# Patient Record
Sex: Female | Born: 1980 | Race: Black or African American | Hispanic: No | Marital: Single | Smoking: Never smoker
Health system: Southern US, Community
[De-identification: ages and names within clinical notes are randomized; demographics above are authoritative.]

## PROBLEM LIST (undated history)

## (undated) ENCOUNTER — Inpatient Hospital Stay (HOSPITAL_COMMUNITY): Payer: No Typology Code available for payment source

## (undated) ENCOUNTER — Inpatient Hospital Stay (HOSPITAL_COMMUNITY): Payer: Self-pay

## (undated) DIAGNOSIS — F329 Major depressive disorder, single episode, unspecified: Secondary | ICD-10-CM

## (undated) DIAGNOSIS — R87629 Unspecified abnormal cytological findings in specimens from vagina: Secondary | ICD-10-CM

## (undated) DIAGNOSIS — A609 Anogenital herpesviral infection, unspecified: Secondary | ICD-10-CM

## (undated) DIAGNOSIS — O24419 Gestational diabetes mellitus in pregnancy, unspecified control: Secondary | ICD-10-CM

## (undated) DIAGNOSIS — J45909 Unspecified asthma, uncomplicated: Secondary | ICD-10-CM

## (undated) DIAGNOSIS — R87619 Unspecified abnormal cytological findings in specimens from cervix uteri: Secondary | ICD-10-CM

## (undated) DIAGNOSIS — D649 Anemia, unspecified: Secondary | ICD-10-CM

## (undated) DIAGNOSIS — I10 Essential (primary) hypertension: Secondary | ICD-10-CM

## (undated) DIAGNOSIS — D219 Benign neoplasm of connective and other soft tissue, unspecified: Secondary | ICD-10-CM

## (undated) DIAGNOSIS — F32A Depression, unspecified: Secondary | ICD-10-CM

## (undated) DIAGNOSIS — IMO0002 Reserved for concepts with insufficient information to code with codable children: Secondary | ICD-10-CM

## (undated) DIAGNOSIS — Z8619 Personal history of other infectious and parasitic diseases: Secondary | ICD-10-CM

## (undated) DIAGNOSIS — R1110 Vomiting, unspecified: Secondary | ICD-10-CM

## (undated) DIAGNOSIS — A599 Trichomoniasis, unspecified: Secondary | ICD-10-CM

## (undated) DIAGNOSIS — J329 Chronic sinusitis, unspecified: Secondary | ICD-10-CM

## (undated) DIAGNOSIS — N390 Urinary tract infection, site not specified: Secondary | ICD-10-CM

## (undated) DIAGNOSIS — O24119 Pre-existing diabetes mellitus, type 2, in pregnancy, unspecified trimester: Secondary | ICD-10-CM

## (undated) HISTORY — DX: Gestational diabetes mellitus in pregnancy, unspecified control: O24.419

## (undated) HISTORY — DX: Vomiting, unspecified: R11.10

## (undated) HISTORY — PX: NO PAST SURGERIES: SHX2092

## (undated) HISTORY — DX: Anogenital herpesviral infection, unspecified: A60.9

## (undated) HISTORY — DX: Personal history of other infectious and parasitic diseases: Z86.19

## (undated) HISTORY — DX: Pre-existing type 2 diabetes mellitus, in pregnancy, unspecified trimester: O24.119

## (undated) HISTORY — DX: Benign neoplasm of connective and other soft tissue, unspecified: D21.9

## (undated) HISTORY — DX: Essential (primary) hypertension: I10

## (undated) HISTORY — DX: Anemia, unspecified: D64.9

## (undated) HISTORY — PX: BREAST SURGERY: SHX581

---

## 2010-09-02 NOTE — L&D Delivery Note (Signed)
Delivery Note At 2:47 AM a viable and healthy female delivered precipitously just after initial epidural dosing via Vaginal, Spontaneous Delivery (Presentation: ;  ), attended by nursing staff.   APGAR: 9, 9; weight 7 lb 2.1 oz (3235 g). Patient had unresponsive episode just after delivery, although vital signs remained stable, she had to be roused with ammonia capsule and sternal rub O2 by face mask.  I entered room just after beginning of this episode.  I requested Dr. Jean Rosenthal from anesthesia step in to assist in patient assessment--he viewed vital signs and felt patient was stable.  Episode seemed more psychogenic than physiologic.  Had increased bleeding just before placenta delivered--800 mcg cytotech placed per rectum just after delivery of placenta.  Uterus initially slightly boggy, then resolved to firm with massage, pitocin, and cytotech.    Placenta status: Intact, Spontaneous.  Cord: 3 vessels with the following complications: None.  Cord pH: NA  Vital signs remained stable before, during, and after the episode.  To recovery phase in good condition.  Nursing staff to notify Dr. Stefano Gaul of patient's delivery and status.  Anesthesia: Epidural  Episiotomy: None Lacerations: None Suture Repair: NA Est. Blood Loss (mL):   Mom to postpartum.  Baby to nursery-stable.  Roza Creamer 08/02/2011, 3:11 AM

## 2010-12-17 ENCOUNTER — Encounter (HOSPITAL_COMMUNITY): Payer: Self-pay | Admitting: Radiology

## 2010-12-17 ENCOUNTER — Inpatient Hospital Stay (HOSPITAL_COMMUNITY): Payer: Medicaid Other

## 2010-12-17 ENCOUNTER — Inpatient Hospital Stay (HOSPITAL_COMMUNITY)
Admission: AD | Admit: 2010-12-17 | Discharge: 2010-12-17 | Disposition: A | Discharge status: Home / Self Care | Payer: Medicaid Other | Source: Ambulatory Visit | Attending: Obstetrics and Gynecology | Admitting: Obstetrics and Gynecology

## 2010-12-17 DIAGNOSIS — O210 Mild hyperemesis gravidarum: Secondary | ICD-10-CM | POA: Insufficient documentation

## 2010-12-17 DIAGNOSIS — O9989 Other specified diseases and conditions complicating pregnancy, childbirth and the puerperium: Secondary | ICD-10-CM | POA: Insufficient documentation

## 2010-12-17 DIAGNOSIS — K117 Disturbances of salivary secretion: Secondary | ICD-10-CM | POA: Insufficient documentation

## 2010-12-17 DIAGNOSIS — Z3687 Encounter for antenatal screening for uncertain dates: Secondary | ICD-10-CM

## 2010-12-17 DIAGNOSIS — R112 Nausea with vomiting, unspecified: Secondary | ICD-10-CM

## 2010-12-17 LAB — URINALYSIS, ROUTINE W REFLEX MICROSCOPIC
Bilirubin Urine: NEGATIVE
Glucose, UA: NEGATIVE mg/dL
Hgb urine dipstick: NEGATIVE
Specific Gravity, Urine: 1.02 (ref 1.005–1.030)
Urobilinogen, UA: 0.2 mg/dL (ref 0.0–1.0)

## 2010-12-17 LAB — URINE MICROSCOPIC-ADD ON

## 2010-12-17 LAB — CBC
Hemoglobin: 12 g/dL (ref 12.0–15.0)
MCH: 29.4 pg (ref 26.0–34.0)
Platelets: 278 10*3/uL (ref 150–400)
RBC: 4.08 MIL/uL (ref 3.87–5.11)
WBC: 7.9 10*3/uL (ref 4.0–10.5)

## 2010-12-17 LAB — HCG, QUANTITATIVE, PREGNANCY: hCG, Beta Chain, Quant, S: 40627 m[IU]/mL — ABNORMAL HIGH (ref <5)

## 2011-01-14 LAB — GC/CHLAMYDIA PROBE AMP, GENITAL: Chlamydia: NEGATIVE

## 2011-01-14 LAB — RUBELLA ANTIBODY, IGM: Rubella: IMMUNE

## 2011-01-14 LAB — ABO/RH: RH Type: POSITIVE

## 2011-01-14 LAB — HEPATITIS B SURFACE ANTIGEN: Hepatitis B Surface Ag: NEGATIVE

## 2011-01-22 ENCOUNTER — Inpatient Hospital Stay (HOSPITAL_COMMUNITY)
Admission: AD | Admit: 2011-01-22 | Discharge: 2011-01-22 | Disposition: A | Discharge status: Home / Self Care | Payer: Medicaid Other | Source: Ambulatory Visit | Attending: Obstetrics and Gynecology | Admitting: Obstetrics and Gynecology

## 2011-01-22 DIAGNOSIS — O9989 Other specified diseases and conditions complicating pregnancy, childbirth and the puerperium: Secondary | ICD-10-CM | POA: Insufficient documentation

## 2011-01-22 DIAGNOSIS — K117 Disturbances of salivary secretion: Secondary | ICD-10-CM | POA: Insufficient documentation

## 2011-01-22 DIAGNOSIS — O210 Mild hyperemesis gravidarum: Secondary | ICD-10-CM | POA: Insufficient documentation

## 2011-01-22 LAB — URINALYSIS, ROUTINE W REFLEX MICROSCOPIC
Bilirubin Urine: NEGATIVE
Nitrite: NEGATIVE
Specific Gravity, Urine: 1.025 (ref 1.005–1.030)
Urobilinogen, UA: 0.2 mg/dL (ref 0.0–1.0)
pH: 6 (ref 5.0–8.0)

## 2011-01-22 LAB — COMPREHENSIVE METABOLIC PANEL
AST: 14 U/L (ref 0–37)
BUN: 7 mg/dL (ref 6–23)
CO2: 24 mEq/L (ref 19–32)
Calcium: 10.1 mg/dL (ref 8.4–10.5)
Chloride: 98 mEq/L (ref 96–112)
Creatinine, Ser: 0.5 mg/dL (ref 0.4–1.2)
GFR calc non Af Amer: >60 mL/min (ref >60)
Glucose, Bld: 85 mg/dL (ref 70–99)
Total Bilirubin: 0.3 mg/dL (ref 0.3–1.2)

## 2011-01-22 LAB — URINE MICROSCOPIC-ADD ON

## 2011-01-24 LAB — URINE CULTURE
Colony Count: 40000
Culture  Setup Time: 201205230622

## 2011-02-01 DIAGNOSIS — A599 Trichomoniasis, unspecified: Secondary | ICD-10-CM

## 2011-02-01 HISTORY — DX: Trichomoniasis, unspecified: A59.9

## 2011-02-02 ENCOUNTER — Inpatient Hospital Stay (HOSPITAL_COMMUNITY)
Admission: AD | Admit: 2011-02-02 | Discharge: 2011-02-02 | Disposition: A | Discharge status: Home / Self Care | Payer: Medicaid Other | Source: Ambulatory Visit | Attending: Obstetrics and Gynecology | Admitting: Obstetrics and Gynecology

## 2011-02-02 DIAGNOSIS — N949 Unspecified condition associated with female genital organs and menstrual cycle: Secondary | ICD-10-CM | POA: Insufficient documentation

## 2011-02-02 DIAGNOSIS — N390 Urinary tract infection, site not specified: Secondary | ICD-10-CM | POA: Insufficient documentation

## 2011-02-02 DIAGNOSIS — O2390 Unspecified genitourinary tract infection in pregnancy, unspecified trimester: Secondary | ICD-10-CM | POA: Insufficient documentation

## 2011-02-02 LAB — CBC
HCT: 34.8 % — ABNORMAL LOW (ref 36.0–46.0)
MCH: 29.5 pg (ref 26.0–34.0)
MCV: 86.1 fL (ref 78.0–100.0)
Platelets: 234 10*3/uL (ref 150–400)
RDW: 11.7 % (ref 11.5–15.5)
WBC: 6.3 10*3/uL (ref 4.0–10.5)

## 2011-02-02 LAB — URINALYSIS, ROUTINE W REFLEX MICROSCOPIC
Bilirubin Urine: NEGATIVE
Hgb urine dipstick: NEGATIVE
Ketones, ur: NEGATIVE mg/dL
Specific Gravity, Urine: >1.03 — ABNORMAL HIGH (ref 1.005–1.030)
pH: 6 (ref 5.0–8.0)

## 2011-02-04 LAB — URINE CULTURE: Colony Count: 40000

## 2011-02-05 ENCOUNTER — Inpatient Hospital Stay (HOSPITAL_COMMUNITY)
Admission: AD | Admit: 2011-02-05 | Payer: Medicaid Other | Source: Ambulatory Visit | Admitting: Obstetrics and Gynecology

## 2011-02-05 ENCOUNTER — Inpatient Hospital Stay (HOSPITAL_COMMUNITY)
Admission: AD | Admit: 2011-02-05 | Discharge: 2011-02-10 | DRG: 781 | Disposition: A | Discharge status: Home / Self Care | Payer: Medicaid Other | Source: Ambulatory Visit | Attending: Obstetrics and Gynecology | Admitting: Obstetrics and Gynecology

## 2011-02-05 DIAGNOSIS — O9989 Other specified diseases and conditions complicating pregnancy, childbirth and the puerperium: Secondary | ICD-10-CM | POA: Diagnosis present

## 2011-02-05 DIAGNOSIS — K117 Disturbances of salivary secretion: Secondary | ICD-10-CM | POA: Diagnosis present

## 2011-02-05 DIAGNOSIS — O210 Mild hyperemesis gravidarum: Principal | ICD-10-CM | POA: Diagnosis present

## 2011-02-05 LAB — COMPREHENSIVE METABOLIC PANEL
AST: 15 U/L (ref 0–37)
Albumin: 4 g/dL (ref 3.5–5.2)
BUN: 7 mg/dL (ref 6–23)
Calcium: 9.6 mg/dL (ref 8.4–10.5)
Creatinine, Ser: 0.59 mg/dL (ref 0.4–1.2)
GFR calc Af Amer: >60 mL/min (ref >60)
Total Protein: 7.9 g/dL (ref 6.0–8.3)

## 2011-02-05 LAB — URINE MICROSCOPIC-ADD ON

## 2011-02-05 LAB — URINALYSIS, ROUTINE W REFLEX MICROSCOPIC
Glucose, UA: >1000 mg/dL — AB
Hgb urine dipstick: NEGATIVE
Specific Gravity, Urine: 1.015 (ref 1.005–1.030)
Urobilinogen, UA: 0.2 mg/dL (ref 0.0–1.0)
pH: 8.5 — ABNORMAL HIGH (ref 5.0–8.0)

## 2011-02-05 LAB — PREALBUMIN: Prealbumin: 28 mg/dL (ref 17.0–34.0)

## 2011-02-08 LAB — GLUCOSE, CAPILLARY: Glucose-Capillary: 104 mg/dL — ABNORMAL HIGH (ref 70–99)

## 2011-02-09 LAB — BASIC METABOLIC PANEL
CO2: 24 mEq/L (ref 19–32)
Chloride: 102 mEq/L (ref 96–112)
Creatinine, Ser: 0.5 mg/dL (ref 0.4–1.2)
Glucose, Bld: 118 mg/dL — ABNORMAL HIGH (ref 70–99)

## 2011-02-10 LAB — GLUCOSE, CAPILLARY: Glucose-Capillary: 111 mg/dL — ABNORMAL HIGH (ref 70–99)

## 2011-03-07 ENCOUNTER — Inpatient Hospital Stay (HOSPITAL_COMMUNITY)
Admission: AD | Admit: 2011-03-07 | Discharge: 2011-03-08 | Disposition: A | Discharge status: Home / Self Care | Payer: Medicaid Other | Source: Ambulatory Visit | Attending: Obstetrics and Gynecology | Admitting: Obstetrics and Gynecology

## 2011-03-07 DIAGNOSIS — O210 Mild hyperemesis gravidarum: Secondary | ICD-10-CM | POA: Insufficient documentation

## 2011-03-07 LAB — URINALYSIS, ROUTINE W REFLEX MICROSCOPIC
Glucose, UA: NEGATIVE mg/dL
Hgb urine dipstick: NEGATIVE
Specific Gravity, Urine: 1.015 (ref 1.005–1.030)
Urobilinogen, UA: 0.2 mg/dL (ref 0.0–1.0)

## 2011-03-07 LAB — COMPREHENSIVE METABOLIC PANEL
ALT: 27 U/L (ref 0–35)
Alkaline Phosphatase: 44 U/L (ref 39–117)
CO2: 25 mEq/L (ref 19–32)
Chloride: 101 mEq/L (ref 96–112)
GFR calc Af Amer: >60 mL/min (ref >60)
GFR calc non Af Amer: >60 mL/min (ref >60)
Glucose, Bld: 92 mg/dL (ref 70–99)
Potassium: 3.7 mEq/L (ref 3.5–5.1)
Sodium: 135 mEq/L (ref 135–145)
Total Bilirubin: 0.1 mg/dL — ABNORMAL LOW (ref 0.3–1.2)

## 2011-03-07 LAB — CBC
Hemoglobin: 11.4 g/dL — ABNORMAL LOW (ref 12.0–15.0)
MCH: 29.9 pg (ref 26.0–34.0)
RBC: 3.81 MIL/uL — ABNORMAL LOW (ref 3.87–5.11)
WBC: 7 10*3/uL (ref 4.0–10.5)

## 2011-03-07 LAB — DIFFERENTIAL
Basophils Relative: 0 % (ref 0–1)
Monocytes Relative: 7 % (ref 3–12)
Neutro Abs: 4.3 10*3/uL (ref 1.7–7.7)
Neutrophils Relative %: 62 % (ref 43–77)

## 2011-03-13 ENCOUNTER — Encounter: Payer: Medicaid Other | Attending: Obstetrics and Gynecology

## 2011-03-13 DIAGNOSIS — O99810 Abnormal glucose complicating pregnancy: Secondary | ICD-10-CM | POA: Insufficient documentation

## 2011-03-13 DIAGNOSIS — Z713 Dietary counseling and surveillance: Secondary | ICD-10-CM | POA: Insufficient documentation

## 2011-03-14 NOTE — Progress Notes (Signed)
  Patient was seen on 03/13/2011 for Gestational Diabetes self-management class at the [facility]. The following learning objectives were met by the patient during this course:   States the definition of Gestational Diabetes  States why dietary management is important in controlling blood glucose  Describes the effects each nutrient has on blood glucose levels  Demonstrates ability to create a balanced meal plan  Demonstrates carbohydrate counting   States when to check blood glucose levels  Demonstrates proper blood glucose monitoring techniques  States the effect of stress and exercise on blood glucose levels  States the importance of limiting caffeine and abstaining from alcohol and smoking  Blood glucose monitor given: Accu-Chek-Nano Lot # [id number] Exp: 07/02/2012 Blood glucose reading: 1o8  Patient instructed to monitor glucose levels: FBS: 60 - <90 2 hour: <120  Patient will be seen for follow-up as needed.

## 2011-05-02 ENCOUNTER — Inpatient Hospital Stay (HOSPITAL_COMMUNITY)
Admission: AD | Admit: 2011-05-02 | Discharge: 2011-05-02 | Disposition: A | Discharge status: Home / Self Care | Payer: Medicaid Other | Source: Ambulatory Visit | Attending: Obstetrics and Gynecology | Admitting: Obstetrics and Gynecology

## 2011-05-02 ENCOUNTER — Encounter (HOSPITAL_COMMUNITY): Payer: Self-pay | Admitting: *Deleted

## 2011-05-02 ENCOUNTER — Inpatient Hospital Stay (HOSPITAL_COMMUNITY): Payer: Medicaid Other

## 2011-05-02 DIAGNOSIS — Y9229 Other specified public building as the place of occurrence of the external cause: Secondary | ICD-10-CM | POA: Insufficient documentation

## 2011-05-02 DIAGNOSIS — IMO0002 Reserved for concepts with insufficient information to code with codable children: Secondary | ICD-10-CM | POA: Insufficient documentation

## 2011-05-02 DIAGNOSIS — S3981XA Other specified injuries of abdomen, initial encounter: Secondary | ICD-10-CM | POA: Insufficient documentation

## 2011-05-02 DIAGNOSIS — O99810 Abnormal glucose complicating pregnancy: Secondary | ICD-10-CM | POA: Insufficient documentation

## 2011-05-02 DIAGNOSIS — Y998 Other external cause status: Secondary | ICD-10-CM | POA: Insufficient documentation

## 2011-05-02 HISTORY — DX: Gestational diabetes mellitus in pregnancy, unspecified control: O24.419

## 2011-05-02 HISTORY — DX: Trichomoniasis, unspecified: A59.9

## 2011-05-02 HISTORY — DX: Urinary tract infection, site not specified: N39.0

## 2011-05-02 LAB — URINE MICROSCOPIC-ADD ON

## 2011-05-02 LAB — URINALYSIS, ROUTINE W REFLEX MICROSCOPIC
Bilirubin Urine: NEGATIVE
Glucose, UA: NEGATIVE mg/dL
Hgb urine dipstick: NEGATIVE
Protein, ur: NEGATIVE mg/dL
Urobilinogen, UA: 0.2 mg/dL (ref 0.0–1.0)

## 2011-05-02 LAB — GLUCOSE, CAPILLARY: Glucose-Capillary: 132 mg/dL — ABNORMAL HIGH (ref 70–99)

## 2011-05-02 MED ORDER — IBUPROFEN 800 MG PO TABS
800.0000 mg | ORAL_TABLET | Freq: Once | ORAL | Status: AC
  Administered 2011-05-02: 800 mg via ORAL
  Filled 2011-05-02: qty 1

## 2011-05-02 NOTE — ED Notes (Signed)
Diet ordered 
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
## 2011-06-06 NOTE — Discharge Summary (Signed)
[name], [name] NO.:  [id number]  MEDICAL RECORD NO.:  [id number]  LOCATION:  9320                          FACILITY:  [facility]  PHYSICIAN:  [name], M.D. DATE OF BIRTH:  Apr 25, 1981  DATE OF ADMISSION:  02/05/2011 DATE OF DISCHARGE:  02/10/2011                              DISCHARGE SUMMARY   ADMITTING DIAGNOSES: 1. The patient is a 30 year old gravida 2, para 1-0-0-1 at 13 weeks 2. Exacerbation of hyperemesis. 3. Pytalism. 4. Preterm contractions  DISCHARGE DIAGNOSES: 1. Exacerbation of hyperemesis. 2. Pytalism.  HOSPITAL COURSE:  The patient was admitted to the women's floor.  She received IV fluids and antiemetics.  She also received a steroid taper.  Diet was originally n.p.o. and the patient advanced to clears and eventually to a regular diet that she is tolerating.  Antiemetics and IV fluids were helping.  LABORATORY DATA:  On admission, sodium was 128, potassium 3.3, all other levels of CMP were normal.  On February 09, 2011, sodium was 135 and potassium was 3.5.  All other levels were essentially normal.  She also had a TSH level drawn which was 1.055.  Prealbumin level that was 28, it was done on February 05, 2011 and also on February 05, 2011 she had a urinalysis that was essentially normal.  The patient had improvement every day and began to tolerate liquids and regular diet and had less nausea, vomiting, and pytalim as well as labs improving.  Vital signs remained stable.  There were no complications.  DISCHARGE DIAGNOSES: Exacerbation of hyperemesis improved and pytalism improved and the patient was discharged on February 10, 2011 in stable condition.  DISCHARGE INSTRUCTIONS:  Continue Protonix 40 mg once daily, Phenergan rectal suppository 25 mg every 6 hours as needed, Fioricet every 12 hours as needed for headaches, glycopyrrolate 1 mg daily, Zofran 8 mg every 6 hours as needed, Macrobid 1 tablet daily for 14 days.  The patient is to continue  up to 14 days.  She was on day 9 of her course.  New medications are Reglan 10 mg by mouth every 8 hours and steroid taper to be completed as written.  She is also to receive Colace b.i.d.  She is to follow up in 2 weeks as scheduled by [facility].    ______________________________ Sanda Klein, CNM   ______________________________ [name], M.D.    SL/MEDQ  D:  06/06/2011  T:  06/06/2011  Job:  [id number]

## 2011-06-10 ENCOUNTER — Other Ambulatory Visit: Payer: Self-pay | Admitting: Obstetrics and Gynecology

## 2011-06-10 ENCOUNTER — Inpatient Hospital Stay (HOSPITAL_COMMUNITY): Payer: Medicaid Other

## 2011-06-10 ENCOUNTER — Encounter (HOSPITAL_COMMUNITY): Payer: Self-pay | Admitting: *Deleted

## 2011-06-10 ENCOUNTER — Inpatient Hospital Stay (HOSPITAL_COMMUNITY)
Admission: AD | Admit: 2011-06-10 | Discharge: 2011-06-11 | DRG: 781 | Disposition: A | Discharge status: Home / Self Care | Payer: Medicaid Other | Source: Ambulatory Visit | Attending: Obstetrics and Gynecology | Admitting: Obstetrics and Gynecology

## 2011-06-10 DIAGNOSIS — O24919 Unspecified diabetes mellitus in pregnancy, unspecified trimester: Secondary | ICD-10-CM | POA: Diagnosis present

## 2011-06-10 DIAGNOSIS — O368390 Maternal care for abnormalities of the fetal heart rate or rhythm, unspecified trimester, not applicable or unspecified: Secondary | ICD-10-CM | POA: Diagnosis present

## 2011-06-10 DIAGNOSIS — E119 Type 2 diabetes mellitus without complications: Secondary | ICD-10-CM | POA: Diagnosis present

## 2011-06-10 DIAGNOSIS — O212 Late vomiting of pregnancy: Secondary | ICD-10-CM | POA: Diagnosis present

## 2011-06-10 DIAGNOSIS — O99891 Other specified diseases and conditions complicating pregnancy: Principal | ICD-10-CM | POA: Diagnosis present

## 2011-06-10 DIAGNOSIS — B9789 Other viral agents as the cause of diseases classified elsewhere: Secondary | ICD-10-CM | POA: Diagnosis present

## 2011-06-10 DIAGNOSIS — B349 Viral infection, unspecified: Secondary | ICD-10-CM

## 2011-06-10 HISTORY — DX: Chronic sinusitis, unspecified: J32.9

## 2011-06-10 LAB — CBC
HCT: 34.9 % — ABNORMAL LOW (ref 36.0–46.0)
Hemoglobin: 11.8 g/dL — ABNORMAL LOW (ref 12.0–15.0)
MCH: 29.3 pg (ref 26.0–34.0)
MCHC: 33.8 g/dL (ref 30.0–36.0)
MCV: 86.6 fL (ref 78.0–100.0)
Platelets: 235 10*3/uL (ref 150–400)
RBC: 4.03 MIL/uL (ref 3.87–5.11)
RDW: 11.8 % (ref 11.5–15.5)
WBC: 4.6 10*3/uL (ref 4.0–10.5)

## 2011-06-10 LAB — COMPREHENSIVE METABOLIC PANEL
ALT: 14 U/L (ref 0–35)
AST: 19 U/L (ref 0–37)
Albumin: 3.3 g/dL — ABNORMAL LOW (ref 3.5–5.2)
Calcium: 9.8 mg/dL (ref 8.4–10.5)
Chloride: 102 mEq/L (ref 96–112)
Creatinine, Ser: <0.47 mg/dL — ABNORMAL LOW (ref 0.50–1.10)
Sodium: 134 mEq/L — ABNORMAL LOW (ref 135–145)
Total Bilirubin: 0.2 mg/dL — ABNORMAL LOW (ref 0.3–1.2)

## 2011-06-10 LAB — URINE MICROSCOPIC-ADD ON

## 2011-06-10 LAB — URINALYSIS, ROUTINE W REFLEX MICROSCOPIC
Glucose, UA: NEGATIVE mg/dL
Hgb urine dipstick: NEGATIVE
Specific Gravity, Urine: >1.03 — ABNORMAL HIGH (ref 1.005–1.030)

## 2011-06-10 LAB — GLUCOSE, CAPILLARY: Glucose-Capillary: 86 mg/dL (ref 70–99)

## 2011-06-10 MED ORDER — LACTATED RINGERS IV SOLN
Freq: Once | INTRAVENOUS | Status: AC
  Administered 2011-06-10: 10:00:00 via INTRAVENOUS

## 2011-06-10 MED ORDER — GLYCOPYRROLATE 0.2 MG/ML IJ SOLN
0.1000 mg | Freq: Once | INTRAMUSCULAR | Status: AC
  Administered 2011-06-10: 0.1 mg via INTRAVENOUS
  Filled 2011-06-10: qty 0.5

## 2011-06-10 MED ORDER — ONDANSETRON HCL 4 MG/2ML IJ SOLN
4.0000 mg | Freq: Three times a day (TID) | INTRAMUSCULAR | Status: DC
  Administered 2011-06-10 – 2011-06-11 (×4): 4 mg via INTRAVENOUS
  Filled 2011-06-10 (×4): qty 2

## 2011-06-10 MED ORDER — PANTOPRAZOLE SODIUM 40 MG IV SOLR
40.0000 mg | INTRAVENOUS | Status: DC
  Administered 2011-06-10 – 2011-06-11 (×2): 40 mg via INTRAVENOUS
  Filled 2011-06-10 (×4): qty 40

## 2011-06-10 MED ORDER — ACETAMINOPHEN 325 MG PO TABS: 650.0000 mg | ORAL_TABLET | ORAL | Status: DC | PRN

## 2011-06-10 MED ORDER — GI COCKTAIL ~~LOC~~
30.0000 mL | Freq: Three times a day (TID) | ORAL | Status: DC | PRN
  Administered 2011-06-10 – 2011-06-11 (×2): 30 mL via ORAL
  Filled 2011-06-10 (×3): qty 30

## 2011-06-10 MED ORDER — LACTATED RINGERS IV SOLN
INTRAVENOUS | Status: DC
  Administered 2011-06-10 – 2011-06-11 (×4): via INTRAVENOUS

## 2011-06-10 MED ORDER — PRENATAL PLUS 27-1 MG PO TABS
1.0000 | ORAL_TABLET | Freq: Every day | ORAL | Status: DC
  Filled 2011-06-10 (×3): qty 1

## 2011-06-10 MED ORDER — DOCUSATE SODIUM 100 MG PO CAPS
100.0000 mg | ORAL_CAPSULE | Freq: Every day | ORAL | Status: DC
Start: 2011-06-10 — End: 2011-06-11
  Administered 2011-06-10: 100 mg via ORAL
  Filled 2011-06-10 (×4): qty 1

## 2011-06-10 MED ORDER — PROMETHAZINE HCL 25 MG/ML IJ SOLN
25.0000 mg | Freq: Four times a day (QID) | INTRAMUSCULAR | Status: DC | PRN
  Administered 2011-06-10 (×2): 25 mg via INTRAVENOUS
  Filled 2011-06-10 (×2): qty 1

## 2011-06-10 MED ORDER — ZOLPIDEM TARTRATE 10 MG PO TABS: 10.0000 mg | ORAL_TABLET | Freq: Every evening | ORAL | Status: DC | PRN

## 2011-06-10 MED ORDER — CALCIUM CARBONATE ANTACID 500 MG PO CHEW
2.0000 | CHEWABLE_TABLET | ORAL | Status: DC | PRN
  Administered 2011-06-11: 400 mg via ORAL
  Filled 2011-06-10 (×2): qty 2

## 2011-06-10 NOTE — Progress Notes (Signed)
Pt states has had n/v since 0500 this am, yellow emesis noted, had hyperemesis earlier in pregnancy, +FM, denies diarrhea, had baby shower Saturday, around a lot of people, denies vag d/c changes or bleeding, +FM.

## 2011-06-10 NOTE — Plan of Care (Signed)
Problem: Consults Goal: Birthing Suites Patient Information Press F2 to bring up selections list  Outcome: Not Applicable Date Met:  06/10/11  Pt < [redacted] weeks EGA

## 2011-06-10 NOTE — ED Provider Notes (Signed)
History    30 yo G2p1001 at 58 1/7 weeks presented with onset of nausea and vomiting at 5 am today.  Has struggled with N/V throughout the pregnancy, although last few weeks have been better.  She has Zofran and Phenergan for home use, but threw up the Zofran, and had no help from the Phenergan.  Denies fever, dysuria, bleeding, leaking, contractions, or known viral exposure, although she did have a baby shower this weekend. CBG this am 101.  Pregnancy remarkable for: Probable Type 2 Diabetes, with elevated HgA1C this pregnancy--currently monitoring CBGs at home, but on no medication at present Questionable LMP Obese  Trich on Pap Early pregnancy hyperemesis  Ptyalism  OB History    Grav Para Term Preterm Abortions TAB SAB Ect Mult Living   2 1 1  0 0 0 0 0 0 1      Past Medical History  Diagnosis Date  . Gestational diabetes   . Urinary tract infection   . Trichomonas 02/2011    treated    No past surgical history on file.  No family history on file.  History  Substance Use Topics  . Smoking status: Never Smoker   . Smokeless tobacco: Never Used  . Alcohol Use: No     Allergies  Allergen Reactions  . Flagyl (Metronidazole Hcl) Swelling  . Latex Hives    Prescriptions prior to admission  Medication Sig Dispense Refill  . glycopyrrolate (ROBINUL) 1 MG tablet Take 1 mg by mouth 3 (three) times daily.        . Multiple Vitamin (MULTIVITAMIN) tablet Take 1 tablet by mouth daily.        . ondansetron (ZOFRAN) 8 MG tablet Take 8 mg by mouth every 8 (eight) hours as needed.        . pantoprazole (PROTONIX) 40 MG tablet Take 40 mg by mouth daily.        . promethazine (PHENERGAN) 25 MG tablet Take 25 mg by mouth every 6 (six) hours as needed.          ROS:  Nausea, vomiting, reflux, HA.  Denies abdominal pain, d/c, bleeding.  Reports +FM. Physical Exam   Blood pressure 128/84, pulse 98, temperature 98 F (36.7 C), temperature source Oral, resp. rate 18, height 5\' 3"   (1.6 m), weight 99.961 kg (220 lb 6 oz).  Appears fatigued and ill Chest clear Heart RRR without murmur Abd Gravid, NT Negative CVAT FHR overall non-reactive at present, no decel Very occasional contraction. Ext WNL  ED Course  IUP at 31 1/7 weeks Nausea/vomiting--? Recurrence of hyperemesis vs. Viral syndrome Type 2 diabetes--no current meds  Plan: Check UA, CBC, CMP IV hydration with LR Zofran Robinul Protonix Monitor Consult with Dr. Normand Sloop after labs returned.  Nigel Bridgeman, CNM 06/10/11 1020

## 2011-06-10 NOTE — Progress Notes (Signed)
Now in 160.  Resting comfortably. FHR still non-reactive, baseline 150-160. BPP 6/8 (- 2 for no sustained FBM).  Filed Vitals:   06/10/11 1551  BP: 112/65  Pulse: 98  Temp: 97.7 F (36.5 C)  Resp: 18   Will continue IV hydration and continuous monitoring. Offer clear liquids as initial diet, if no further vomiting occurs.  Nigel Bridgeman, CNM 06/10/11  1635

## 2011-06-10 NOTE — H&P (Signed)
30 yo G2p1001 at 28 1/7 weeks presented with onset of nausea and vomiting at 5 am today. Has struggled with N/V throughout the pregnancy, although last few weeks have been better. She has Zofran and Phenergan for home use, but threw up the Zofran, and had no help from the Phenergan. Denies fever, dysuria, bleeding, leaking, contractions, or known viral exposure, although she did have a baby shower this weekend. CBG this am 101.   Pregnancy remarkable for:  Probable Type 2 Diabetes, with elevated HgA1C this pregnancy--currently monitoring CBGs at home, but on no medication at present  Questionable LMP  Obese  Trich on Pap  Early pregnancy hyperemesis  Ptyalism   OB History    Grav  Para  Term  Preterm  Abortions  TAB  SAB  Ect  Mult  Living    2  1  1   0  0  0  0  0  0  1      Past Medical History   Diagnosis  Date   .  Gestational diabetes    .  Urinary tract infection    .  Trichomonas  02/2011     treated    No past surgical history on file.  No family history on file.  History   Substance Use Topics   .  Smoking status:  Never Smoker   .  Smokeless tobacco:  Never Used   .  Alcohol Use:  No    Allergies   Allergen  Reactions   .  Flagyl (Metronidazole Hcl)  Swelling   .  Latex  Hives    Prescriptions prior to admission   Medication  Sig  Dispense  Refill   .  glycopyrrolate (ROBINUL) 1 MG tablet  Take 1 mg by mouth 3 (three) times daily.     .  Multiple Vitamin (MULTIVITAMIN) tablet  Take 1 tablet by mouth daily.     .  ondansetron (ZOFRAN) 8 MG tablet  Take 8 mg by mouth every 8 (eight) hours as needed.     .  pantoprazole (PROTONIX) 40 MG tablet  Take 40 mg by mouth daily.     .  promethazine (PHENERGAN) 25 MG tablet  Take 25 mg by mouth every 6 (six) hours as needed.      ROS: Nausea, vomiting, reflux, HA. Denies abdominal pain, d/c, bleeding. Reports +FM.  Physical Exam   Blood pressure 128/84, pulse 98, temperature 98 F (36.7 C), temperature source Oral, resp.  rate 18, height 5\' 3"  (1.6 m), weight 99.961 kg (220 lb 6 oz).  Appears fatigued and ill  Chest clear  Heart RRR without murmur  Abd Gravid, NT  Negative CVAT  FHR overall non-reactive at present, no decel  Very occasional contraction.  Ext WNL Cervix closed, long, vtx ballotable.  Results for orders placed during the hospital encounter of 06/10/11 (from the past 24 hour(s))  URINALYSIS, ROUTINE W REFLEX MICROSCOPIC     Status: Abnormal   Collection Time   06/10/11  9:38 AM      Component Value Range   Color, Urine YELLOW  YELLOW    Appearance HAZY (*) CLEAR    Specific Gravity, Urine >1.030 (*) 1.005 - 1.030    pH 6.5  5.0 - 8.0    Glucose, UA NEGATIVE  NEGATIVE (mg/dL)   Hgb urine dipstick NEGATIVE  NEGATIVE    Bilirubin Urine NEGATIVE  NEGATIVE    Ketones, ur NEGATIVE  NEGATIVE (mg/dL)   Protein,  ur NEGATIVE  NEGATIVE (mg/dL)   Urobilinogen, UA 1.0  0.0 - 1.0 (mg/dL)   Nitrite NEGATIVE  NEGATIVE    Leukocytes, UA TRACE (*) NEGATIVE   URINE MICROSCOPIC-ADD ON     Status: Abnormal   Collection Time   06/10/11  9:38 AM      Component Value Range   Squamous Epithelial / LPF FEW (*) RARE    WBC, UA 3-6  <3 (WBC/hpf)   Bacteria, UA FEW (*) RARE    Crystals CA OXALATE CRYSTALS (*) NEGATIVE   CBC     Status: Abnormal   Collection Time   06/10/11 10:14 AM      Component Value Range   WBC 4.6  4.0 - 10.5 (K/uL)   RBC 4.03  3.87 - 5.11 (MIL/uL)   Hemoglobin 11.8 (*) 12.0 - 15.0 (g/dL)   HCT 47.8 (*) 29.5 - 46.0 (%)   MCV 86.6  78.0 - 100.0 (fL)   MCH 29.3  26.0 - 34.0 (pg)   MCHC 33.8  30.0 - 36.0 (g/dL)   RDW 62.1  30.8 - 65.7 (%)   Platelets 235  150 - 400 (K/uL)  COMPREHENSIVE METABOLIC PANEL     Status: Abnormal   Collection Time   06/10/11 10:14 AM      Component Value Range   Sodium 134 (*) 135 - 145 (mEq/L)   Potassium 4.0  3.5 - 5.1 (mEq/L)   Chloride 102  96 - 112 (mEq/L)   CO2 21  19 - 32 (mEq/L)   Glucose, Bld 89  70 - 99 (mg/dL)   BUN 7  6 - 23 (mg/dL)    Creatinine, Ser <8.46 (*) 0.50 - 1.10 (mg/dL)   Calcium 9.8  8.4 - 96.2 (mg/dL)   Total Protein 6.7  6.0 - 8.3 (g/dL)   Albumin 3.3 (*) 3.5 - 5.2 (g/dL)   AST 19  0 - 37 (U/L)   ALT 14  0 - 35 (U/L)   Alkaline Phosphatase 67  39 - 117 (U/L)   Total Bilirubin 0.2 (*) 0.3 - 1.2 (mg/dL)   GFR calc non Af Amer NOT CALCULATED  >90 (mL/min)   GFR calc Af Amer NOT CALCULATED  >90 (mL/min)   Received Zofran, Protonix, Robinul, IV hydration. Still feels ill and nauseated. Single episode loose BM.  Impression/Plan: IUP at 31 1/7 weeks Probable viral syndrome Type 2 diabetes Non-reactive FHR  Will admit for observation per consult with Dr. Normand Sloop Continue IV hydration, anti-emetics, reflux meds Check BPP FBS and 2 hour PCs if eating, q 4 hours if not. May change to q shift monitoring if BPP reassuring. Daily weights Stool for c. Difficle, O&P if diarrhea occurs.  Nigel Bridgeman, CNM 06/10/11 1435

## 2011-06-10 NOTE — Progress Notes (Addendum)
  Subjective: Tolerated a clear liquid diet for dinner without active vomiting, still reports nausea.   Requested Phenergan before bed.  Objective: BP 109/62  Pulse 91  Temp(Src) 97.7 F (36.5 C) (Oral)  Resp 18  Ht 5\' 3"  (1.6 m)  Wt 99.961 kg (220 lb 6 oz)  BMI 39.04 kg/m2      FHT:  FHR: 150-160 bpm, variability: minimal ,  accelerations:  Abscent,  decelerations:  Absent UC:   None  CBG 86 at 2108  Assessment / Plan: Nausea and vomiting, ? viral syndrome Non-reactive NST, with BPP 6/8  Plan: Continue to monitor throughout night--if has segments of reactivity, will change to TID monitoring for sleep. Advance diet as tolerated Re-evaluate in am or prn. Per consult with Dr. Normand Sloop, plan repeat BPP in am.   Nigel Bridgeman 06/10/2011, 9:11 PM

## 2011-06-11 ENCOUNTER — Inpatient Hospital Stay (HOSPITAL_COMMUNITY): Payer: Medicaid Other

## 2011-06-11 MED ORDER — FAMOTIDINE 20 MG PO TABS
20.0000 mg | ORAL_TABLET | Freq: Two times a day (BID) | ORAL | Status: DC
  Administered 2011-06-11: 20 mg via ORAL
  Filled 2011-06-11: qty 1

## 2011-06-11 MED ORDER — RANITIDINE HCL 150 MG/10ML PO SYRP: 150.0000 mg | ORAL_SOLUTION | Freq: Two times a day (BID) | ORAL | Status: AC | PRN

## 2011-06-11 MED ORDER — CALCIUM CARBONATE ANTACID 500 MG PO CHEW: 2.0000 | CHEWABLE_TABLET | Freq: Three times a day (TID) | ORAL | Status: DC

## 2011-06-11 MED ORDER — RANITIDINE HCL 150 MG/10ML PO SYRP: 150.0000 mg | ORAL_SOLUTION | Freq: Two times a day (BID) | ORAL | Status: DC

## 2011-06-11 NOTE — Progress Notes (Signed)
Kalah Pflum is a 30 y.o. G2P1001 at [redacted]w[redacted]d admitted for n/v/d.  Subjective: Pt c/o GERD.  She tolerated po last night and is requesting bland diet today.  Reports pos FM, no VB, no abnormal d/c, no LOF and no contractions.   Objective: BP 108/52  Pulse 78  Temp(Src) 97.7 F (36.5 C) (Oral)  Resp 18  Ht 5\' 3"  (1.6 m)  Wt 99.961 kg (220 lb 6 oz)  BMI 39.04 kg/m2      FHT:  FHR: 140s bpm, variability: moderate,  accelerations:  Present (occas),  decelerations:  Absent UC:   none SVE:   Dilation: Closed Effacement (%): Thick Exam by:: Manfred Arch CNM  BPP 8/8  Labs: Lab Results  Component Value Date   WBC 4.6 06/10/2011   HGB 11.8* 06/10/2011   HCT 34.9* 06/10/2011   MCV 86.6 06/10/2011   PLT 235 06/10/2011    Assessment / Plan: 29yo G2P1 at 14 2/7wks admitted with N/V/D currently stable.  Pt is tolerating po but wants bland diet.  Pt wants to try other meds to see if they will work better for her GERD.  Will try zantac, gi cocktail and tums.  If provides relief, pt wants to go home.  Fetal status is reassuring.  Will f/u in office tomorrow.   Kaimana Lurz Y 06/11/2011, 2:07 PM

## 2011-06-11 NOTE — Discharge Summary (Signed)
Obstetric Discharge Summary Reason for Admission: n/v/d Prenatal Procedures: ultrasound, hydration and meds Intrapartum Procedures: n/a Postpartum Procedures: n/a Complications-Operative and Postpartum: n/a Hemoglobin  Date Value Range Status  06/10/2011 11.8* 12.0-15.0 (g/dL) Final     HCT  Date Value Range Status  06/10/2011 34.9* 36.0-46.0 (%) Final    Discharge Diagnoses: 31wks and improved (per patient) with no further diarrhea and tolerating po.  Discharge Information: Date: 06/11/2011 Activity: unrestricted Diet: Bland and advance as tolerated Medications: PNV and tums and zantac prn Condition: improved Instructions: discussed with patient, call with worsening symptoms, FKCs Discharge to: home   Newborn Data: This patient has no babies on file. Home with n/a.  Purcell Nails 06/11/2011, 2:13 PM

## 2011-06-11 NOTE — Progress Notes (Signed)
UR Chart review completed.  

## 2011-07-05 ENCOUNTER — Encounter (HOSPITAL_COMMUNITY): Payer: Self-pay | Admitting: *Deleted

## 2011-07-05 ENCOUNTER — Inpatient Hospital Stay (HOSPITAL_COMMUNITY)
Admission: AD | Admit: 2011-07-05 | Discharge: 2011-07-05 | Disposition: A | Discharge status: Home / Self Care | Payer: Medicaid Other | Source: Ambulatory Visit | Attending: Obstetrics and Gynecology | Admitting: Obstetrics and Gynecology

## 2011-07-05 DIAGNOSIS — O99810 Abnormal glucose complicating pregnancy: Secondary | ICD-10-CM | POA: Insufficient documentation

## 2011-07-05 LAB — COMPREHENSIVE METABOLIC PANEL
ALT: 11 U/L (ref 0–35)
AST: 15 U/L (ref 0–37)
Albumin: 3.2 g/dL — ABNORMAL LOW (ref 3.5–5.2)
Alkaline Phosphatase: 89 U/L (ref 39–117)
BUN: 9 mg/dL (ref 6–23)
Chloride: 102 mEq/L (ref 96–112)
Potassium: 3.6 mEq/L (ref 3.5–5.1)
Total Bilirubin: 0.2 mg/dL — ABNORMAL LOW (ref 0.3–1.2)

## 2011-07-05 LAB — URINALYSIS, ROUTINE W REFLEX MICROSCOPIC
Bilirubin Urine: NEGATIVE
Glucose, UA: NEGATIVE mg/dL
Hgb urine dipstick: NEGATIVE
Ketones, ur: 15 mg/dL — AB
Nitrite: NEGATIVE
Protein, ur: NEGATIVE mg/dL
Specific Gravity, Urine: 1.02 (ref 1.005–1.030)
Urobilinogen, UA: 1 mg/dL (ref 0.0–1.0)
pH: 7 (ref 5.0–8.0)

## 2011-07-05 LAB — URINE MICROSCOPIC-ADD ON

## 2011-07-05 LAB — CBC
HCT: 35.4 % — ABNORMAL LOW (ref 36.0–46.0)
RDW: 12 % (ref 11.5–15.5)
WBC: 5.8 10*3/uL (ref 4.0–10.5)

## 2011-07-05 MED ORDER — HYDROXYZINE HCL 50 MG PO TABS
50.0000 mg | ORAL_TABLET | Freq: Once | ORAL | Status: AC
  Administered 2011-07-05: 50 mg via ORAL
  Filled 2011-07-05: qty 1

## 2011-07-05 MED ORDER — HYDROXYZINE PAMOATE 50 MG PO CAPS: 50.0000 mg | ORAL_CAPSULE | Freq: Four times a day (QID) | ORAL | Status: AC | PRN

## 2011-07-05 MED ORDER — ACETAMINOPHEN 500 MG PO TABS
1000.0000 mg | ORAL_TABLET | Freq: Once | ORAL | Status: AC
  Administered 2011-07-05: 1000 mg via ORAL
  Filled 2011-07-05: qty 2

## 2011-07-05 MED ORDER — OXYCODONE HCL 5 MG PO TABS
10.0000 mg | ORAL_TABLET | Freq: Once | ORAL | Status: AC
Start: 2011-07-05 — End: 2011-07-05
  Administered 2011-07-05: 10 mg via ORAL
  Filled 2011-07-05: qty 2

## 2011-07-05 MED ORDER — ONDANSETRON 8 MG PO TBDP
8.0000 mg | ORAL_TABLET | Freq: Once | ORAL | Status: AC
  Administered 2011-07-05: 8 mg via ORAL
  Filled 2011-07-05: qty 1

## 2011-07-05 NOTE — Progress Notes (Signed)
Had flu shot yesterday 0800, oral pill changed for diabetis. Took first one this morning.  Diarrhea started yesterday early in day, continued all day.  Started getting HA last night. Woke up sugar was 88, ate typical breakfast.  HA continued. Took nap, rechecked sugar- it was 210, felt short of breath.  HA still present.

## 2011-07-05 NOTE — Progress Notes (Signed)
Feels hot and cold,

## 2011-07-05 NOTE — ED Provider Notes (Signed)
History     Chief Complaint  Patient presents with  . Headache   HPI Comments: Pt is a G2P1 at [redacted]w[redacted]d that arrived with c/o severe frontal HA, diarrhea yesterday, (none now), also took her blood sugar after eating at about 1100 and it was 210. Pt is crying and shaking, says she feels hot and cold, appears to be having panic attack. Denies anxiety. 2 adult visitors and 2 children at [facility], children are loud and crying. Pt denies ctx, VB or LOF, +FM  Pregnancy significant for: 1. GDM this preg - started on glyburide, hx GDM  2. Hx trich 3. Ptyalism 4. Hyperemesis Took glyburide after eating, has not eaten since about 9am  Headache  This is a new problem. The current episode started today. The problem occurs constantly. The problem has been gradually worsening. The pain is located in the frontal region. The pain does not radiate. The pain quality is not similar to prior headaches. The quality of the pain is described as stabbing and throbbing. The pain is at a severity of 10/10. The pain is severe. Associated symptoms include blurred vision, dizziness and photophobia. Pertinent negatives include no abdominal pain. The symptoms are aggravated by bright light. She has tried nothing for the symptoms.      Past Medical History  Diagnosis Date  . Gestational diabetes   . Urinary tract infection   . Trichomonas 02/2011    treated  . Sinusitis     Past Surgical History  Procedure Date  . No past surgeries     No family history on file.  History  Substance Use Topics  . Smoking status: Never Smoker   . Smokeless tobacco: Never Used  . Alcohol Use: No    Allergies:  Allergies  Allergen Reactions  . Flagyl (Metronidazole Hcl) Swelling  . Latex Hives    Prescriptions prior to admission  Medication Sig Dispense Refill  . glyBURIDE (DIABETA) 2.5 MG tablet Take 2.5 mg by mouth daily with breakfast.        . glycopyrrolate (ROBINUL) 1 MG tablet Take 1 mg by mouth 3 (three) times  daily.        . Multiple Vitamin (MULTIVITAMIN) tablet Take 1 tablet by mouth daily.        . ondansetron (ZOFRAN) 8 MG tablet Take 8 mg by mouth every 8 (eight) hours as needed. Nausea and vomiting      . promethazine (PHENERGAN) 25 MG tablet Take 25 mg by mouth every 6 (six) hours as needed. Nausea and vomiting      . ranitidine (ZANTAC) 150 MG/10ML syrup Take 10 mLs (150 mg total) by mouth 2 (two) times daily as needed for heartburn.  300 mL      Review of Systems  Eyes: Positive for blurred vision and photophobia.  Respiratory: Negative for shortness of breath.   Cardiovascular: Negative for chest pain and palpitations.  Gastrointestinal: Positive for diarrhea. Negative for abdominal pain.       Had last night, but none today  Neurological: Positive for dizziness and headaches.  Psychiatric/Behavioral: The patient is nervous/anxious.    Physical Exam   Blood pressure 117/79, pulse 102, temperature 97 F (36.1 C), temperature source Oral, resp. rate 20, height 5\' 3"  (1.6 m), weight 101.606 kg (224 lb), SpO2 97.00%.  Physical Exam  Constitutional: She is oriented to person, place, and time. She appears well-developed and well-nourished.  HENT:  Head: Normocephalic.  Neck: Normal range of motion.  Cardiovascular: Normal rate,  regular rhythm and normal heart sounds.   Respiratory: Effort normal and breath sounds normal.  GI: Soft.  Musculoskeletal: Normal range of motion.  Neurological: She is alert and oriented to person, place, and time.  Skin: Skin is warm and dry.  Psychiatric: Her mood appears anxious. Her speech is rapid and/or pressured.       Pt crying and rambling, difficult to assess, multiple c/o, apologizes and asks "what did I do wrong"     FHR 140 mod variability, accels, no decels CAT I toco rare ctx  MAU Course  Procedures    Assessment and Plan  IUP at 34wks GDM - not compliant, started glyburide today FHR  reassuring hypoglycemia  CBC CMET CBG   [name] M   07/05/2011, 3:40 PM   11/2 at 1652  CBG (last 3)   Basename 07/05/11 1552 07/05/11 1454  GLUCAP 88 68*    Results for orders placed during the hospital encounter of 07/05/11 (from the past 24 hour(s))  CBC     Status: Abnormal   Collection Time   07/05/11  2:38 PM      Component Value Range   WBC 5.8  4.0 - 10.5 (K/uL)   RBC 4.15  3.87 - 5.11 (MIL/uL)   Hemoglobin 11.9 (*) 12.0 - 15.0 (g/dL)   HCT 16.1 (*) 09.6 - 46.0 (%)   MCV 85.3  78.0 - 100.0 (fL)   MCH 28.7  26.0 - 34.0 (pg)   MCHC 33.6  30.0 - 36.0 (g/dL)   RDW 04.5  40.9 - 81.1 (%)   Platelets 218  150 - 400 (K/uL)  COMPREHENSIVE METABOLIC PANEL     Status: Abnormal   Collection Time   07/05/11  2:38 PM      Component Value Range   Sodium 133 (*) 135 - 145 (mEq/L)   Potassium 3.6  3.5 - 5.1 (mEq/L)   Chloride 102  96 - 112 (mEq/L)   CO2 22  19 - 32 (mEq/L)   Glucose, Bld 69 (*) 70 - 99 (mg/dL)   BUN 9  6 - 23 (mg/dL)   Creatinine, Ser 9.14  0.50 - 1.10 (mg/dL)   Calcium 9.4  8.4 - 78.2 (mg/dL)   Total Protein 6.4  6.0 - 8.3 (g/dL)   Albumin 3.2 (*) 3.5 - 5.2 (g/dL)   AST 15  0 - 37 (U/L)   ALT 11  0 - 35 (U/L)   Alkaline Phosphatase 89  39 - 117 (U/L)   Total Bilirubin 0.2 (*) 0.3 - 1.2 (mg/dL)   GFR calc non Af Amer >90  >90 (mL/min)   GFR calc Af Amer >90  >90 (mL/min)  GLUCOSE, CAPILLARY     Status: Abnormal   Collection Time   07/05/11  2:54 PM      Component Value Range   Glucose-Capillary 68 (*) 70 - 99 (mg/dL)  GLUCOSE, CAPILLARY     Status: Normal   Collection Time   07/05/11  3:52 PM      Component Value Range   Glucose-Capillary 88  70 - 99 (mg/dL)  URINALYSIS, ROUTINE W REFLEX MICROSCOPIC     Status: Abnormal   Collection Time   07/05/11  4:14 PM      Component Value Range   Color, Urine YELLOW  YELLOW    Appearance HAZY (*) CLEAR    Specific Gravity, Urine 1.020  1.005 - 1.030    pH 7.0  5.0 - 8.0    Glucose, UA NEGATIVE  NEGATIVE (mg/dL)   Hgb urine dipstick NEGATIVE  NEGATIVE    Bilirubin Urine NEGATIVE  NEGATIVE    Ketones, ur 15 (*) NEGATIVE (mg/dL)   Protein, ur NEGATIVE  NEGATIVE (mg/dL)   Urobilinogen, UA 1.0  0.0 - 1.0 (mg/dL)   Nitrite NEGATIVE  NEGATIVE    Leukocytes, UA SMALL (*) NEGATIVE   URINE MICROSCOPIC-ADD ON     Status: Abnormal   Collection Time   07/05/11  4:14 PM      Component Value Range   Squamous Epithelial / LPF MANY (*) RARE    WBC, UA 3-6  <3 (WBC/hpf)   RBC / HPF 0-2  <3 (RBC/hpf)   Bacteria, UA FEW (*) RARE    Crystals CA OXALATE CRYSTALS (*) NEGATIVE     Pt still very anxious and worried about taking glyburide, pt states she ate celery sticks and "crackers" and doesn't understand why her blood sugar was low on arrival. Headache did not improve after tylenol  Oxycodone given  Will observe for HA improvement   11/2 at 1950  Pt HA much improved, pt ate dinner and states she "feels more like myself" Still anxious however, vistaril offered and pt accepted.  Discussed at length, managing blood sugar and diet with frequent protein snacks and what to do if sx's of hypoglycemia.   Rx for vistaril given Pt to f/u as scheduled in office in 1wk  Dr Stefano Gaul informed of pt status D/C home stable condition

## 2011-07-18 ENCOUNTER — Encounter (HOSPITAL_COMMUNITY): Payer: Self-pay | Admitting: *Deleted

## 2011-07-18 ENCOUNTER — Inpatient Hospital Stay (HOSPITAL_COMMUNITY)
Admission: AD | Admit: 2011-07-18 | Discharge: 2011-07-18 | Disposition: A | Discharge status: Home / Self Care | Payer: Medicaid Other | Source: Ambulatory Visit | Attending: Obstetrics and Gynecology | Admitting: Obstetrics and Gynecology

## 2011-07-18 DIAGNOSIS — O24420 Gestational diabetes mellitus in childbirth, diet controlled: Secondary | ICD-10-CM

## 2011-07-18 DIAGNOSIS — O99891 Other specified diseases and conditions complicating pregnancy: Secondary | ICD-10-CM | POA: Insufficient documentation

## 2011-07-18 DIAGNOSIS — O479 False labor, unspecified: Secondary | ICD-10-CM

## 2011-07-18 DIAGNOSIS — O4700 False labor before 37 completed weeks of gestation, unspecified trimester: Secondary | ICD-10-CM | POA: Insufficient documentation

## 2011-07-18 NOTE — Progress Notes (Signed)
Nigel Bridgeman CNM at bedside talking with and reassuring pt. Pt removed from monitor as per V.Charlesetta Garibaldi, and is second reviewer

## 2011-07-18 NOTE — Progress Notes (Signed)
Pt G2 P1 at 36.4wks passing mucous and having lower abd pain.

## 2011-07-18 NOTE — ED Provider Notes (Signed)
History   30 yo G2P1001 at 34 4/7 weeks presented c/o passing thick mucus tonight and pelvic pain.  Denies leaking or bleeding, reports +FM.  Pregnancy remarkable for: Gestational diabetes, on Glyburide, but taking intermittently Positive GBS Ptyalism Hx gestational diabetes previous pregnancy   Chief Complaint  Patient presents with  . Vaginal Discharge     OB History    Grav Para Term Preterm Abortions TAB SAB Ect Mult Living   2 1 1  0 0 0 0 0 0 1      Past Medical History  Diagnosis Date  . Urinary tract infection   . Trichomonas 02/2011    treated  . Sinusitis   . Gestational diabetes     Past Surgical History  Procedure Date  . No past surgeries     Family History  Problem Relation Age of Onset  . Diabetes Mother   . Hypertension Mother   . Diabetes Father   . Hypertension Father     History  Substance Use Topics  . Smoking status: Never Smoker   . Smokeless tobacco: Never Used  . Alcohol Use: No    Allergies:  Allergies  Allergen Reactions  . Flagyl (Metronidazole Hcl) Swelling  . Latex Hives    Prescriptions prior to admission  Medication Sig Dispense Refill  . glyBURIDE (DIABETA) 2.5 MG tablet Take 2.5 mg by mouth daily with breakfast.        . glycopyrrolate (ROBINUL) 1 MG tablet Take 1 mg by mouth 3 (three) times daily.        . Multiple Vitamin (MULTIVITAMIN) tablet Take 1 tablet by mouth daily.        . ondansetron (ZOFRAN) 8 MG tablet Take 8 mg by mouth every 8 (eight) hours as needed. Nausea and vomiting      . pantoprazole (PROTONIX) 40 MG tablet Take 40 mg by mouth daily.        . promethazine (PHENERGAN) 25 MG tablet Take 25 mg by mouth every 6 (six) hours as needed. Nausea and vomiting         Physical Exam   Blood pressure 120/83, pulse 101, temperature 97.7 F (36.5 C), temperature source Oral, resp. rate 20, height 5\' 3"  (1.6 m), weight 103.057 kg (227 lb 3.2 oz).  Chest clear  Heart RRR without murmur Abd gravid,  NT Pelvic cervix 2-3, 60%, vtx -1, membranes intact, + stringy mucus at introitus FHR reactive in initial segment, no decels, currently in sleep cycle Irritability noted Ext WNL    ED Course  IUP at 36 4/7 weeks Mucus d/c, false labor Reactive NST  Plan: D/C home with labor precautions Reiterated need for patient to take Glyburide. Keep appointment as scheduled tomorrow, or call prn.  Nigel Bridgeman, CNM 07/18/11 1055

## 2011-07-21 ENCOUNTER — Encounter (HOSPITAL_COMMUNITY): Payer: Self-pay | Admitting: *Deleted

## 2011-07-21 ENCOUNTER — Inpatient Hospital Stay (HOSPITAL_COMMUNITY)
Admission: AD | Admit: 2011-07-21 | Discharge: 2011-07-22 | DRG: 781 | Disposition: A | Discharge status: Home / Self Care | Payer: Medicaid Other | Source: Ambulatory Visit | Attending: Obstetrics and Gynecology | Admitting: Obstetrics and Gynecology

## 2011-07-21 DIAGNOSIS — O212 Late vomiting of pregnancy: Secondary | ICD-10-CM | POA: Diagnosis present

## 2011-07-21 DIAGNOSIS — O99891 Other specified diseases and conditions complicating pregnancy: Secondary | ICD-10-CM | POA: Diagnosis present

## 2011-07-21 DIAGNOSIS — O99810 Abnormal glucose complicating pregnancy: Principal | ICD-10-CM | POA: Diagnosis present

## 2011-07-21 DIAGNOSIS — Z22330 Carrier of Group B streptococcus: Secondary | ICD-10-CM

## 2011-07-21 DIAGNOSIS — R1110 Vomiting, unspecified: Secondary | ICD-10-CM

## 2011-07-21 DIAGNOSIS — O9989 Other specified diseases and conditions complicating pregnancy, childbirth and the puerperium: Secondary | ICD-10-CM | POA: Diagnosis present

## 2011-07-21 DIAGNOSIS — O24419 Gestational diabetes mellitus in pregnancy, unspecified control: Secondary | ICD-10-CM

## 2011-07-21 DIAGNOSIS — O479 False labor, unspecified: Secondary | ICD-10-CM | POA: Diagnosis present

## 2011-07-21 DIAGNOSIS — K117 Disturbances of salivary secretion: Secondary | ICD-10-CM | POA: Diagnosis present

## 2011-07-21 DIAGNOSIS — Z3490 Encounter for supervision of normal pregnancy, unspecified, unspecified trimester: Secondary | ICD-10-CM

## 2011-07-21 LAB — URINE MICROSCOPIC-ADD ON

## 2011-07-21 LAB — URINALYSIS, ROUTINE W REFLEX MICROSCOPIC
Bilirubin Urine: NEGATIVE
Glucose, UA: 100 mg/dL — AB
Ketones, ur: NEGATIVE mg/dL
Protein, ur: NEGATIVE mg/dL
pH: 6 (ref 5.0–8.0)

## 2011-07-21 LAB — CBC
MCH: 29.7 pg (ref 26.0–34.0)
MCV: 85.5 fL (ref 78.0–100.0)
Platelets: 224 10*3/uL (ref 150–400)
RBC: 3.94 MIL/uL (ref 3.87–5.11)
RDW: 12.2 % (ref 11.5–15.5)
WBC: 5.7 10*3/uL (ref 4.0–10.5)

## 2011-07-21 LAB — GLUCOSE, CAPILLARY: Glucose-Capillary: 174 mg/dL — ABNORMAL HIGH (ref 70–99)

## 2011-07-21 MED ORDER — PHENYLEPHRINE 40 MCG/ML (10ML) SYRINGE FOR IV PUSH (FOR BLOOD PRESSURE SUPPORT): 80.0000 ug | PREFILLED_SYRINGE | INTRAVENOUS | Status: DC | PRN

## 2011-07-21 MED ORDER — OXYTOCIN BOLUS FROM INFUSION
500.0000 mL | Freq: Once | INTRAVENOUS | Status: DC
  Filled 2011-07-21: qty 500

## 2011-07-21 MED ORDER — EPHEDRINE 5 MG/ML INJ: 10.0000 mg | INTRAVENOUS | Status: DC | PRN

## 2011-07-21 MED ORDER — DIPHENHYDRAMINE HCL 50 MG/ML IJ SOLN: 12.5000 mg | INTRAMUSCULAR | Status: DC | PRN

## 2011-07-21 MED ORDER — ONDANSETRON HCL 4 MG/2ML IJ SOLN
4.0000 mg | Freq: Four times a day (QID) | INTRAMUSCULAR | Status: DC | PRN
  Administered 2011-07-21: 4 mg via INTRAVENOUS
  Filled 2011-07-21: qty 2

## 2011-07-21 MED ORDER — CYCLOBENZAPRINE HCL 10 MG PO TABS
10.0000 mg | ORAL_TABLET | Freq: Three times a day (TID) | ORAL | Status: DC | PRN
  Administered 2011-07-21 – 2011-07-22 (×2): 10 mg via ORAL
  Filled 2011-07-21 (×2): qty 1

## 2011-07-21 MED ORDER — BUTORPHANOL TARTRATE 2 MG/ML IJ SOLN
2.0000 mg | INTRAMUSCULAR | Status: DC | PRN
  Administered 2011-07-21: 2 mg via INTRAVENOUS
  Filled 2011-07-21: qty 1

## 2011-07-21 MED ORDER — PENICILLIN G POTASSIUM 5000000 UNITS IJ SOLR
2.5000 10*6.[IU] | INTRAVENOUS | Status: DC
  Filled 2011-07-21 (×2): qty 2.5

## 2011-07-21 MED ORDER — PROMETHAZINE HCL 25 MG/ML IJ SOLN
25.0000 mg | INTRAMUSCULAR | Status: DC | PRN
  Administered 2011-07-21: 25 mg via INTRAVENOUS
  Filled 2011-07-21 (×2): qty 1

## 2011-07-21 MED ORDER — PENICILLIN G POTASSIUM 5000000 UNITS IJ SOLR
5.0000 10*6.[IU] | Freq: Once | INTRAVENOUS | Status: DC
  Administered 2011-07-21: 5 10*6.[IU] via INTRAVENOUS
  Filled 2011-07-21: qty 5

## 2011-07-21 MED ORDER — PANTOPRAZOLE SODIUM 40 MG PO TBEC
40.0000 mg | DELAYED_RELEASE_TABLET | Freq: Two times a day (BID) | ORAL | Status: DC
  Filled 2011-07-21: qty 1

## 2011-07-21 MED ORDER — OXYCODONE-ACETAMINOPHEN 5-325 MG PO TABS: 2.0000 | ORAL_TABLET | ORAL | Status: DC | PRN

## 2011-07-21 MED ORDER — OXYTOCIN 20 UNITS IN LACTATED RINGERS INFUSION - SIMPLE: 125.0000 mL/h | Freq: Once | INTRAVENOUS | Status: DC

## 2011-07-21 MED ORDER — LIDOCAINE HCL (PF) 1 % IJ SOLN: 30.0000 mL | INTRAMUSCULAR | Status: DC | PRN

## 2011-07-21 MED ORDER — LACTATED RINGERS IV SOLN: INTRAVENOUS | Status: DC

## 2011-07-21 MED ORDER — FENTANYL 2.5 MCG/ML BUPIVACAINE 1/10 % EPIDURAL INFUSION (WH - ANES): 14.0000 mL/h | INTRAMUSCULAR | Status: DC

## 2011-07-21 MED ORDER — IBUPROFEN 600 MG PO TABS: 600.0000 mg | ORAL_TABLET | Freq: Four times a day (QID) | ORAL | Status: DC | PRN

## 2011-07-21 MED ORDER — CITRIC ACID-SODIUM CITRATE 334-500 MG/5ML PO SOLN: 30.0000 mL | ORAL | Status: DC | PRN

## 2011-07-21 MED ORDER — LACTATED RINGERS IV SOLN
INTRAVENOUS | Status: DC
  Administered 2011-07-21: 1000 mL via INTRAVENOUS

## 2011-07-21 MED ORDER — PANTOPRAZOLE SODIUM 40 MG PO TBEC
40.0000 mg | DELAYED_RELEASE_TABLET | Freq: Two times a day (BID) | ORAL | Status: DC
  Administered 2011-07-21 – 2011-07-22 (×2): 40 mg via ORAL
  Filled 2011-07-21 (×2): qty 1

## 2011-07-21 MED ORDER — LACTATED RINGERS IV SOLN: 500.0000 mL | Freq: Once | INTRAVENOUS | Status: DC

## 2011-07-21 MED ORDER — ZOLPIDEM TARTRATE 5 MG PO TABS: 5.0000 mg | ORAL_TABLET | Freq: Every evening | ORAL | Status: DC | PRN

## 2011-07-21 MED ORDER — LACTATED RINGERS IV SOLN: 500.0000 mL | INTRAVENOUS | Status: DC | PRN

## 2011-07-21 MED ORDER — OXYTOCIN 10 UNIT/ML IJ SOLN: 10.0000 [IU] | Freq: Once | INTRAMUSCULAR | Status: DC

## 2011-07-21 MED ORDER — GLYBURIDE 2.5 MG PO TABS
2.5000 mg | ORAL_TABLET | Freq: Every day | ORAL | Status: DC
  Administered 2011-07-22: 2.5 mg via ORAL
  Filled 2011-07-21: qty 1

## 2011-07-21 MED ORDER — ACETAMINOPHEN 325 MG PO TABS: 650.0000 mg | ORAL_TABLET | ORAL | Status: DC | PRN

## 2011-07-21 NOTE — Progress Notes (Signed)
[name]   Subjective:pt  Complains of back pain.     Objective: BP 109/75  Pulse 92  Temp(Src) 98 F (36.7 C) (Oral)  Resp 20  Ht 5\' 3"  (1.6 m)  Wt 101.152 kg (223 lb)  BMI 39.50 kg/m2      FHT:  FHR: 140 bpm, variability: minimal ,  accelerations:  Present,  decelerations:  Absent UC:   irregular, every 30-40 minutes SVE:   Dilation: Fingertip Effacement (%): 50 Station: -3 Exam by:: dr Normand Sloop  Labs: Lab Results  Component Value Date   WBC 5.7 07/21/2011   HGB 11.7* 07/21/2011   HCT 33.7* 07/21/2011   MCV 85.5 07/21/2011   PLT 224 07/21/2011    Assessment / Plan: back pain  Labor: i doubt patient in labor.  will monitor closely.  do [place] in am, monitor bps she had one that was elevated and check a ua Fetal Wellbeing:  Category II Anticipated MOD:  na  [name] A 07/21/2011, 8:59 PM

## 2011-07-21 NOTE — Progress Notes (Signed)
Patient brought directly from lobby to room 7 in MAU offered wheelchair assistance x 3 and patient refused.

## 2011-07-21 NOTE — H&P (Signed)
[name] is a 31 y.o. female presenting for complaint of contractions x 1 hour, denies srom, vag bleeding,+fm,  had not taken glyburide x 3 days or checked blood sugar, requests IV meds, plans epidural Pregnancy significant for: GDM 2 GBS+ 37 week IUP Hyperemesis ptyalism Maternal Medical History:  Reason for admission: Reason for admission: contractions.  Contractions: Onset was less than 1 hour ago.   Frequency: regular.   Perceived severity is moderate.    Fetal activity: Perceived fetal activity is normal.   Last perceived fetal movement was within the past hour.    Prenatal complications: no prenatal complications Prenatal Complications - Diabetes: type 2. Diabetes is managed by oral agent (monotherapy).   On glyburide  OB History    Grav Para Term Preterm Abortions TAB SAB Ect Mult Living   2 1 1  0 0 0 0 0 0 1     Past Medical History  Diagnosis Date  . Urinary tract infection   . Trichomonas 02/2011    treated  . Sinusitis   . Gestational diabetes    Past Surgical History  Procedure Date  . No past surgeries    Family History: family history includes Diabetes in her father and mother and Hypertension in her father and mother. Social History:  reports that she has never smoked. She has never used smokeless tobacco. She reports that she does not drink alcohol or use illicit drugs. 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   Eyes: Negative.   Respiratory: Negative.   Cardiovascular: Negative.   Gastrointestinal: Negative.   Genitourinary: Negative.   Skin: Negative.   Neurological: Negative.   Endo/Heme/Allergies: Negative.   Psychiatric/Behavioral: Negative.     Dilation: 3.5 Effacement (%): 100 Station: -2 Exam by:: Elie Confer RN Blood pressure 144/93, pulse 98, temperature 98 F (36.7 C), temperature source Oral, resp. rate 98, height 5\' 3"  (1.6 m), weight 101.152 kg (223 lb). Maternal Exam:    Uterine Assessment: Contraction strength is moderate.  Contraction duration is 2 minutes. Contraction frequency is regular.   Abdomen: Patient reports no abdominal tenderness. Estimated fetal weight is 8 lb.   Fetal presentation: vertex  Introitus: Normal vulva. Normal vagina.  Ferning test: not done.  Nitrazine test: not done.  Pelvis: adequate for delivery.   Cervix: Cervix evaluated by digital exam.     Fetal Exam Fetal Monitor Review: Mode: ultrasound.   Baseline rate: 150.  Variability: moderate (6-25 bpm).   Pattern: accelerations present and no decelerations.    Fetal State Assessment: Category I - tracings are normal.     Physical Exam  Constitutional: She appears well-developed.  Cardiovascular: Normal rate.   Respiratory: Effort normal.  GI: Soft.  Genitourinary: Vagina normal.  Neurological: She is alert.  Skin: Skin is warm and dry.  Psychiatric: She has a normal mood and affect.    Prenatal labs: ABO, Rh: A/Positive/-- (05/14 0000) Antibody: Negative (05/14 0000) Rubella: Immune (05/14 0000) RPR: Nonreactive (05/14 0000)  HBsAg: Negative (05/14 0000)  HIV: Non-reactive (05/14 0000)  GBS: Positive (11/18 0000) Quad screen neg 1 gtt  Pap with trich treated GC/CT neg/neg Early 1 gtt 90, HbAIC 6.1   Assessment/Plan: Active labor GDM 2 GBS+ 37 week IUP Hyperemesis Ptyalism P admit, CBG q 4 hour, glyburide on admission, IV stadol, epidural when desires, Pen G, Dr. Normand Sloop updated on pt status per telephone.   Waris Rodger M 07/21/2011, 6:58 PM

## 2011-07-22 ENCOUNTER — Inpatient Hospital Stay (HOSPITAL_COMMUNITY): Payer: Medicaid Other

## 2011-07-22 LAB — GLUCOSE, CAPILLARY: Glucose-Capillary: 150 mg/dL — ABNORMAL HIGH (ref 70–99)

## 2011-07-22 LAB — RPR: RPR Ser Ql: NONREACTIVE

## 2011-07-22 MED ORDER — ONDANSETRON HCL 4 MG/2ML IJ SOLN
4.0000 mg | Freq: Four times a day (QID) | INTRAMUSCULAR | Status: DC | PRN
  Administered 2011-07-22: 4 mg via INTRAVENOUS
  Filled 2011-07-22: qty 2

## 2011-07-22 NOTE — Discharge Summary (Signed)
Physician Discharge Summary  Patient ID: [name] MRN: [id number] DOB/AGE: 08-Jul-1981 30 y.o.  Admit date: 07/21/2011 Discharge date: 07/22/2011  Admission Diagnoses: 1.  IUP at 37 weeks 2.  Suspected Labor  Discharge Diagnoses: 1.  IUP at 37.1 weeks  2.  False Labor  3.  GDM-glyburide (noncompliant) 4.  H/o Ptyalism  5.  8/8 BPP with normal growth on u/s this morning  6.  Category I FHT  7.  Musculoskeletal pains Active Problems:  GDM, class A2  Normal pregnancy  Hyperemesis  GBS carrier   Discharged Condition: stable  Hospital Course: Pt was admitted from triage after RN called pt 3-4 cm in suspected labor.  After next recheck by Dr. Normand Sloop, cx was noted to be fingertip.  Pt was observed overnight for labor s/s.  Her ctxs have spaced spontaneously and cx hasn't changed.  She has received po pain meds and muscle relaxer for primarily back, but also joint/muscle pains.  FHT has been reactive and reassuring.  She had u/s this AM and BPP and growth WNL.    Consults: none  Significant Diagnostic Studies: OB u/s  Treatments: analgesia: Flexeril   Discharge Exam: Blood pressure 113/68, pulse 93, temperature 97.7 F (36.5 C), temperature source Oral, resp. rate 20, height 5\' 3"  (1.6 m), weight 101.152 kg (223 lb). see progress note  Disposition: Home or Self Care  Discharge Orders    Future Orders Please Complete By Expires   Strep B DNA probe      Comments:   This external order was created through the Results Console.     Current Discharge Medication List    CONTINUE these medications which have NOT CHANGED   Details  glyBURIDE (DIABETA) 2.5 MG tablet Take 2.5 mg by mouth daily with breakfast.      glycopyrrolate (ROBINUL) 1 MG tablet Take 1 mg by mouth 3 (three) times daily.      Multiple Vitamin (MULTIVITAMIN) tablet Take 1 tablet by mouth daily.      ondansetron (ZOFRAN) 8 MG tablet Take 8 mg by mouth every 8 (eight) hours as needed. Nausea and vomiting      pantoprazole (PROTONIX) 40 MG tablet Take 40 mg by mouth daily.      promethazine (PHENERGAN) 25 MG tablet Take 25 mg by mouth every 6 (six) hours as needed. Nausea and vomiting       Follow-up Information    Follow up with [facility] on 07/24/2011. (or call  as needed if symptoms worsen)          SignedAntonietta Breach 07/22/2011, 11:31 AM

## 2011-07-22 NOTE — Progress Notes (Signed)
Report in [facility]; follow-up as needed 

## 2011-07-22 NOTE — Progress Notes (Signed)
Subjective: Pt still with musculoskeletal complaints, mainly back; some relief from Flexeril.  Contractions are occasional.  Good fetal movement.  No VB or LOF.  Pt states Dr. Estanislado Pandy said her cx was 2cm on her last exam.  Objective: BP 113/68  Pulse 93  Temp(Src) 97.7 F (36.5 C) (Oral)  Resp 20  Ht 5\' 3"  (1.6 m)  Wt 101.152 kg (223 lb)  BMI 39.50 kg/m2    .Marland Kitchen Filed Vitals:   07/22/11 0528 07/22/11 0630 07/22/11 0742 07/22/11 0936  BP:   128/66 113/68  Pulse:   105 93  Temp:   97.6 F (36.4 C) 97.7 F (36.5 C)  TempSrc:   Oral Oral  Resp: 20 20 20 20   Height:      Weight:        FHT:  FHR: 125 bpm, variability: moderate,  accelerations:  Present,  decelerations:  Absent UC:   occasional SVE:   FT/50/-2 to -3, mid position, Vtx; no change U/S:  Complete:  BPP 8/8; AFI WNL; Vtx, EFW=6+9 (57%); "dynamic cervix" CBG's:  150 at 0031; Fasting this AM=87. Labs: Lab Results  Component Value Date   WBC 5.7 07/21/2011   HGB 11.7* 07/21/2011   HCT 33.7* 07/21/2011   MCV 85.5 07/21/2011   PLT 224 07/21/2011    Assessment / Plan: 1.  False Labor 2.  GDM-glyburide (noncompliant)  3.  Back/Joint pain 4.  No cx progression and spacing of ctxs 5.  8/8 BPP and nml growth on u/s this morning  Labor: no s/s of labor Preeclampsia:  no signs or symptoms of toxicity Fetal Wellbeing:  Category I Pain Control:  Pt has received Flexeril; other comfort measures discussed I/D:  n/a Anticipated MOD:  NSVD Per c/w Dr. Su Hilt, will d/c home; pt has f/u appt this Wed w/ u/s & MD appt.  Reviewed labor s/s, FKC, and reiterated importance of checking CBG's 4x/day and taking Glyburide as prescribed.  Has Rx's for Vicodin and Flexeril.  F/u prn otherwise any questions or concerns.  Jacarri Gesner H 07/22/2011, 11:21 AM

## 2011-07-29 ENCOUNTER — Encounter (HOSPITAL_COMMUNITY): Payer: Self-pay | Admitting: *Deleted

## 2011-07-29 ENCOUNTER — Inpatient Hospital Stay (HOSPITAL_COMMUNITY)
Admission: AD | Admit: 2011-07-29 | Discharge: 2011-07-29 | Disposition: A | Discharge status: Home / Self Care | Payer: Medicaid Other | Source: Ambulatory Visit | Attending: Obstetrics and Gynecology | Admitting: Obstetrics and Gynecology

## 2011-07-29 DIAGNOSIS — O99810 Abnormal glucose complicating pregnancy: Secondary | ICD-10-CM | POA: Insufficient documentation

## 2011-07-29 DIAGNOSIS — O212 Late vomiting of pregnancy: Secondary | ICD-10-CM | POA: Insufficient documentation

## 2011-07-29 HISTORY — DX: Unspecified abnormal cytological findings in specimens from cervix uteri: R87.619

## 2011-07-29 HISTORY — DX: Reserved for concepts with insufficient information to code with codable children: IMO0002

## 2011-07-29 LAB — COMPREHENSIVE METABOLIC PANEL
Albumin: 3.2 g/dL — ABNORMAL LOW (ref 3.5–5.2)
BUN: 9 mg/dL (ref 6–23)
Calcium: 9.8 mg/dL (ref 8.4–10.5)
Creatinine, Ser: 0.57 mg/dL (ref 0.50–1.10)
Total Protein: 7 g/dL (ref 6.0–8.3)

## 2011-07-29 LAB — CBC
HCT: 35.3 % — ABNORMAL LOW (ref 36.0–46.0)
Hemoglobin: 12.1 g/dL (ref 12.0–15.0)
MCH: 29.2 pg (ref 26.0–34.0)
MCHC: 34.3 g/dL (ref 30.0–36.0)
RDW: 12.4 % (ref 11.5–15.5)

## 2011-07-29 LAB — DIFFERENTIAL
Basophils Absolute: 0 10*3/uL (ref 0.0–0.1)
Basophils Relative: 0 % (ref 0–1)
Eosinophils Absolute: 0 10*3/uL (ref 0.0–0.7)
Monocytes Absolute: 0.7 10*3/uL (ref 0.1–1.0)
Monocytes Relative: 12 % (ref 3–12)

## 2011-07-29 LAB — URINALYSIS, ROUTINE W REFLEX MICROSCOPIC
Bilirubin Urine: NEGATIVE
Glucose, UA: NEGATIVE mg/dL
Ketones, ur: NEGATIVE mg/dL
Nitrite: NEGATIVE
Protein, ur: NEGATIVE mg/dL
pH: 6.5 (ref 5.0–8.0)

## 2011-07-29 MED ORDER — ONDANSETRON HCL 4 MG/2ML IJ SOLN
4.0000 mg | Freq: Three times a day (TID) | INTRAMUSCULAR | Status: DC
  Administered 2011-07-29: 4 mg via INTRAVENOUS
  Filled 2011-07-29: qty 2

## 2011-07-29 MED ORDER — LACTATED RINGERS IV SOLN: INTRAVENOUS | Status: DC

## 2011-07-29 MED ORDER — LACTATED RINGERS IV BOLUS (SEPSIS)
500.0000 mL | Freq: Once | INTRAVENOUS | Status: AC
  Administered 2011-07-29: 1000 mL via INTRAVENOUS

## 2011-07-29 NOTE — ED Provider Notes (Addendum)
History   30 yo G2P1001 at 43 1/7 weeks presented from office due to N/V since early am.  Took Zofran and Phenergan, but threw them up.  Denies fever, chills, diarrhea, dysuria--does report feeling generally bad since this weekend, with headache and urinary frequency.  Was seen in office today by Dr. Stefano Gaul, had reactive NST.  Reports CBGs WNL today, with fasting 65 and later am value 95.  Pregnancy remarkable for: Gestational diabetes--on Glyburide 2.5 mg q day GBS positive  Hyperemesis  Ptyalism    Chief Complaint  Patient presents with  . Emesis During Pregnancy     OB History    Grav Para Term Preterm Abortions TAB SAB Ect Mult Living   2 1 1  0 0 0 0 0 0 1      Past Medical History  Diagnosis Date  . Urinary tract infection   . Trichomonas 02/2011    treated  . Sinusitis   . Gestational diabetes   . Abnormal Pap smear     Past Surgical History  Procedure Date  . No past surgeries     Family History  Problem Relation Age of Onset  . Diabetes Mother   . Hypertension Mother   . Diabetes Father   . Hypertension Father     History  Substance Use Topics  . Smoking status: Never Smoker   . Smokeless tobacco: Never Used  . Alcohol Use: No    Allergies:  Allergies  Allergen Reactions  . Flagyl (Metronidazole Hcl) Swelling  . Latex Hives    Prescriptions prior to admission  Medication Sig Dispense Refill  . glyBURIDE (DIABETA) 2.5 MG tablet Take 2.5 mg by mouth daily with breakfast.        . glycopyrrolate (ROBINUL) 1 MG tablet Take 1 mg by mouth 3 (three) times daily.        . Multiple Vitamin (MULTIVITAMIN) tablet Take 1 tablet by mouth daily.        . ondansetron (ZOFRAN) 8 MG tablet Take 8 mg by mouth every 8 (eight) hours as needed. Nausea and vomiting      . pantoprazole (PROTONIX) 40 MG tablet Take 40 mg by mouth daily.        . promethazine (PHENERGAN) 25 MG tablet Take 25 mg by mouth every 6 (six) hours as needed. Nausea and vomiting          Physical Exam   Blood pressure 119/75, pulse 101, temperature 98.5 F (36.9 C), temperature source Oral, resp. rate 20, height 5\' 3"  (1.6 m), weight 102.331 kg (225 lb 9.6 oz).  Chest clear Heart RRR without murmur Abd gravid, NT Pelvic deferred Ext negative Homan's, DTR 2+, 1+ edema FHR non-reactive at present, but reassuring. Irregular, mild contractions.  ED Course   IUP at 38 1/7 weeks N/V--? Viral syndrome vs. Exacerbation of hyperemesis Gestational diabetes  Plan: CBC, diff, CMP IV hydration  Zofran Continuous monitoring  Nigel Bridgeman, CNM, [place] 07/29/11 2:20pm  Addendum: Patient has eaten several times during her stay in MAU (crackers with peanut butter, sandwich, po fluids) without further vomiting. FHR reactive, no decels Very occasional contractions.  Filed Vitals:   07/29/11 1815  BP: 122/72  Pulse: 88  Temp: 97.3 F (36.3 C)  Resp: 20   Results for orders placed during the hospital encounter of 07/29/11 (from the past 24 hour(s))  URINALYSIS, ROUTINE W REFLEX MICROSCOPIC     Status: Abnormal   Collection Time   07/29/11  1:20 PM  Component Value Range   Color, Urine YELLOW  YELLOW    Appearance CLEAR  CLEAR    Specific Gravity, Urine >1.030 (*) 1.005 - 1.030    pH 6.5  5.0 - 8.0    Glucose, UA NEGATIVE  NEGATIVE (mg/dL)   Hgb urine dipstick NEGATIVE  NEGATIVE    Bilirubin Urine NEGATIVE  NEGATIVE    Ketones, ur NEGATIVE  NEGATIVE (mg/dL)   Protein, ur NEGATIVE  NEGATIVE (mg/dL)   Urobilinogen, UA 1.0  0.0 - 1.0 (mg/dL)   Nitrite NEGATIVE  NEGATIVE    Leukocytes, UA NEGATIVE  NEGATIVE   CBC     Status: Abnormal   Collection Time   07/29/11  1:28 PM      Component Value Range   WBC 5.4  4.0 - 10.5 (K/uL)   RBC 4.15  3.87 - 5.11 (MIL/uL)   Hemoglobin 12.1  12.0 - 15.0 (g/dL)   HCT 11.9 (*) 14.7 - 46.0 (%)   MCV 85.1  78.0 - 100.0 (fL)   MCH 29.2  26.0 - 34.0 (pg)   MCHC 34.3  30.0 - 36.0 (g/dL)   RDW 82.9  56.2 - 13.0 (%)    Platelets 205  150 - 400 (K/uL)  DIFFERENTIAL     Status: Normal   Collection Time   07/29/11  1:28 PM      Component Value Range   Neutrophils Relative 53  43 - 77 (%)   Neutro Abs 2.9  1.7 - 7.7 (K/uL)   Lymphocytes Relative 34  12 - 46 (%)   Lymphs Abs 1.9  0.7 - 4.0 (K/uL)   Monocytes Relative 12  3 - 12 (%)   Monocytes Absolute 0.7  0.1 - 1.0 (K/uL)   Eosinophils Relative 1  0 - 5 (%)   Eosinophils Absolute 0.0  0.0 - 0.7 (K/uL)   Basophils Relative 0  0 - 1 (%)   Basophils Absolute 0.0  0.0 - 0.1 (K/uL)  COMPREHENSIVE METABOLIC PANEL     Status: Abnormal   Collection Time   07/29/11  1:28 PM      Component Value Range   Sodium 130 (*) 135 - 145 (mEq/L)   Potassium 3.7  3.5 - 5.1 (mEq/L)   Chloride 99  96 - 112 (mEq/L)   CO2 21  19 - 32 (mEq/L)   Glucose, Bld 74  70 - 99 (mg/dL)   BUN 9  6 - 23 (mg/dL)   Creatinine, Ser 8.65  0.50 - 1.10 (mg/dL)   Calcium 9.8  8.4 - 78.4 (mg/dL)   Total Protein 7.0  6.0 - 8.3 (g/dL)   Albumin 3.2 (*) 3.5 - 5.2 (g/dL)   AST 15  0 - 37 (U/L)   ALT 10  0 - 35 (U/L)   Alkaline Phosphatase 114  39 - 117 (U/L)   Total Bilirubin 0.2 (*) 0.3 - 1.2 (mg/dL)   GFR calc non Af Amer >90  >90 (mL/min)   GFR calc Af Amer >90  >90 (mL/min)    Consulted with Dr. Estanislado Pandy. D/C'd home with N/V precautions. Keep scheduled appointment this week or call prn. Continue same home medications.  Nigel Bridgeman, CNM, [place] 07/29/11 6:20p

## 2011-07-29 NOTE — Progress Notes (Signed)
Been throwing up all morning.  Can't keep anything down.  Been having pain, peeing all the time, pelvic pressure. Was at appt this morning.  Told to come here for fluids.

## 2011-07-31 ENCOUNTER — Encounter (HOSPITAL_COMMUNITY): Payer: Self-pay | Admitting: *Deleted

## 2011-07-31 ENCOUNTER — Telehealth (HOSPITAL_COMMUNITY): Payer: Self-pay | Admitting: *Deleted

## 2011-07-31 NOTE — Telephone Encounter (Signed)
Preadmission screen  

## 2011-08-01 ENCOUNTER — Inpatient Hospital Stay (HOSPITAL_COMMUNITY)
Admission: AD | Admit: 2011-08-01 | Discharge: 2011-08-04 | DRG: 775 | Disposition: A | Discharge status: Home / Self Care | Payer: Medicaid Other | Source: Ambulatory Visit | Attending: Obstetrics and Gynecology | Admitting: Obstetrics and Gynecology

## 2011-08-01 ENCOUNTER — Encounter (HOSPITAL_COMMUNITY): Payer: Self-pay

## 2011-08-01 DIAGNOSIS — O99814 Abnormal glucose complicating childbirth: Principal | ICD-10-CM | POA: Diagnosis present

## 2011-08-01 DIAGNOSIS — Z22330 Carrier of Group B streptococcus: Secondary | ICD-10-CM

## 2011-08-01 DIAGNOSIS — K5900 Constipation, unspecified: Secondary | ICD-10-CM | POA: Diagnosis present

## 2011-08-01 DIAGNOSIS — O99892 Other specified diseases and conditions complicating childbirth: Secondary | ICD-10-CM | POA: Diagnosis present

## 2011-08-01 DIAGNOSIS — O26899 Other specified pregnancy related conditions, unspecified trimester: Secondary | ICD-10-CM | POA: Diagnosis present

## 2011-08-01 DIAGNOSIS — R600 Localized edema: Secondary | ICD-10-CM | POA: Diagnosis not present

## 2011-08-01 DIAGNOSIS — K117 Disturbances of salivary secretion: Secondary | ICD-10-CM | POA: Diagnosis present

## 2011-08-01 LAB — CBC
HCT: 35.7 % — ABNORMAL LOW (ref 36.0–46.0)
Hemoglobin: 12.2 g/dL (ref 12.0–15.0)
MCV: 85.6 fL (ref 78.0–100.0)
RBC: 4.17 MIL/uL (ref 3.87–5.11)
WBC: 5.7 10*3/uL (ref 4.0–10.5)

## 2011-08-01 MED ORDER — PROMETHAZINE HCL 25 MG PO TABS
25.0000 mg | ORAL_TABLET | Freq: Four times a day (QID) | ORAL | Status: DC | PRN
  Administered 2011-08-01: 25 mg via ORAL
  Filled 2011-08-01: qty 1

## 2011-08-01 MED ORDER — OXYTOCIN 20 UNITS IN LACTATED RINGERS INFUSION - SIMPLE
1.0000 m[IU]/min | INTRAVENOUS | Status: DC
  Filled 2011-08-01: qty 1000

## 2011-08-01 MED ORDER — IBUPROFEN 600 MG PO TABS
600.0000 mg | ORAL_TABLET | Freq: Four times a day (QID) | ORAL | Status: DC | PRN
  Administered 2011-08-02: 600 mg via ORAL
  Filled 2011-08-01: qty 1

## 2011-08-01 MED ORDER — OXYTOCIN 20 UNITS IN LACTATED RINGERS INFUSION - SIMPLE
125.0000 mL/h | Freq: Once | INTRAVENOUS | Status: AC
  Administered 2011-08-02: 999 mL/h via INTRAVENOUS

## 2011-08-01 MED ORDER — ONDANSETRON HCL 4 MG/2ML IJ SOLN
4.0000 mg | Freq: Four times a day (QID) | INTRAMUSCULAR | Status: DC | PRN
  Administered 2011-08-01 – 2011-08-02 (×2): 4 mg via INTRAVENOUS
  Filled 2011-08-01 (×2): qty 2

## 2011-08-01 MED ORDER — GLYCOPYRROLATE 1 MG PO TABS
1.0000 mg | ORAL_TABLET | Freq: Three times a day (TID) | ORAL | Status: DC
  Administered 2011-08-01 – 2011-08-02 (×3): 1 mg via ORAL
  Filled 2011-08-01 (×6): qty 1

## 2011-08-01 MED ORDER — PENICILLIN G POTASSIUM 5000000 UNITS IJ SOLR
5.0000 10*6.[IU] | Freq: Once | INTRAVENOUS | Status: DC
  Administered 2011-08-01: 5 10*6.[IU] via INTRAVENOUS
  Filled 2011-08-01: qty 5

## 2011-08-01 MED ORDER — CITRIC ACID-SODIUM CITRATE 334-500 MG/5ML PO SOLN: 30.0000 mL | ORAL | Status: DC | PRN

## 2011-08-01 MED ORDER — HYDROXYZINE HCL 50 MG PO TABS
50.0000 mg | ORAL_TABLET | Freq: Four times a day (QID) | ORAL | Status: DC | PRN
  Filled 2011-08-01: qty 1

## 2011-08-01 MED ORDER — LIDOCAINE HCL (PF) 1 % IJ SOLN
30.0000 mL | INTRAMUSCULAR | Status: DC | PRN
  Administered 2011-08-02: 30 mL via SUBCUTANEOUS
  Filled 2011-08-01 (×2): qty 30

## 2011-08-01 MED ORDER — OXYCODONE-ACETAMINOPHEN 5-325 MG PO TABS: 2.0000 | ORAL_TABLET | ORAL | Status: DC | PRN

## 2011-08-01 MED ORDER — LACTATED RINGERS IV SOLN: 500.0000 mL | INTRAVENOUS | Status: DC | PRN

## 2011-08-01 MED ORDER — ONDANSETRON HCL 4 MG PO TABS
8.0000 mg | ORAL_TABLET | Freq: Three times a day (TID) | ORAL | Status: DC | PRN
  Filled 2011-08-01: qty 2

## 2011-08-01 MED ORDER — GLYBURIDE 2.5 MG PO TABS
2.5000 mg | ORAL_TABLET | Freq: Every day | ORAL | Status: DC
  Administered 2011-08-02: 2.5 mg via ORAL
  Filled 2011-08-01 (×2): qty 1

## 2011-08-01 MED ORDER — PENICILLIN G POTASSIUM 5000000 UNITS IJ SOLR
2.5000 10*6.[IU] | INTRAVENOUS | Status: DC
  Administered 2011-08-01 – 2011-08-02 (×3): 2.5 10*6.[IU] via INTRAVENOUS
  Filled 2011-08-01 (×6): qty 2.5

## 2011-08-01 MED ORDER — CEFAZOLIN SODIUM-DEXTROSE 2-3 GM-% IV SOLR: 2.0000 g | Freq: Once | INTRAVENOUS | Status: DC

## 2011-08-01 MED ORDER — ONDANSETRON 8 MG PO TBDP
8.0000 mg | ORAL_TABLET | Freq: Three times a day (TID) | ORAL | Status: DC
  Administered 2011-08-02 (×2): 8 mg via ORAL
  Filled 2011-08-01 (×7): qty 1

## 2011-08-01 MED ORDER — MISOPROSTOL 25 MCG QUARTER TABLET
25.0000 ug | ORAL_TABLET | ORAL | Status: DC | PRN
  Administered 2011-08-01 (×3): 25 ug via VAGINAL
  Filled 2011-08-01 (×2): qty 0.25
  Filled 2011-08-01: qty 1
  Filled 2011-08-01: qty 0.25

## 2011-08-01 MED ORDER — TERBUTALINE SULFATE 1 MG/ML IJ SOLN: 0.2500 mg | Freq: Once | INTRAMUSCULAR | Status: AC | PRN

## 2011-08-01 MED ORDER — FLEET ENEMA 7-19 GM/118ML RE ENEM: 1.0000 | ENEMA | RECTAL | Status: DC | PRN

## 2011-08-01 MED ORDER — ZOLPIDEM TARTRATE 10 MG PO TABS: 10.0000 mg | ORAL_TABLET | Freq: Every evening | ORAL | Status: DC | PRN

## 2011-08-01 MED ORDER — OXYTOCIN BOLUS FROM INFUSION
500.0000 mL | Freq: Once | INTRAVENOUS | Status: DC
  Filled 2011-08-01: qty 500
  Filled 2011-08-01: qty 1000

## 2011-08-01 MED ORDER — CEFAZOLIN SODIUM 1-5 GM-% IV SOLN: 1.0000 g | Freq: Three times a day (TID) | INTRAVENOUS | Status: DC

## 2011-08-01 MED ORDER — PANTOPRAZOLE SODIUM 40 MG PO TBEC
40.0000 mg | DELAYED_RELEASE_TABLET | Freq: Every day | ORAL | Status: DC
  Administered 2011-08-02: 40 mg via ORAL
  Filled 2011-08-01 (×3): qty 1

## 2011-08-01 MED ORDER — HYDROXYZINE HCL 50 MG/ML IM SOLN
50.0000 mg | Freq: Four times a day (QID) | INTRAMUSCULAR | Status: DC | PRN
  Administered 2011-08-02: 50 mg via INTRAMUSCULAR
  Filled 2011-08-01 (×2): qty 1

## 2011-08-01 MED ORDER — ACETAMINOPHEN 325 MG PO TABS: 650.0000 mg | ORAL_TABLET | ORAL | Status: DC | PRN

## 2011-08-01 MED ORDER — LACTATED RINGERS IV SOLN
INTRAVENOUS | Status: DC
  Administered 2011-08-01: 13:00:00 via INTRAVENOUS

## 2011-08-01 NOTE — Progress Notes (Signed)
Dr Stefano Gaul notified to clarify order to start Pitocin. Stated to start Pitocin at 6am and to also do CBG's q4hrs once in active labor.

## 2011-08-01 NOTE — H&P (Signed)
[name] is a 30 y.o. female presenting for IOL, secondary to BPP at office today 4/10. Denies ctx, VB or LOF, +FM.  Pregnancy significant for:  Gestational diabetes--on Glyburide 2.5 mg q day  GBS positive  Hyperemesis  Ptyalism  Maternal Medical History:  Reason for admission: Reason for admission: nausea.  IOL secondary to BPP 4/10  Contractions: denies  Fetal activity: Perceived fetal activity is normal.    Prenatal complications: no prenatal complications Prenatal Complications - Diabetes: gestational. Diabetes is managed by oral agent (monotherapy).      OB History    Grav Para Term Preterm Abortions TAB SAB Ect Mult Living   2 1 1  0 0 0 0 0 0 1     Past Medical History  Diagnosis Date  . Urinary tract infection   . Trichomonas 02/2011    treated  . Sinusitis   . Gestational diabetes   . Abnormal Pap smear    Past Surgical History  Procedure Date  . No past surgeries    Family History: family history includes Alcohol abuse in her maternal aunt; Cancer in her maternal uncle; Diabetes in her father and mother; Heart attack in her maternal uncle and paternal uncle; Hypertension in her father and mother; Kidney disease in her maternal uncle and paternal uncle; Peripheral vascular disease in her mother; Seizures in her maternal aunt; and Thyroid disease in her mother and paternal aunt.  There is no history of Anesthesia problems, and Hypotension, and Malignant hyperthermia, and Pseudochol deficiency, . Social History:  reports that she has never smoked. She has never used smokeless tobacco. She reports that she does not drink alcohol or use illicit drugs.  Review of Systems  Gastrointestinal: Positive for nausea.  Psychiatric/Behavioral: The patient is nervous/anxious.   All other systems reviewed and are negative.    Dilation: 1.5 Effacement (%): 50 Station: -1 Exam by:: [name] cnm Blood pressure 153/80, pulse 102, temperature 98.1 F (36.7 C),  temperature source Oral, resp. rate 20, height 5\' 3"  (1.6 m), weight 102.513 kg (226 lb). Maternal Exam:  Uterine Assessment: Contraction strength is mild.  Contraction duration is 60 seconds. Contraction frequency is irregular.   Abdomen: Patient reports no abdominal tenderness. Fundal height is aga.   Fetal presentation: vertex  Introitus: Normal vulva. Normal vagina.  Cervix: Cervix evaluated by digital exam.     Fetal Exam Fetal Monitor Review: Mode: ultrasound.   Baseline rate: 150.  Variability: moderate (6-25 bpm).   Pattern: accelerations present and no decelerations.    Fetal State Assessment: Category I - tracings are normal.     Physical Exam  Nursing note and vitals reviewed. Constitutional: She is oriented to person, place, and time. She appears well-developed and well-nourished.  Neck: Normal range of motion.  Cardiovascular: Normal rate.   Respiratory: Effort normal.  GI: Soft.  Genitourinary: Vagina normal.  Musculoskeletal: Normal range of motion.  Neurological: She is alert and oriented to person, place, and time. She has normal reflexes.  Skin: Skin is warm and dry.  Psychiatric: She has a normal mood and affect. Her behavior is normal.    Prenatal labs: ABO, Rh: A/Positive/-- (05/14 0000) Antibody: Negative (05/14 0000) Rubella: Immune (05/14 0000) RPR: NON REACTIVE (11/18 1810)  HBsAg: Negative (05/14 0000)  HIV: Non-reactive (05/14 0000)  GBS: Positive (11/18 0000)   Assessment/Plan: IUP at [redacted]w[redacted]d GDM  Non-reassuring fetal testing    Admit to birthing suites cytotec PV CBG q4hour Analgesia/anesthesia PRN zofran and phenergan as  pt takes    Natarsha Hurwitz M 08/01/2011, 1:32 PM

## 2011-08-02 ENCOUNTER — Encounter (HOSPITAL_COMMUNITY): Payer: Self-pay | Admitting: Obstetrics and Gynecology

## 2011-08-02 ENCOUNTER — Inpatient Hospital Stay (HOSPITAL_COMMUNITY): Payer: Medicaid Other | Admitting: Anesthesiology

## 2011-08-02 ENCOUNTER — Encounter (HOSPITAL_COMMUNITY): Payer: Self-pay | Admitting: Anesthesiology

## 2011-08-02 LAB — GLUCOSE, CAPILLARY
Glucose-Capillary: 85 mg/dL (ref 70–99)
Glucose-Capillary: 114 mg/dL — ABNORMAL HIGH (ref 70–99)

## 2011-08-02 MED ORDER — PRENATAL PLUS 27-1 MG PO TABS
1.0000 | ORAL_TABLET | Freq: Every day | ORAL | Status: DC
  Administered 2011-08-02 – 2011-08-04 (×3): 1 via ORAL
  Filled 2011-08-02 (×3): qty 1

## 2011-08-02 MED ORDER — DIPHENHYDRAMINE HCL 25 MG PO CAPS: 25.0000 mg | ORAL_CAPSULE | Freq: Four times a day (QID) | ORAL | Status: DC | PRN

## 2011-08-02 MED ORDER — ZOLPIDEM TARTRATE 5 MG PO TABS: 5.0000 mg | ORAL_TABLET | Freq: Every evening | ORAL | Status: DC | PRN

## 2011-08-02 MED ORDER — WITCH HAZEL-GLYCERIN EX PADS: 1.0000 "application " | MEDICATED_PAD | CUTANEOUS | Status: DC | PRN

## 2011-08-02 MED ORDER — EPHEDRINE 5 MG/ML INJ: 10.0000 mg | INTRAVENOUS | Status: DC | PRN

## 2011-08-02 MED ORDER — OXYTOCIN 10 UNIT/ML IJ SOLN
40.0000 [IU] | INTRAVENOUS | Status: DC
  Administered 2011-08-02: 40 [IU] via INTRAVENOUS
  Filled 2011-08-02: qty 4

## 2011-08-02 MED ORDER — DIPHENHYDRAMINE HCL 50 MG/ML IJ SOLN: 12.5000 mg | INTRAMUSCULAR | Status: DC | PRN

## 2011-08-02 MED ORDER — SIMETHICONE 80 MG PO CHEW: 80.0000 mg | CHEWABLE_TABLET | ORAL | Status: DC | PRN

## 2011-08-02 MED ORDER — DIBUCAINE 1 % RE OINT: 1.0000 "application " | TOPICAL_OINTMENT | RECTAL | Status: DC | PRN

## 2011-08-02 MED ORDER — OXYCODONE-ACETAMINOPHEN 5-325 MG PO TABS: 1.0000 | ORAL_TABLET | ORAL | Status: DC | PRN

## 2011-08-02 MED ORDER — BENZOCAINE-MENTHOL 20-0.5 % EX AERO: 1.0000 "application " | INHALATION_SPRAY | CUTANEOUS | Status: DC | PRN

## 2011-08-02 MED ORDER — MEDROXYPROGESTERONE ACETATE 150 MG/ML IM SUSP: 150.0000 mg | INTRAMUSCULAR | Status: DC | PRN

## 2011-08-02 MED ORDER — LANOLIN HYDROUS EX OINT: TOPICAL_OINTMENT | CUTANEOUS | Status: DC | PRN

## 2011-08-02 MED ORDER — SODIUM BICARBONATE 8.4 % IV SOLN
INTRAVENOUS | Status: DC | PRN
  Administered 2011-08-02: 4 mL via EPIDURAL
  Administered 2011-08-02: 03:00:00 via EPIDURAL

## 2011-08-02 MED ORDER — FENTANYL 2.5 MCG/ML BUPIVACAINE 1/10 % EPIDURAL INFUSION (WH - ANES)
14.0000 mL/h | INTRAMUSCULAR | Status: DC
  Filled 2011-08-02: qty 60

## 2011-08-02 MED ORDER — TETANUS-DIPHTH-ACELL PERTUSSIS 5-2.5-18.5 LF-MCG/0.5 IM SUSP
0.5000 mL | Freq: Once | INTRAMUSCULAR | Status: DC
  Filled 2011-08-02: qty 0.5

## 2011-08-02 MED ORDER — MEASLES, MUMPS & RUBELLA VAC ~~LOC~~ INJ
0.5000 mL | INJECTION | Freq: Once | SUBCUTANEOUS | Status: DC
  Filled 2011-08-02: qty 0.5

## 2011-08-02 MED ORDER — SENNOSIDES-DOCUSATE SODIUM 8.6-50 MG PO TABS: 2.0000 | ORAL_TABLET | Freq: Every day | ORAL | Status: DC

## 2011-08-02 MED ORDER — MISOPROSTOL 200 MCG PO TABS: 800.0000 ug | ORAL_TABLET | Freq: Once | ORAL | Status: DC

## 2011-08-02 MED ORDER — MISOPROSTOL 200 MCG PO TABS
ORAL_TABLET | ORAL | Status: AC
  Administered 2011-08-02: 800 ug
  Filled 2011-08-02: qty 4

## 2011-08-02 MED ORDER — ONDANSETRON HCL 4 MG PO TABS: 4.0000 mg | ORAL_TABLET | ORAL | Status: DC | PRN

## 2011-08-02 MED ORDER — EPHEDRINE 5 MG/ML INJ
10.0000 mg | INTRAVENOUS | Status: DC | PRN
  Filled 2011-08-02: qty 4

## 2011-08-02 MED ORDER — ONDANSETRON HCL 4 MG/2ML IJ SOLN: 4.0000 mg | INTRAMUSCULAR | Status: DC | PRN

## 2011-08-02 MED ORDER — PRENATAL PLUS 27-1 MG PO TABS: 1.0000 | ORAL_TABLET | Freq: Every day | ORAL | Status: DC

## 2011-08-02 MED ORDER — SENNOSIDES-DOCUSATE SODIUM 8.6-50 MG PO TABS
2.0000 | ORAL_TABLET | Freq: Every day | ORAL | Status: DC
  Administered 2011-08-02 – 2011-08-03 (×2): 2 via ORAL

## 2011-08-02 MED ORDER — OXYCODONE-ACETAMINOPHEN 5-325 MG PO TABS
1.0000 | ORAL_TABLET | ORAL | Status: DC | PRN
  Administered 2011-08-02: 1 via ORAL
  Administered 2011-08-03 (×3): 2 via ORAL
  Filled 2011-08-02 (×2): qty 2
  Filled 2011-08-02: qty 1
  Filled 2011-08-02 (×3): qty 2

## 2011-08-02 MED ORDER — BUTORPHANOL TARTRATE 2 MG/ML IJ SOLN
1.0000 mg | INTRAMUSCULAR | Status: DC | PRN
  Administered 2011-08-02: 1 mg via INTRAVENOUS
  Filled 2011-08-02: qty 1

## 2011-08-02 MED ORDER — MEASLES, MUMPS & RUBELLA VAC ~~LOC~~ INJ: 0.5000 mL | INJECTION | Freq: Once | SUBCUTANEOUS | Status: DC

## 2011-08-02 MED ORDER — PHENYLEPHRINE 40 MCG/ML (10ML) SYRINGE FOR IV PUSH (FOR BLOOD PRESSURE SUPPORT): 80.0000 ug | PREFILLED_SYRINGE | INTRAVENOUS | Status: DC | PRN

## 2011-08-02 MED ORDER — TETANUS-DIPHTH-ACELL PERTUSSIS 5-2.5-18.5 LF-MCG/0.5 IM SUSP: 0.5000 mL | Freq: Once | INTRAMUSCULAR | Status: DC

## 2011-08-02 MED ORDER — LACTATED RINGERS IV SOLN
500.0000 mL | Freq: Once | INTRAVENOUS | Status: AC
  Administered 2011-08-02: 500 mL via INTRAVENOUS

## 2011-08-02 MED ORDER — IBUPROFEN 600 MG PO TABS
600.0000 mg | ORAL_TABLET | Freq: Four times a day (QID) | ORAL | Status: DC
  Administered 2011-08-02 – 2011-08-04 (×8): 600 mg via ORAL
  Filled 2011-08-02 (×9): qty 1

## 2011-08-02 MED ORDER — IBUPROFEN 600 MG PO TABS: 600.0000 mg | ORAL_TABLET | Freq: Four times a day (QID) | ORAL | Status: DC

## 2011-08-02 MED ORDER — MAGNESIUM HYDROXIDE 400 MG/5ML PO SUSP: 30.0000 mL | ORAL | Status: DC | PRN

## 2011-08-02 NOTE — Progress Notes (Signed)
UR Chart review completed.  

## 2011-08-02 NOTE — Anesthesia Postprocedure Evaluation (Signed)
  Anesthesia Post-op Note  Patient: [name]  Procedure(s) Performed: * No procedures listed *  Patient Location: PACU and Women's Unit  Anesthesia Type: Epidural  Level of Consciousness: awake, alert  and oriented  Airway and Oxygen Therapy: Patient Spontanous Breathing  Post-op Pain: none  Post-op Assessment: Post-op Vital signs reviewed  Post-op Vital Signs: Reviewed and stable  Complications: No apparent anesthesia complications

## 2011-08-02 NOTE — Progress Notes (Signed)
Updated by Chip Boer to let me know she spoke with Dr Stefano Gaul regarding pt. States to start Pitocin after epidural if needed.

## 2011-08-02 NOTE — Anesthesia Preprocedure Evaluation (Addendum)
Anesthesia Evaluation  Patient identified by MRN, date of birth, ID band Patient awake    Reviewed: Allergy & Precautions, H&P , Patient's Chart, lab work & pertinent test results  Airway Mallampati: IV TM Distance: >3 FB Neck ROM: full  Mouth opening: Limited Mouth Opening Comment: Difficult airway alert Dental  (+) Teeth Intact   Pulmonary  clear to auscultation        Cardiovascular regular Normal    Neuro/Psych    GI/Hepatic GERD-  ,  Endo/Other  Diabetes mellitus-, GestationalMorbid obesity  Renal/GU      Musculoskeletal   Abdominal   Peds  Hematology   Anesthesia Other Findings       Reproductive/Obstetrics (+) Pregnancy                          Anesthesia Physical Anesthesia Plan  ASA: III  Anesthesia Plan: Epidural   Post-op Pain Management:    Induction:   Airway Management Planned:   Additional Equipment:   Intra-op Plan:   Post-operative Plan:   Informed Consent: I have reviewed the patients History and Physical, chart, labs and discussed the procedure including the risks, benefits and alternatives for the proposed anesthesia with the patient or authorized representative who has indicated his/her understanding and acceptance.   Dental Advisory Given  Plan Discussed with:   Anesthesia Plan Comments: (Labs checked- platelets confirmed with RN in room. Fetal heart tracing, per RN, reported to be stable enough for sitting procedure. Discussed epidural, and patient consents to the procedure:  included risk of possible headache,backache, failed block, allergic reaction, and nerve injury. This patient was asked if she had any questions or concerns before the procedure started. )        Anesthesia Quick Evaluation

## 2011-08-02 NOTE — Progress Notes (Signed)
  Subjective: Increased pain with UCs, sitting on side of bed, requesting epidural.  Objective: BP 140/84  Pulse 65  Temp(Src) 98.3 F (36.8 C) (Oral)  Resp 18  Ht 5\' 3"  (1.6 m)  Wt 102.513 kg (226 lb)  BMI 40.03 kg/m2      FHT:  Baseline 140s, negative CST, no decels UCs q2-3 min Cervix 4 cm, anterior now, 100%, vtx, -1, BBOW--I checked patient due to cervix being very posterior on previous exam, with nurse having difficulty assessing exam.   Assessment / Plan: Early labor, s/p cytotech Stable FHR  Updated Dr. Marlinda Mike OK'd Will evaluate need for pitocin after epidural placed. Close observation of fetal status.  [name] 08/02/2011, 2:23 AM

## 2011-08-02 NOTE — Anesthesia Procedure Notes (Signed)
Epidural Patient location during procedure: OB  Preanesthetic Checklist Completed: patient identified, site marked, surgical consent, pre-op evaluation, timeout performed, IV checked, risks and benefits discussed and monitors and equipment checked  Epidural Patient position: sitting Prep: site prepped and draped and DuraPrep Patient monitoring: continuous pulse ox and blood pressure Approach: midline Injection technique: LOR air  Needle:  Needle type: Tuohy  Needle gauge: 17 G Needle length: 9 cm Needle insertion depth: 6 cm Catheter type: closed end flexible Catheter size: 19 Gauge Catheter at skin depth: 12 cm Test dose: negative  Assessment Events: blood not aspirated, injection not painful, no injection resistance, negative IV test and no paresthesia  Additional Notes Dosing of Epidural:  1st dose, through catheter ............................................Marland Kitchen epi 1:200K + Xylocaine 40 mg  2nd dose, through catheter, after waiting 3 minutes...Marland KitchenMarland Kitchenepi 1:200K + Xylocaine 40 mg    ( 2% Xylo charted as a single dose in Epic Meds for ease of charting; actual dosing was fractionated as above, for saftey's sake)  As each dose occurred, patient was free of IV sx; and patient exhibited no evidence of SA injection.  Patient is more comfortable after epidural dosed. Please see RN's note for documentation of vital signs,and FHR which are stable.

## 2011-08-03 LAB — CBC
MCH: 29.5 pg (ref 26.0–34.0)
MCV: 86 fL (ref 78.0–100.0)
Platelets: 199 10*3/uL (ref 150–400)
RBC: 3.49 MIL/uL — ABNORMAL LOW (ref 3.87–5.11)

## 2011-08-03 MED ORDER — MEDROXYPROGESTERONE ACETATE 150 MG/ML IM SUSP
150.0000 mg | Freq: Once | INTRAMUSCULAR | Status: AC
  Administered 2011-08-04: 150 mg via INTRAMUSCULAR
  Filled 2011-08-03: qty 1

## 2011-08-03 MED ORDER — CYCLOBENZAPRINE HCL 10 MG PO TABS
10.0000 mg | ORAL_TABLET | Freq: Three times a day (TID) | ORAL | Status: DC | PRN
  Administered 2011-08-03 (×2): 10 mg via ORAL
  Filled 2011-08-03 (×2): qty 1

## 2011-08-03 NOTE — Progress Notes (Signed)
Patient ID: [name], female   DOB: 09/25/1980, 31 y.o.   MRN: [id number] Post Partum Day 1 Subjective:  up ad lib without syncope, voiding, tolerating PO, + flatus, sitting on edge of bed C/o abdominal pain and vaginal soreness and back pain, had percocet at 5am, will give some now, and add flexeril PO Bottle feeding Mood stable, bonding well   Objective: Blood pressure 128/93, pulse 90, temperature 98 F (36.7 C), temperature source Oral, resp. rate 20, height 5\' 3"  (1.6 m), weight 102.513 kg (226 lb), SpO2 100.00%, unknown if currently breastfeeding.  Physical Exam:  General: alert and no distress Lungs: CTAB Heart: RRR Breasts:soft Lochia: appropriate Uterine Fundus: firm Perineum: deferred, pt declined DVT Evaluation: No evidence of DVT seen on physical exam. Negative Homan's sign. No significant calf/ankle edema.   Basename 08/03/11 0457 08/01/11 1330  HGB 10.3* 12.2  HCT 30.0* 35.7*    Assessment/Plan: Plan for discharge tomorrow and Contraception plans depo-provera, and circ at office Add flexeril PO for back pain, pt to take hot bath and try walking in halls      LOS: 2 days   [name] M 08/03/2011, 9:06 AM

## 2011-08-04 DIAGNOSIS — R600 Localized edema: Secondary | ICD-10-CM | POA: Diagnosis not present

## 2011-08-04 DIAGNOSIS — K5900 Constipation, unspecified: Secondary | ICD-10-CM | POA: Diagnosis present

## 2011-08-04 MED ORDER — IBUPROFEN 600 MG PO TABS: 600.0000 mg | ORAL_TABLET | Freq: Four times a day (QID) | ORAL | Status: AC

## 2011-08-04 MED ORDER — OXYCODONE-ACETAMINOPHEN 5-325 MG PO TABS: 1.0000 | ORAL_TABLET | ORAL | Status: AC | PRN

## 2011-08-04 MED ORDER — CYCLOBENZAPRINE HCL 10 MG PO TABS: 10.0000 mg | ORAL_TABLET | Freq: Three times a day (TID) | ORAL | Status: AC | PRN

## 2011-08-04 NOTE — Discharge Summary (Signed)
Obstetric Discharge Summary Reason for Admission: induction of labor, poorly controlled GDM, noncompliance with recommended medical regimen Prenatal Procedures: NST and ultrasound Intrapartum Procedures: spontaneous vaginal delivery and GBS prophylaxis Postpartum Procedures: DepoProvera Complications-Operative and Postpartum: 1st periurethral degree perineal laceration Hemoglobin  Date Value Range Status  08/03/2011 10.3* 12.0-15.0 (g/dL) Final     HCT  Date Value Range Status  08/03/2011 30.0* 36.0-46.0 (%) Final    Discharge Diagnoses: Term Pregnancy-delivered and formula-feeding; constipation; BLE edema; obese; GDM-glyburide in pregnancy; hyperemesis in pregnancy; h/o ptyalism this pregnancy.  Discharge Information: Date: 08/04/2011 Activity: pelvic rest Diet: routine and Potassium rich while on HCTZ this next week;  Medications: PNV, Ibuprofen, Colace, Percocet and HCTZ 25mg  po qd x7d; Flexeril prn; Tylenol 1st and if doesn't help, can take percocet; Miralax bid until bowels moving more regularly Condition: stable Instructions: refer to practice specific booklet Discharge to: home Follow-up Information    Follow up with [facility] on 08/14/2011. (for newborn's circumcision)       Follow up with [facility]. (make appointment for 5 weeks postpartum when at circumcision appointment, or call  as needed)          Newborn Data: Live born female (delivery by Nigel Bridgeman, CNM) Birth Weight: 7 lb 2.1 oz (3235 g) APGAR: 9, 9  Home with mother.  Outpatient circumcision on 08/14/11.    Interested in [facility].  STEELMAN,CANDICE H 08/04/2011, 10:42 AM

## 2011-08-04 NOTE — Progress Notes (Signed)
D/c instructions for Mom and baby reviewed with mom and s.o.  Both state understanding of same.  No home equipment needed.  Wheelchair to car with staff without incident.  D/c;d home with s.o.

## 2011-08-04 NOTE — Progress Notes (Signed)
Post Partum Day 2 Subjective: up ad lib, voiding, tolerating PO and sm BM night before induction, but very constipated, and increased flatulence.  Constipation prior to this episode as well last few weeks; c/o "knot" Lt buttocks--area where had IM pain medicine w/in the last few days--some help w/ K-pad; inner thighs sore--flexeril helping; LE edema more significant while hospitalized.  No PIH s/s.  VB lighter. Desires Depo before d/c, but interested in Nexplanon.  Significant other at bs and supportive.  Formula-feeding.  No n/v.  Objective: Blood pressure 132/85, pulse 96, temperature 97.8 F (36.6 C), temperature source Oral, resp. rate 18, height 5\' 3"  (1.6 m), weight 102.513 kg (226 lb), SpO2 100.00%, unknown if currently breastfeeding. Fasting CBG-85 (122 yesterday) Physical Exam:  General: alert, cooperative, mild distress and moderately obese Lochia: appropriate, rubra Uterine Fundus: firm, u/-1; Abdomen: suspected hernia(s) above umbilicus Incision: n/a DVT Evaluation: No evidence of DVT seen on physical exam. Negative Homan's sign. Calf/Ankle edema is present. (2+ bilaterally--pitting)   Basename 08/03/11 0457 08/01/11 1330  HGB 10.3* 12.2  HCT 30.0* 35.7*    Assessment/Plan: Discharge home and Contraception Depo before d/c today. Rx:  HCTZ 25mg  po qd x7d--banana each day; Motrin, Flexeril, Percocet(only if other meds in adjunct w/ Tylenol not helping); Colace 200mg  po bid; Miralax bid until bowels more regular; Mylicon prn. F/u 5 weeks at [facility] or prn.  Circ on 08/14/11.  Will recheck abdomen at 5 weeks and if still suspicious for hernia, will refer to Gen Sx. Pt to check Fasting cbg tomorrow and Wed morning and call office w/ results--stop Glyburide.  LOS: 3 days   Elleen Coulibaly H 08/04/2011, 10:49 AM

## 2011-08-05 ENCOUNTER — Inpatient Hospital Stay (HOSPITAL_COMMUNITY): Admission: RE | Admit: 2011-08-05 | Payer: Medicaid Other | Source: Ambulatory Visit

## 2011-12-17 IMAGING — US US FETAL BPP W/O NONSTRESS
1 series · 8 of 8 positions shown · non-contrast
Comparison: none

[Series 1: us fetal bpp w/o nonstress · non-contrast · 8 acquisitions, 8 frames shown]
[im 1/8]
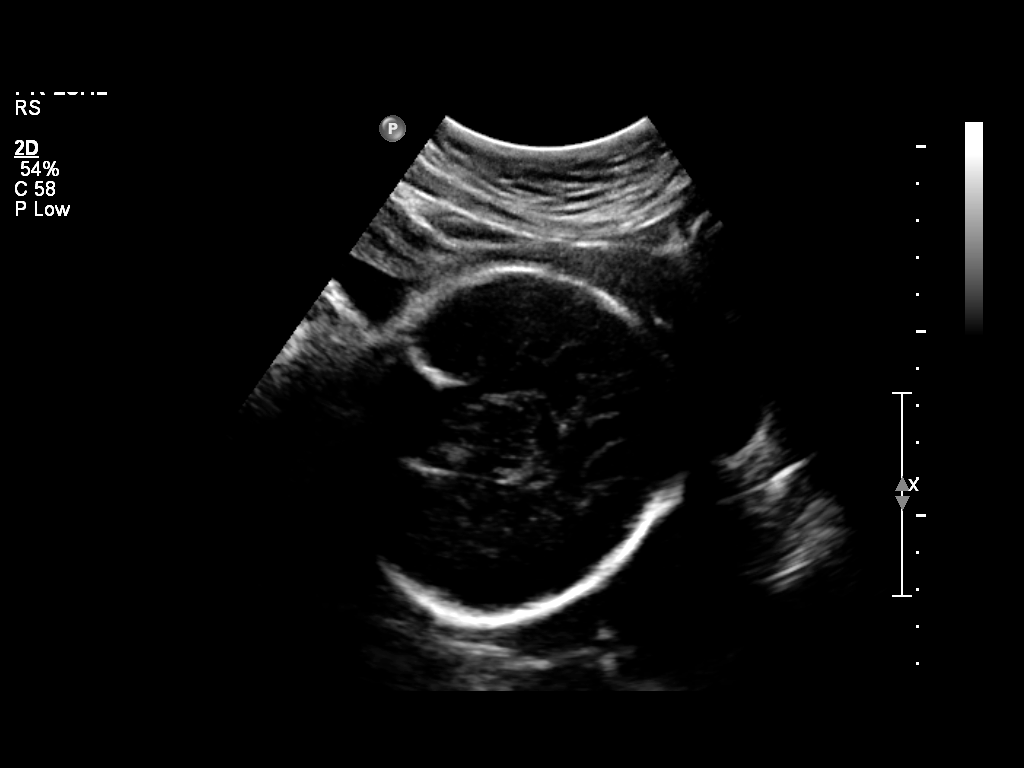
[im 2/8]
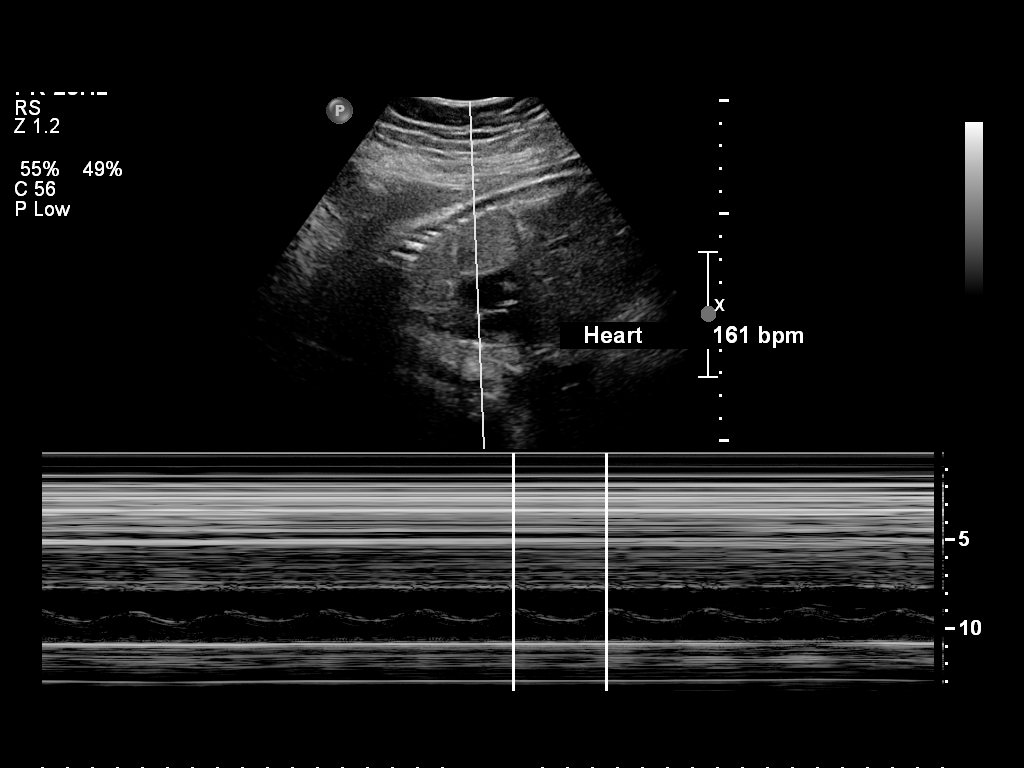
[im 3/8]
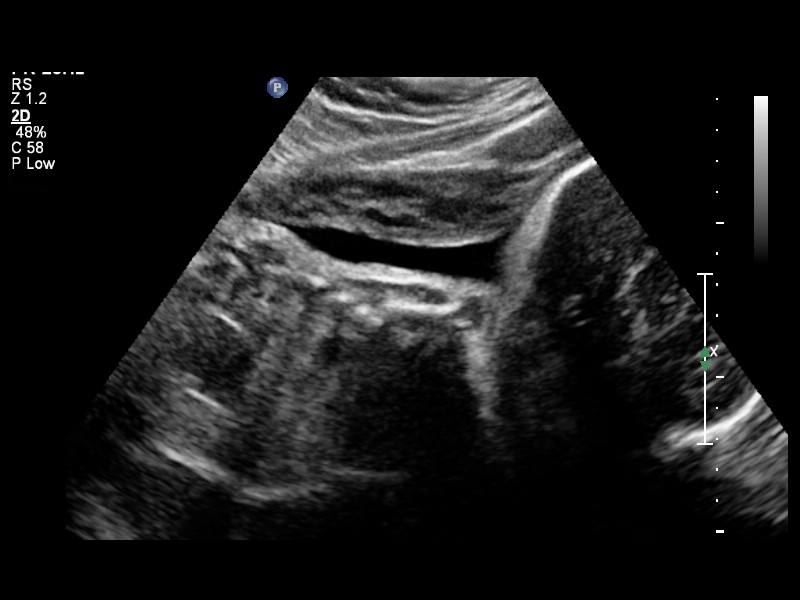
[im 4/8]
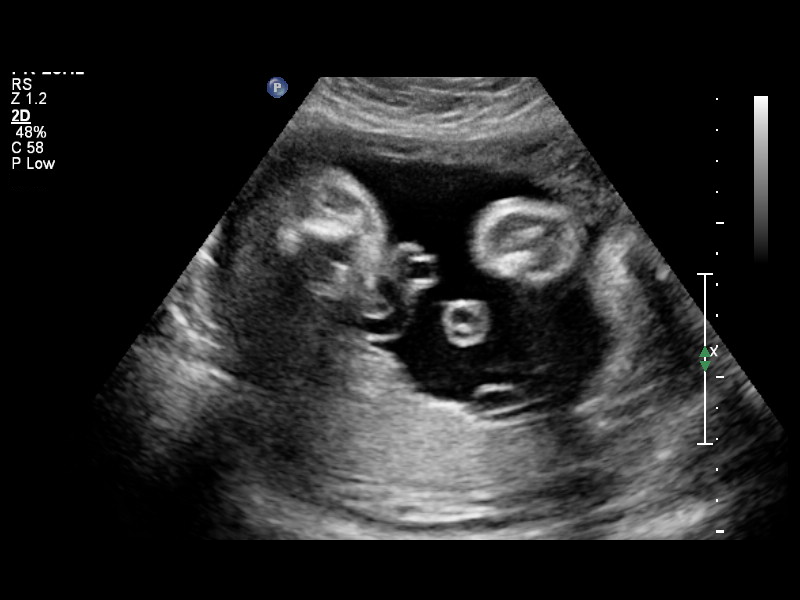
[im 5/8]
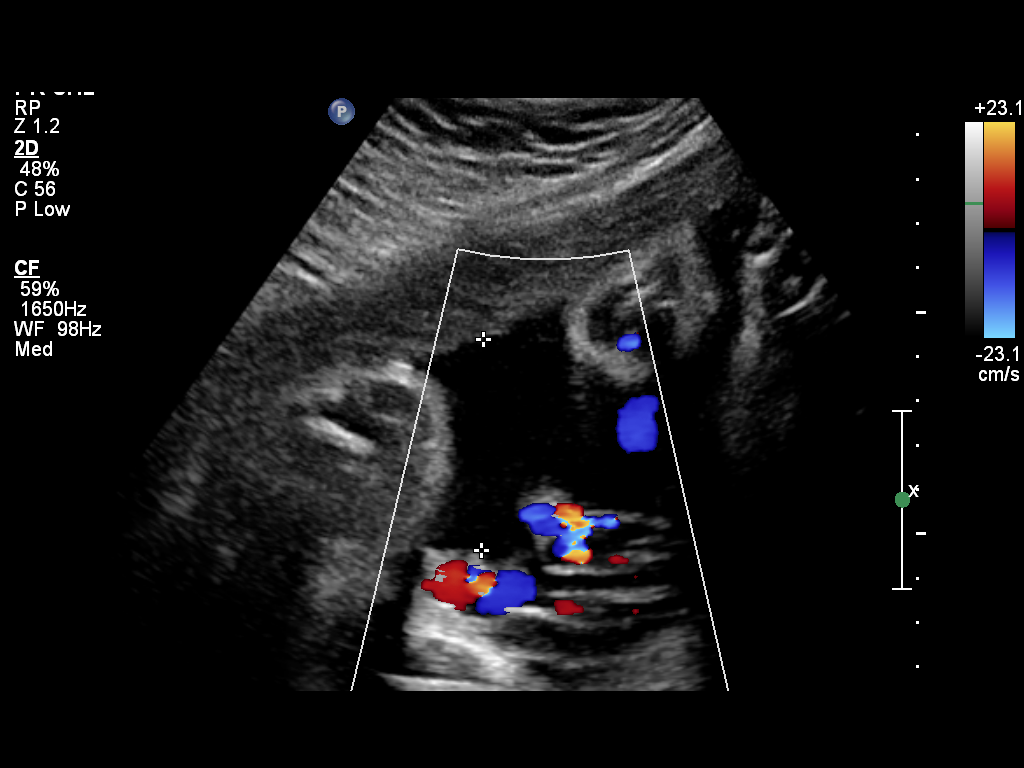
[im 6/8]
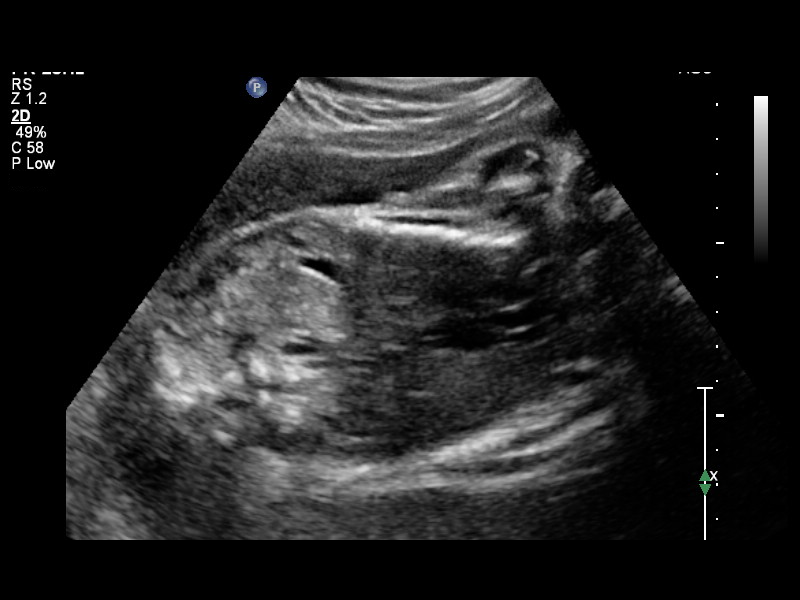
[im 7/8]
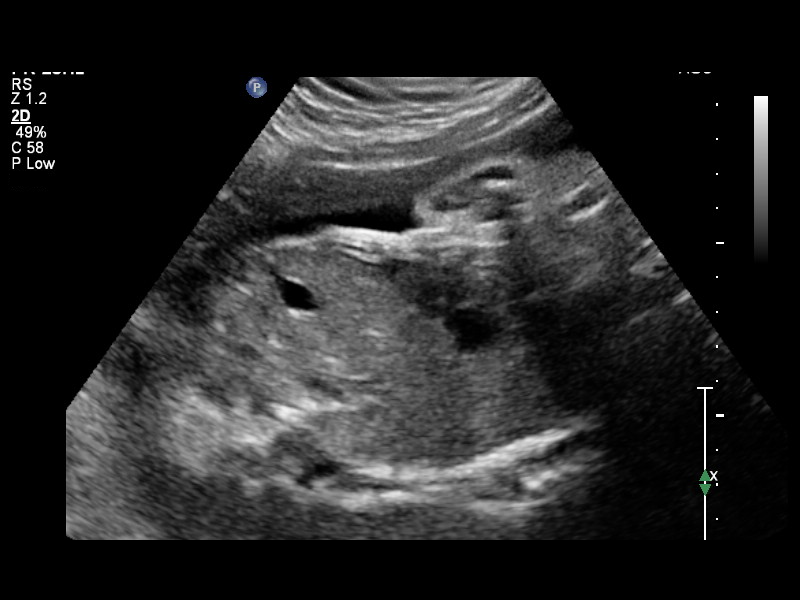
[im 8/8]
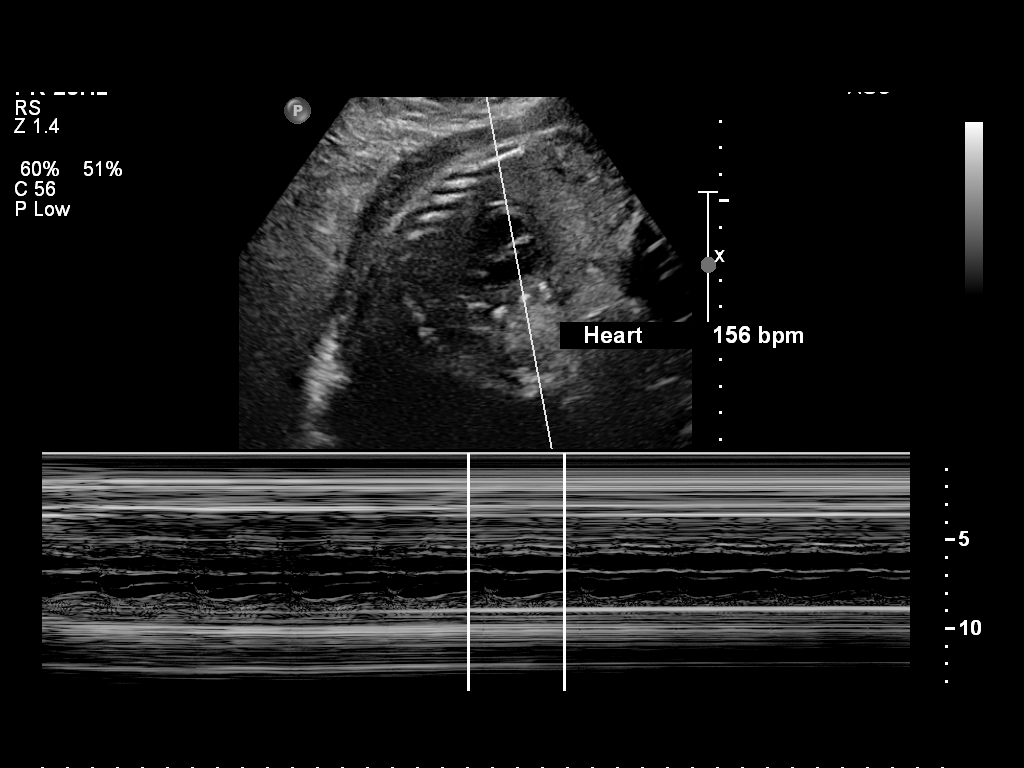

[8 of 8 positions shown; findings below may reference images not displayed]

OBSTETRICS REPORT
                      (Signed Final 06/10/2011 [DATE])

Procedures

Indications

 Non-reactive NST
 Diabetes - Pregestational
 Assess fetal well being
 Nausea and vomiting
Fetal Evaluation

 Fetal Heart Rate:  161                          bpm
 Cardiac Activity:  Observed
 Presentation:      Cephalic

 Comment:    Fetal breathing seen; however, not sustained for 30
             seconds.  BPP score [DATE] in 30 minutes.

 Amniotic Fluid
 AFI FV:      Subjectively within normal limits
                                             Larg Pckt:     4.7  cm
Biophysical Evaluation

 Amniotic F.V:   Pocket => 2 cm two         F. Tone:        Observed
                 planes
 F. Movement:    Observed                   Score:          [DATE]
 F. Breathing:   Not Observed
Gestational Age

 LMP:           31w 1d        Date:  11/04/10                 EDD:   08/11/11
 Best:          31w 1d     Det. By:  LMP  (11/04/10)          EDD:   08/11/11
Impression

 Single live IUP in cephalic presentation.  BPP [DATE] (sustained
 breathing not observed).

 questions or concerns.

## 2012-01-02 ENCOUNTER — Encounter: Payer: Self-pay | Admitting: Obstetrics and Gynecology

## 2012-01-02 ENCOUNTER — Ambulatory Visit (INDEPENDENT_AMBULATORY_CARE_PROVIDER_SITE_OTHER): Payer: Medicaid Other | Admitting: Obstetrics and Gynecology

## 2012-01-02 VITALS — BP 120/76 | Ht 64.0 in | Wt 233.0 lb

## 2012-01-02 DIAGNOSIS — R58 Hemorrhage, not elsewhere classified: Secondary | ICD-10-CM

## 2012-01-02 DIAGNOSIS — N938 Other specified abnormal uterine and vaginal bleeding: Secondary | ICD-10-CM

## 2012-01-02 DIAGNOSIS — N926 Irregular menstruation, unspecified: Secondary | ICD-10-CM

## 2012-01-02 NOTE — Progress Notes (Signed)
S:30 yo MBF on Depo-Provera since birth of son 08/02/11. 2nd injection February 2013. Noticed spotting beginning of March and this past week had heavier bleeding similar to cycle.  O: General Appearance: Alert, appropriate appearance for age. No acute distress HEENT: Grossly normal Neck / Thyroid: Supple, no masses, nodes or enlargement Lungs: clear to auscultation bilaterally Back: No CVA tenderness Cardiovascular: Regular rate and rhythm. S1, S2, no murmur Gastrointestinal: Soft, non-tender, no masses or organomegaly Pelvic Exam: Vulva and vagina appear normal. Bimanual exam reveals normal uterus and adnexa. Rectovaginal: not indicated Lymphatic Exam: Non-palpable nodes in neck, clavicular, axillary, or inguinal regions Skin: no rash or abnormalities  A: DUB probably 2nd Depo-Provera but ill R/O other causes  P: TSH, CBC, TSH, Ultrasound      Follow-up for results and Pap       The following contraception methods have been reviewed:  Mirena With expected benefits of: lack of estrogen,5 year duration, high reliability at 99.9%, reversibility, reduction or cessation of menstrual flow and improvement or resolution of dysmenorrhea/pelvic pain. Risks at the time of insertion were reviewed: dysfunctional uterine bleeding lasting up to 6 months, infection and uterine perforation which may require laparoscopy for retrieval.  Instructions for day of insertion discussed:  yes avoidance of unprotected intercourse 2 weeks prior, best to schedule during a menstrual cycle and use of Ibuprofen 600 mg 1 hour before appointment.  Nexplanon  With expected benefits of: 3 year duration, high reliability at 99.9%, lack of estrogen and ease of insertion. Risks of DUB and possible difficult removal discussed.

## 2012-01-02 NOTE — Progress Notes (Deleted)
The patient reports:she complains of Bleeding with Depo   Contraception:Depo-Provera  Last mammogram: patient has never had a mammogram  Last pap: {findings; last pap:13140::"not applicable"} {MONTH:22386}  20***  GC/Chlamydia cultures offered: {Desc; requested/declined/undecided:14580} HIV/RPR/HbsAg offered:  {Desc; requested/declined/undecided:14580} HSV 1 and 2 glycoprotein offered: {Desc; requested/declined/undecided:14580}  Menstrual cycle regular and monthly: {yes no:315493::"Yes"} Menstrual flow normal: {yes no:315493::"Yes"}  Urinary symptoms: {Symptoms; urinary:12437} Normal bowel movements: {yes no:315493::"Yes"} Reports abuse at home: {yes no:315493::"Yes"}

## 2012-01-03 LAB — TSH: TSH: 1.943 u[IU]/mL (ref 0.350–4.500)

## 2012-01-03 LAB — CBC
MCH: 29.6 pg (ref 26.0–34.0)
MCV: 88.1 fL (ref 78.0–100.0)

## 2012-01-03 LAB — PROLACTIN: Prolactin: 7.8 ng/mL

## 2012-01-16 ENCOUNTER — Encounter: Payer: Medicaid Other | Admitting: Obstetrics and Gynecology

## 2012-01-16 ENCOUNTER — Other Ambulatory Visit: Payer: Medicaid Other

## 2012-01-16 ENCOUNTER — Other Ambulatory Visit: Payer: Self-pay | Admitting: Obstetrics and Gynecology

## 2012-01-16 NOTE — Telephone Encounter (Signed)
Triage/epic 

## 2012-01-22 MED ORDER — MEDROXYPROGESTERONE ACETATE 150 MG/ML IM SUSP: 150.0000 mg | Freq: Once | INTRAMUSCULAR | Status: DC

## 2012-01-28 ENCOUNTER — Ambulatory Visit (INDEPENDENT_AMBULATORY_CARE_PROVIDER_SITE_OTHER): Payer: Medicaid Other

## 2012-01-28 ENCOUNTER — Ambulatory Visit (INDEPENDENT_AMBULATORY_CARE_PROVIDER_SITE_OTHER): Payer: Medicaid Other | Admitting: Obstetrics and Gynecology

## 2012-01-28 ENCOUNTER — Other Ambulatory Visit: Payer: Self-pay | Admitting: Obstetrics and Gynecology

## 2012-01-28 ENCOUNTER — Encounter: Payer: Self-pay | Admitting: Obstetrics and Gynecology

## 2012-01-28 VITALS — BP 110/76 | Ht 64.0 in | Wt 240.0 lb

## 2012-01-28 DIAGNOSIS — N938 Other specified abnormal uterine and vaginal bleeding: Secondary | ICD-10-CM

## 2012-01-28 DIAGNOSIS — Z01419 Encounter for gynecological examination (general) (routine) without abnormal findings: Secondary | ICD-10-CM

## 2012-01-28 DIAGNOSIS — N83209 Unspecified ovarian cyst, unspecified side: Secondary | ICD-10-CM

## 2012-01-28 DIAGNOSIS — Z0000 Encounter for general adult medical examination without abnormal findings: Secondary | ICD-10-CM

## 2012-01-28 DIAGNOSIS — N939 Abnormal uterine and vaginal bleeding, unspecified: Secondary | ICD-10-CM

## 2012-01-28 DIAGNOSIS — N926 Irregular menstruation, unspecified: Secondary | ICD-10-CM

## 2012-01-28 NOTE — Patient Instructions (Signed)
Patient Education Materials to be provided at check out (*indicates is located in accordion folder):  *Mirena  

## 2012-01-28 NOTE — Progress Notes (Signed)
The patient reports:normal menses, no abnormal bleeding, pelvic pain or discharge  Contraception:Depo-Provera  Last mammogram: patient has never had a mammogram  Last pap: normal May  2012  GC/Chlamydia cultures offered: requested HIV/RPR/HbsAg offered:  requested HSV 1 and 2 glycoprotein offered: requested  Menstrual cycle regular and monthly: No: depo Menstrual flow normal: No: depo  Urinary symptoms: none Normal bowel movements: Yes Reports abuse at home: No  Subjective:    [name] is a 31 y.o. female, [id number], who presents for an annual exam. Seen recently for DUB 2nd Depo-Provera. CBC,TSH,Prolactin all normal.  ULTRASOUND today:  Uterus normal with endometrium at 6.4 mm Left ovary normal Right ovary: simple cyst 4.9 cm    History   Social History  . Marital Status: Single    Spouse Name: N/A    Number of Children: N/A  . Years of Education: N/A   Social History Main Topics  . Smoking status: Never Smoker   . Smokeless tobacco: Never Used  . Alcohol Use: No  . Drug Use: No  . Sexually Active: Yes    Birth Control/ Protection: None   Other Topics Concern  . None   Social History Narrative  . None    Menstrual cycle:   LMP: Patient's last menstrual period was 12/29/2011.           Cycle: amenorrhea  The following portions of the patient's history were reviewed and updated as appropriate: allergies, current medications, past family history, past medical history, past social history, past surgical history and problem list.  Review of Systems Pertinent items are noted in HPI. Breast:Negative for breast lump,nipple discharge or nipple retraction Gastrointestinal: Negative for abdominal pain, change in bowel habits or rectal bleeding Urinary:negative   Objective:    BP 110/76  Ht 5\' 4"  (1.626 m)  Wt 240 lb (108.863 kg)  BMI 41.20 kg/m2  LMP 12/29/2011  Breastfeeding? No    Weight:  Wt Readings from Last 1 Encounters:  01/28/12 240 lb (108.863  kg)          BMI: Body mass index is 41.20 kg/(m^2).  General Appearance: Alert, appropriate appearance for age. No acute distress HEENT: Grossly normal Neck / Thyroid: Supple, no masses, nodes or enlargement Lungs: clear to auscultation bilaterally Back: No CVA tenderness Breast Exam: No masses or nodes.No dimpling, nipple retraction or discharge. Cardiovascular: Regular rate and rhythm. S1, S2, no murmur Gastrointestinal: Soft, non-tender, no masses or organomegaly Pelvic Exam: Vulva and vagina appear normal. Bimanual exam reveals normal uterus and adnexa. limited by body habitus Rectovaginal: not indicated Lymphatic Exam: Non-palpable nodes in neck, clavicular, axillary, or inguinal regions Skin: no rash or abnormalities Neurologic: Normal gait and speech, no tremor  Psychiatric: Alert and oriented, appropriate affect.      Assessment:    Normal gyn exam Contraceptive management DUB resolved  Right ovarian cyst   Plan:    pap smear Return for Mirena insertion: R&B reviewed Ultrasound in 3 months STD screening: done Contraception:IUD      Albert Hersch AMD

## 2012-01-31 LAB — PAP IG, CT-NG, RFX HPV ASCU
Chlamydia Probe Amp: NEGATIVE
GC Probe Amp: NEGATIVE

## 2012-02-03 ENCOUNTER — Other Ambulatory Visit: Payer: Self-pay

## 2012-02-03 DIAGNOSIS — Z202 Contact with and (suspected) exposure to infections with a predominantly sexual mode of transmission: Secondary | ICD-10-CM

## 2012-02-05 ENCOUNTER — Ambulatory Visit (INDEPENDENT_AMBULATORY_CARE_PROVIDER_SITE_OTHER): Payer: Medicaid Other | Admitting: Obstetrics and Gynecology

## 2012-02-05 ENCOUNTER — Encounter: Payer: Self-pay | Admitting: Obstetrics and Gynecology

## 2012-02-05 VITALS — BP 118/78 | Wt 236.0 lb

## 2012-02-05 DIAGNOSIS — Z309 Encounter for contraceptive management, unspecified: Secondary | ICD-10-CM

## 2012-02-05 DIAGNOSIS — Z975 Presence of (intrauterine) contraceptive device: Secondary | ICD-10-CM

## 2012-02-05 DIAGNOSIS — IMO0001 Reserved for inherently not codable concepts without codable children: Secondary | ICD-10-CM

## 2012-02-05 DIAGNOSIS — Z3009 Encounter for other general counseling and advice on contraception: Secondary | ICD-10-CM

## 2012-02-05 MED ORDER — LEVONORGESTREL 20 MCG/24HR IU IUD: 1.0000 | INTRAUTERINE_SYSTEM | Freq: Once | INTRAUTERINE | Status: DC

## 2012-02-05 MED ORDER — LEVONORGESTREL 20 MCG/24HR IU IUD
INTRAUTERINE_SYSTEM | Freq: Once | INTRAUTERINE | Status: AC
  Administered 2012-02-05: 15:00:00 via INTRAUTERINE

## 2012-02-05 NOTE — Progress Notes (Signed)
IUD INSERTION NOTE   Consent signed after risks and benefits were reviewed including but not limited to bleeding, infection and risk of uterine perforation.  LMP: No LMP recorded. Patient is not currently having periods (Reason: Other). UPT: Negative GC / Chlamydia: negative   Prepping with Betadine Tenaculum placed on anterior lip of cervix Uterus sounded at  9 cm Insertion of MIRENA IUD per protocol without any complications  POST-PROCEDURE:  Patient instructed to call with fever or excessive pain Patient instructed to check IUD strings after each menstrual cycle   Follow-up: 2 weeks   Unnamed Zeien A MD 02/05/2012 3:07 PM

## 2012-02-12 ENCOUNTER — Telehealth: Payer: Self-pay | Admitting: Obstetrics and Gynecology

## 2012-02-12 NOTE — Telephone Encounter (Signed)
Laura/epic °

## 2012-02-13 ENCOUNTER — Other Ambulatory Visit: Payer: Self-pay

## 2012-02-13 ENCOUNTER — Ambulatory Visit (INDEPENDENT_AMBULATORY_CARE_PROVIDER_SITE_OTHER): Payer: Medicaid Other

## 2012-02-13 ENCOUNTER — Encounter: Payer: Self-pay | Admitting: Obstetrics and Gynecology

## 2012-02-13 ENCOUNTER — Ambulatory Visit (INDEPENDENT_AMBULATORY_CARE_PROVIDER_SITE_OTHER): Payer: Medicaid Other | Admitting: Obstetrics and Gynecology

## 2012-02-13 VITALS — BP 110/82 | Wt 232.0 lb

## 2012-02-13 DIAGNOSIS — N949 Unspecified condition associated with female genital organs and menstrual cycle: Secondary | ICD-10-CM

## 2012-02-13 DIAGNOSIS — Z30431 Encounter for routine checking of intrauterine contraceptive device: Secondary | ICD-10-CM

## 2012-02-13 DIAGNOSIS — R102 Pelvic and perineal pain: Secondary | ICD-10-CM

## 2012-02-13 MED ORDER — IBUPROFEN 600 MG PO TABS: 600.0000 mg | ORAL_TABLET | Freq: Four times a day (QID) | ORAL | Status: AC | PRN

## 2012-02-13 NOTE — Telephone Encounter (Signed)
Pt to come in for u/s at 11:00  ld

## 2012-02-13 NOTE — Progress Notes (Signed)
Contraception: IUD History of STD:  Trichomonas  History of ovarian cyst: yes:   History of fibroids: no History of endometriosis:no Previous ultrasound:yes:  02/13/2012  Urinary symptoms: none Gastro-intestinal symptoms:  Constipation: yes     Diarrhea: no     Nausea: no     Vomiting: no     Fever: no Vaginal discharge: no vaginal discharge  Subjective:  Pt stated having some lower left side pain and could not find strings IUD inserted 02/05/12  Objective:  Sono: IUD in proper position           Right ovarian cyst unchanged Cervix: strings + Uterus: Non-tender   Assessment:  Reassuring exam  Plan:  Ibuprofen 600 mg QID for 48 hours then PRN Keep appointment next week

## 2012-02-26 ENCOUNTER — Encounter: Payer: Self-pay | Admitting: Obstetrics and Gynecology

## 2012-02-26 ENCOUNTER — Ambulatory Visit (INDEPENDENT_AMBULATORY_CARE_PROVIDER_SITE_OTHER): Payer: Medicaid Other | Admitting: Obstetrics and Gynecology

## 2012-02-26 VITALS — BP 118/80 | Wt 231.0 lb

## 2012-02-26 DIAGNOSIS — Z309 Encounter for contraceptive management, unspecified: Secondary | ICD-10-CM

## 2012-02-26 MED ORDER — CIPROFLOXACIN HCL 500 MG PO TABS: 500.0000 mg | ORAL_TABLET | Freq: Two times a day (BID) | ORAL | Status: AC

## 2012-02-26 NOTE — Progress Notes (Signed)
Follow -up IUD today . Pt stated still having some stomach pain and still having irregular cycles . Pt stated no other complaints today .   IUD SURVEILLANCE NOTE  MIRENA IUD inserted on 02/05/12  Pain: mild Bleeding: minimal Patient has checked IUD strings: no Other complaints: pain with certain movements  EXAM:  IUD strings visualized: yes              Pelvic exam normal: yes  Next visit with annual exam or PRN Cipro for 3 days empirically   Retal Tonkinson A MD 02/26/2012 4:09 PM

## 2012-03-09 ENCOUNTER — Telehealth: Payer: Self-pay

## 2012-03-10 ENCOUNTER — Other Ambulatory Visit: Payer: Self-pay

## 2012-03-10 ENCOUNTER — Other Ambulatory Visit: Payer: Medicaid Other

## 2012-03-10 ENCOUNTER — Telehealth: Payer: Self-pay

## 2012-03-10 DIAGNOSIS — Z202 Contact with and (suspected) exposure to infections with a predominantly sexual mode of transmission: Secondary | ICD-10-CM

## 2012-03-10 DIAGNOSIS — Z113 Encounter for screening for infections with a predominantly sexual mode of transmission: Secondary | ICD-10-CM

## 2012-03-10 NOTE — Telephone Encounter (Signed)
appt made for labs  ld

## 2012-03-10 NOTE — Telephone Encounter (Signed)
Pt coming for labs not drawn at earlier appt.  ld

## 2012-03-11 LAB — RPR

## 2012-03-11 LAB — HSV 2 ANTIBODY, IGG: HSV 2 Glycoprotein G Ab, IgG: 5.78 IV — ABNORMAL HIGH

## 2012-03-11 NOTE — Progress Notes (Signed)
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 Please send info  ______

## 2012-03-12 ENCOUNTER — Telehealth: Payer: Self-pay

## 2012-03-12 NOTE — Telephone Encounter (Signed)
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 Please send info       

## 2012-03-12 NOTE — Telephone Encounter (Signed)
Message copied by Larwance Rote on Thu Mar 12, 2012 11:26 AM ------      Message from: [name]      Created: Wed Mar 11, 2012  5:59 PM       Please call patient to inform of Positive HSV 1 and 2. Please send info

## 2012-03-13 ENCOUNTER — Telehealth: Payer: Self-pay

## 2012-03-13 NOTE — Telephone Encounter (Signed)
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  PC to notify pt of result.  Phone # has a full voicemail, unable to leave message.  ld

## 2012-03-13 NOTE — Telephone Encounter (Signed)
Message copied by Larwance Rote on Fri Mar 13, 2012 11:41 AM ------      Message from: [name]      Created: Wed Mar 11, 2012  5:59 PM       Please call patient to inform of Positive HSV 1 and 2. Please send info

## 2012-03-31 ENCOUNTER — Telehealth: Payer: Self-pay | Admitting: Obstetrics and Gynecology

## 2012-03-31 NOTE — Telephone Encounter (Signed)
RC to pt.  Unable to LM due to mailbox not being set up.  ld

## 2012-03-31 NOTE — Telephone Encounter (Signed)
TRIAGE/EPIC °

## 2012-03-31 NOTE — Telephone Encounter (Signed)
Another attempt to call pt.  Unable to LM.  ld

## 2012-04-28 ENCOUNTER — Encounter: Payer: Medicaid Other | Admitting: Obstetrics and Gynecology

## 2012-05-14 ENCOUNTER — Ambulatory Visit (INDEPENDENT_AMBULATORY_CARE_PROVIDER_SITE_OTHER): Payer: Medicaid Other | Admitting: Obstetrics and Gynecology

## 2012-05-14 ENCOUNTER — Encounter: Payer: Self-pay | Admitting: Obstetrics and Gynecology

## 2012-05-14 VITALS — BP 120/72 | Temp 98.3°F | Wt 218.0 lb

## 2012-05-14 DIAGNOSIS — N898 Other specified noninflammatory disorders of vagina: Secondary | ICD-10-CM

## 2012-05-14 LAB — POCT WET PREP (WET MOUNT)

## 2012-05-14 NOTE — Progress Notes (Signed)
  Subjective:    [name] is a 31 y.o. female, [id number], who presents for Complaints of passing blood clots Vaginal discharge: clearthin mucoid Itching / Burning: yes Fever: no  Symptoms have been present for 1 month. Has used over-the-counter treatment: no Associated symptoms:  Pelvic pain: yes       Dyspareunia: yes     Odor:  yes.   The following portions of the patient's history were reviewed and updated as appropriate: allergies, current medications, past family history.  Review of Systems Pertinent items are noted in HPI. Gastrointestinal: Negative for abdominal pain, change in bowel habits or rectal bleeding Urinary:negative History of STD:  trichomonas STD screen: declined Objective:    BP 120/72  Temp 98.3 F (36.8 C) (Oral)  Wt 218 lb (98.884 kg)  Breastfeeding? No    Weight:  Wt Readings from Last 1 Encounters:  05/14/12 218 lb (98.884 kg)          BMI: There is no height on file to calculate BMI.  General Appearance: Alert, appropriate appearance for age. No acute distress GYN exam:VV normal except for increased D/C. Strings +. Uterus normal  OSOM BV: negative but pH 5.0   Assessment:    Normal gyn exam    Plan:    Reassurance RepHresh 2 x weekly for 4 weeks     [name] AMD

## 2012-09-23 ENCOUNTER — Telehealth: Payer: Self-pay | Admitting: Obstetrics and Gynecology

## 2012-09-23 NOTE — Telephone Encounter (Signed)
sr pt

## 2012-09-24 NOTE — Telephone Encounter (Signed)
Called pt states that she is having upper abdominal pain. No fever. No bleeding. Pt denies constipation. C/o fatigue. States that she had negative UPT. Pt states that she is going to PCP on Thursday and she will talk to them about issue. If they believe it is GYN related will call back for apt.  Darien Ramus, CMA

## 2013-03-26 ENCOUNTER — Emergency Department (INDEPENDENT_AMBULATORY_CARE_PROVIDER_SITE_OTHER)
Admission: EM | Admit: 2013-03-26 | Discharge: 2013-03-26 | Disposition: A | Discharge status: Home / Self Care | Payer: 59 | Source: Home / Self Care | Attending: Emergency Medicine | Admitting: Emergency Medicine

## 2013-03-26 ENCOUNTER — Encounter (HOSPITAL_COMMUNITY): Payer: Self-pay | Admitting: Emergency Medicine

## 2013-03-26 DIAGNOSIS — Z23 Encounter for immunization: Secondary | ICD-10-CM

## 2013-03-26 DIAGNOSIS — T23109A Burn of first degree of unspecified hand, unspecified site, initial encounter: Secondary | ICD-10-CM

## 2013-03-26 DIAGNOSIS — T23102A Burn of first degree of left hand, unspecified site, initial encounter: Secondary | ICD-10-CM

## 2013-03-26 MED ORDER — TETANUS-DIPHTH-ACELL PERTUSSIS 5-2.5-18.5 LF-MCG/0.5 IM SUSP
INTRAMUSCULAR | Status: AC
  Filled 2013-03-26: qty 0.5

## 2013-03-26 MED ORDER — TETANUS-DIPHTH-ACELL PERTUSSIS 5-2.5-18.5 LF-MCG/0.5 IM SUSP
0.5000 mL | Freq: Once | INTRAMUSCULAR | Status: AC
  Administered 2013-03-26: 0.5 mL via INTRAMUSCULAR

## 2013-03-26 MED ORDER — OXYCODONE-ACETAMINOPHEN 5-325 MG PO TABS: ORAL_TABLET | ORAL | Status: DC
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
## 2013-09-02 NOTE — L&D Delivery Note (Signed)
1243: To room as patient requesting to push.  Not feeling rectal pressure or urge to push, but is experiencing pain in abdomen.  Room prepped and patient placed in stirrups.  Dr. Carmon Sails called for provider support.  Patient delivered as below.   Delivery Note At 12:58 AM a viable female "[name]" was delivered via Vaginal, Spontaneous Delivery (Presentation: Right Occiput Posterior). Upon delivery of head, nuchal cord noted and infant delivered through via somersault maneuver.  Infant stimulated and bulb suctioned, then placed on mother's abdomen were stimulation was provided by nurse. Infant taken to warmer for skin tone and APGARs noted as 7, 9; weight 7 lb 14.8 oz (3595 g). Placenta delivered spontaneously and noted to be intact with 3VC upon inspection. Vaginal inspection revealed 1st degree perineal laceration that was hemostatic, but repaired. Fundus firm at umbilicus, bleeding scant, and infant skin to skin with mother upon provider exit.   Anesthesia: Epidural  Episiotomy: None Lacerations: 1st degree Suture Repair: 3.0 vicryl rapide Est. Blood Loss (mL):  400  Mom to postpartum.  Baby to Couplet care / Skin to Skin. Mother desires nexplanon for contraception Infant to have outpatient circumcision  [name] LYNN 04/23/2014, 3:02 AM

## 2013-09-09 ENCOUNTER — Encounter (HOSPITAL_COMMUNITY): Payer: Self-pay | Admitting: Emergency Medicine

## 2013-09-09 ENCOUNTER — Emergency Department (INDEPENDENT_AMBULATORY_CARE_PROVIDER_SITE_OTHER)
Admission: EM | Admit: 2013-09-09 | Discharge: 2013-09-09 | Disposition: A | Discharge status: Home / Self Care | Payer: 59 | Source: Home / Self Care | Attending: Family Medicine | Admitting: Family Medicine

## 2013-09-09 DIAGNOSIS — R110 Nausea: Secondary | ICD-10-CM

## 2013-09-09 DIAGNOSIS — Z3201 Encounter for pregnancy test, result positive: Secondary | ICD-10-CM

## 2013-09-09 DIAGNOSIS — R109 Unspecified abdominal pain: Secondary | ICD-10-CM

## 2013-09-09 DIAGNOSIS — Z331 Pregnant state, incidental: Secondary | ICD-10-CM

## 2013-09-09 LAB — POCT PREGNANCY, URINE: PREG TEST UR: POSITIVE — AB

## 2013-09-09 LAB — POCT URINALYSIS DIP (DEVICE)
BILIRUBIN URINE: NEGATIVE
GLUCOSE, UA: NEGATIVE mg/dL
Hgb urine dipstick: NEGATIVE
KETONES UR: NEGATIVE mg/dL
Leukocytes, UA: NEGATIVE
Nitrite: NEGATIVE
Protein, ur: NEGATIVE mg/dL
Specific Gravity, Urine: 1.025 (ref 1.005–1.030)
Urobilinogen, UA: 1 mg/dL (ref 0.0–1.0)
pH: 7 (ref 5.0–8.0)

## 2013-09-09 MED ORDER — ONDANSETRON 4 MG PO TBDP
ORAL_TABLET | ORAL | Status: AC
  Filled 2013-09-09: qty 2

## 2013-09-09 MED ORDER — ONDANSETRON HCL 4 MG PO TABS: 4.0000 mg | ORAL_TABLET | Freq: Four times a day (QID) | ORAL | Status: DC

## 2013-09-09 MED ORDER — ONDANSETRON 4 MG PO TBDP: 8.0000 mg | ORAL_TABLET | Freq: Once | ORAL | Status: DC

## 2013-09-09 NOTE — ED Provider Notes (Addendum)
CSN: [id number]     Arrival date & time 09/09/13  1547 History   None    Chief Complaint  Patient presents with  . Nausea   (Consider location/radiation/quality/duration/timing/severity/associated sxs/prior Treatment) Patient is a 33 y.o. female presenting with abdominal pain. The history is provided by the patient.  Abdominal Pain Pain location:  Epigastric Pain radiates to:  Does not radiate Pain severity:  Mild Duration:  4 days Progression:  Waxing and waning Chronicity:  New Relieved by:  None tried Associated symptoms: anorexia and nausea   Associated symptoms: no vaginal bleeding, no vaginal discharge and no vomiting   Associated symptoms comment:  Did home preg test today-pos, does not remember lmp. Is g2p2.   Past Medical History  Diagnosis Date  . Urinary tract infection   . Trichomonas 02/2011    treated  . Sinusitis   . Gestational diabetes   . Abnormal Pap smear   . H/O varicella   . GDM (gestational diabetes mellitus)   . Hyperemesis     During first pregnancy  . Hx of bacterial infection    Past Surgical History  Procedure Laterality Date  . No past surgeries     Family History  Problem Relation Age of Onset  . Diabetes Mother   . Hypertension Mother   . Peripheral vascular disease Mother   . Thyroid disease Mother   . Asthma Mother   . Diabetes Father   . Hypertension Father   . Anesthesia problems Neg Hx   . Hypotension Neg Hx   . Malignant hyperthermia Neg Hx   . Pseudochol deficiency Neg Hx   . Seizures Maternal Aunt   . Alcohol abuse Maternal Aunt   . Heart attack Maternal Uncle   . Kidney disease Maternal Uncle   . Cancer Maternal Uncle     prostate  . Thyroid disease Paternal Aunt   . Heart attack Paternal Uncle   . Kidney disease Paternal Uncle    History  Substance Use Topics  . Smoking status: Never Smoker   . Smokeless tobacco: Never Used  . Alcohol Use: No   OB History   Grav Para Term Preterm Abortions TAB SAB Ect Mult  Living   2 2 2  0 0 0 0 0 0 2     Review of Systems  Constitutional: Negative.   Gastrointestinal: Positive for nausea, abdominal pain and anorexia. Negative for vomiting.  Genitourinary: Negative for vaginal bleeding and vaginal discharge.    Allergies  Flagyl; Latex; and Other  Home Medications   Current Outpatient Rx  Name  Route  Sig  Dispense  Refill  . levonorgestrel (MIRENA) 20 MCG/24HR IUD   Intrauterine   1 each by Intrauterine route once.         . medroxyPROGESTERone (DEPO-PROVERA) 150 MG/ML injection   Intramuscular   Inject 1 mL (150 mg total) into the muscle once.   1 mL   3   . Multiple Vitamin (MULTIVITAMIN) tablet   Oral   Take 1 tablet by mouth daily.           . ondansetron (ZOFRAN) 4 MG tablet   Oral   Take 1 tablet (4 mg total) by mouth every 6 (six) hours. Prn n/v.   10 tablet   0   . oxyCODONE-acetaminophen (PERCOCET) 5-325 MG per tablet      1 to 2 tablets every 6 hours as needed for pain.   20 tablet   0   . pantoprazole (  PROTONIX) 40 MG tablet   Oral   Take 40 mg by mouth daily.          . vitamin C (ASCORBIC ACID) 250 MG tablet   Oral   Take 250 mg by mouth daily.          BP 136/77  Pulse 70  Temp(Src) 98.4 F (36.9 C) (Oral)  Resp 18  SpO2 98% Physical Exam  Nursing note and vitals reviewed. Constitutional: She is oriented to person, place, and time. She appears well-developed and well-nourished.  Abdominal: Soft. Normal appearance and bowel sounds are normal. She exhibits no distension and no mass. There is no hepatosplenomegaly. There is tenderness in the epigastric area. There is no rebound, no guarding and no CVA tenderness.  Neurological: She is alert and oriented to person, place, and time.  Skin: Skin is warm and dry.    ED Course  Procedures (including critical care time) Labs Review Labs Reviewed  POCT PREGNANCY, URINE - Abnormal; Notable for the following:    Preg Test, Ur POSITIVE (*)    All other  components within normal limits  POCT URINALYSIS DIP (DEVICE)   Imaging Review No results found.  EKG Interpretation    Date/Time:    Ventricular Rate:    PR Interval:    QRS Duration:   QT Interval:    QTC Calculation:   R Axis:     Text Interpretation:              MDM  U/a neg, upreg pos.    Billy Fischer, MD 09/09/13 [place], MD 09/10/13 [phone]

## 2013-09-09 NOTE — Discharge Instructions (Signed)
See your doctor for prenatal care, [facility] if pain gets worse or bleeding develops.

## 2013-09-09 NOTE — ED Notes (Signed)
No zofran in pyxis, patient not waiting.

## 2013-09-09 NOTE — ED Notes (Signed)
Assessed by dr Juventino Slovak prior to this nurse

## 2013-09-16 ENCOUNTER — Inpatient Hospital Stay (HOSPITAL_COMMUNITY): Payer: 59

## 2013-09-16 ENCOUNTER — Inpatient Hospital Stay (HOSPITAL_COMMUNITY)
Admission: AD | Admit: 2013-09-16 | Discharge: 2013-09-16 | Disposition: A | Discharge status: Home / Self Care | Payer: 59 | Source: Ambulatory Visit | Attending: Obstetrics and Gynecology | Admitting: Obstetrics and Gynecology

## 2013-09-16 ENCOUNTER — Encounter (HOSPITAL_COMMUNITY): Payer: Self-pay | Admitting: *Deleted

## 2013-09-16 DIAGNOSIS — K117 Disturbances of salivary secretion: Secondary | ICD-10-CM | POA: Diagnosis not present

## 2013-09-16 DIAGNOSIS — O210 Mild hyperemesis gravidarum: Secondary | ICD-10-CM | POA: Insufficient documentation

## 2013-09-16 DIAGNOSIS — O9989 Other specified diseases and conditions complicating pregnancy, childbirth and the puerperium: Secondary | ICD-10-CM | POA: Diagnosis not present

## 2013-09-16 LAB — CBC
HCT: 34.9 % — ABNORMAL LOW (ref 36.0–46.0)
Hemoglobin: 12.2 g/dL (ref 12.0–15.0)
MCH: 30.2 pg (ref 26.0–34.0)
MCHC: 35 g/dL (ref 30.0–36.0)
MCV: 86.4 fL (ref 78.0–100.0)
PLATELETS: 179 10*3/uL (ref 150–400)
RBC: 4.04 MIL/uL (ref 3.87–5.11)
RDW: 11.7 % (ref 11.5–15.5)
WBC: 6.9 10*3/uL (ref 4.0–10.5)

## 2013-09-16 LAB — COMPREHENSIVE METABOLIC PANEL
ALT: 10 U/L (ref 0–35)
AST: 14 U/L (ref 0–37)
Albumin: 4 g/dL (ref 3.5–5.2)
Alkaline Phosphatase: 46 U/L (ref 39–117)
BUN: 14 mg/dL (ref 6–23)
CALCIUM: 9.7 mg/dL (ref 8.4–10.5)
CHLORIDE: 100 meq/L (ref 96–112)
CO2: 23 mEq/L (ref 19–32)
Creatinine, Ser: 0.56 mg/dL (ref 0.50–1.10)
GFR calc Af Amer: >90 mL/min (ref >90)
GFR calc non Af Amer: >90 mL/min (ref >90)
Glucose, Bld: 95 mg/dL (ref 70–99)
Potassium: 4.1 mEq/L (ref 3.7–5.3)
Sodium: 134 mEq/L — ABNORMAL LOW (ref 137–147)
Total Protein: 7.5 g/dL (ref 6.0–8.3)

## 2013-09-16 LAB — URINALYSIS, ROUTINE W REFLEX MICROSCOPIC
Bilirubin Urine: NEGATIVE
Glucose, UA: NEGATIVE mg/dL
Hgb urine dipstick: NEGATIVE
Ketones, ur: 15 mg/dL — AB
Leukocytes, UA: NEGATIVE
NITRITE: NEGATIVE
Protein, ur: NEGATIVE mg/dL
SPECIFIC GRAVITY, URINE: 1.025 (ref 1.005–1.030)
UROBILINOGEN UA: 0.2 mg/dL (ref 0.0–1.0)
pH: 6 (ref 5.0–8.0)

## 2013-09-16 LAB — LIPASE, BLOOD: LIPASE: 49 U/L (ref 11–59)

## 2013-09-16 LAB — AMYLASE: Amylase: 62 U/L (ref 0–105)

## 2013-09-16 LAB — TSH: TSH: 1.3 u[IU]/mL (ref 0.350–4.500)

## 2013-09-16 MED ORDER — PROMETHAZINE HCL 25 MG/ML IJ SOLN
25.0000 mg | Freq: Once | INTRAVENOUS | Status: AC
  Administered 2013-09-16: 25 mg via INTRAVENOUS
  Filled 2013-09-16: qty 1

## 2013-09-16 MED ORDER — GLYCOPYRROLATE 1 MG PO TABS: 1.0000 mg | ORAL_TABLET | Freq: Three times a day (TID) | ORAL | Status: DC | PRN

## 2013-09-16 MED ORDER — GLYCOPYRROLATE 1 MG PO TABS
1.0000 mg | ORAL_TABLET | Freq: Once | ORAL | Status: AC
  Administered 2013-09-16: 1 mg via ORAL
  Filled 2013-09-16: qty 1

## 2013-09-16 MED ORDER — ONDANSETRON 8 MG PO TBDP
8.0000 mg | ORAL_TABLET | Freq: Once | ORAL | Status: AC
  Administered 2013-09-16: 8 mg via ORAL
  Filled 2013-09-16: qty 1

## 2013-09-16 MED ORDER — ONDANSETRON 8 MG PO TBDP: 8.0000 mg | ORAL_TABLET | Freq: Three times a day (TID) | ORAL | Status: DC | PRN

## 2013-09-16 MED ORDER — PROCHLORPERAZINE EDISYLATE 5 MG/ML IJ SOLN
10.0000 mg | Freq: Once | INTRAMUSCULAR | Status: AC
  Administered 2013-09-16: 10 mg via INTRAVENOUS
  Filled 2013-09-16: qty 2

## 2013-09-16 MED ORDER — PROCHLORPERAZINE EDISYLATE 5 MG/ML IJ SOLN: 10.0000 mg | Freq: Four times a day (QID) | INTRAMUSCULAR | Status: DC | PRN

## 2013-09-16 MED ORDER — BUTALBITAL-APAP-CAFFEINE 50-325-40 MG PO TABS
2.0000 | ORAL_TABLET | Freq: Once | ORAL | Status: AC
  Administered 2013-09-16: 2 via ORAL
  Filled 2013-09-16: qty 2

## 2013-09-16 MED ORDER — LACTATED RINGERS IV BOLUS (SEPSIS)
1000.0000 mL | Freq: Once | INTRAVENOUS | Status: AC
  Administered 2013-09-16: 1000 mL via INTRAVENOUS

## 2013-09-16 MED ORDER — PANTOPRAZOLE SODIUM 40 MG IV SOLR
40.0000 mg | Freq: Once | INTRAVENOUS | Status: AC
  Administered 2013-09-16: 40 mg via INTRAVENOUS
  Filled 2013-09-16: qty 40

## 2013-09-16 MED ORDER — PROMETHAZINE HCL 50 MG PO TABS: 25.0000 mg | ORAL_TABLET | Freq: Four times a day (QID) | ORAL | Status: DC | PRN

## 2013-09-16 NOTE — Discharge Instructions (Signed)
Hyperemesis Gravidarum  Hyperemesis gravidarum is a severe form of nausea and vomiting that happens during pregnancy. Hyperemesis is worse than morning sickness. It may cause you to have nausea or vomiting all day for many days. It may keep you from eating and drinking enough food and liquids. Hyperemesis usually occurs during the first half (the first 20 weeks) of pregnancy. It often goes away once a woman is in her second half of pregnancy. However, sometimes hyperemesis continues through an entire pregnancy.   CAUSES   The cause of this condition is not completely known but is thought to be related to changes in the body's hormones when pregnant. It could be from the high level of the pregnancy hormone or an increase in estrogen in the body.   SIGNS AND SYMPTOMS   · Severe nausea and vomiting.  · Nausea that does not go away.  · Vomiting that does not allow you to keep any food down.  · Weight loss and body fluid loss (dehydration).  · Having no desire to eat or not liking food you have previously enjoyed.  DIAGNOSIS   Your health care provider will do a physical exam and ask you about your symptoms. He or she may also order blood tests and urine tests to make sure something else is not causing the problem.   TREATMENT   You may only need medicine to control the problem. If medicines do not control the nausea and vomiting, you will be treated in the hospital to prevent dehydration, increased acid in the blood (acidosis), weight loss, and changes in the electrolytes in your body that may harm the unborn baby (fetus). You may need IV fluids.   HOME CARE INSTRUCTIONS   · Only take over-the-counter or prescription medicines as directed by your health care provider.  · Try eating a couple of dry crackers or toast in the morning before getting out of bed.  · Avoid foods and smells that upset your stomach.  · Avoid fatty and spicy foods.  · Eat 5 6 small meals a day.  · Do not drink when eating meals. Drink between  meals.  · For snacks, eat high-protein foods, such as cheese.  · Eat or suck on things that have ginger in them. Ginger helps nausea.  · Avoid food preparation. The smell of food can spoil your appetite.  · Avoid iron pills and iron in your multivitamins until after 3 4 months of being pregnant. However, consult with your health care provider before stopping any prescribed iron pills.  SEEK MEDICAL CARE IF:   · Your abdominal pain increases.  · You have a severe headache.  · You have vision problems.  · You are losing weight.  SEEK IMMEDIATE MEDICAL CARE IF:   · You are unable to keep fluids down.  · You vomit blood.  · You have constant nausea and vomiting.  · You have excessive weakness.  · You have extreme thirst.  · You have dizziness or fainting.  · You have a fever or persistent symptoms for more than 2 3 days.  · You have a fever and your symptoms suddenly get worse.  MAKE SURE YOU:   · Understand these instructions.  · Will watch your condition.  · Will get help right away if you are not doing well or get worse.  Document Released: 08/19/2005 Document Revised: 06/09/2013 Document Reviewed: 03/31/2013  [employer]® Patient Information ©2014 [employer].

## 2013-09-16 NOTE — MAU Note (Addendum)
Started vomiting last wk, went to Urgent Care, was given something for nausea.  Ptyalism. Headache since last week and sharp pain/pressure in lower abd.

## 2013-09-17 NOTE — MAU Provider Note (Signed)
History     CSN: [id number]  Arrival date and time: 09/16/13 1538   None     Chief Complaint  Patient presents with  . Emesis During Pregnancy   HPI Comments: Pt is a G3P2 at 6wks confirmed by [place] today, w report N/V worsening in last couple of days, "nothing seems to help" states had w last pregnancy. Denies any VB or pain.  Had rcv'd IVF's and antiemetics and was tol some PO when I saw her in MAU.       Past Medical History  Diagnosis Date  . Urinary tract infection   . Trichomonas 02/2011    treated  . Sinusitis   . Gestational diabetes   . Abnormal Pap smear   . H/O varicella   . GDM (gestational diabetes mellitus)   . Hyperemesis     During first pregnancy  . Hx of bacterial infection     Past Surgical History  Procedure Laterality Date  . No past surgeries      Family History  Problem Relation Age of Onset  . Diabetes Mother   . Hypertension Mother   . Peripheral vascular disease Mother   . Thyroid disease Mother   . Asthma Mother   . Diabetes Father   . Hypertension Father   . Anesthesia problems Neg Hx   . Hypotension Neg Hx   . Malignant hyperthermia Neg Hx   . Pseudochol deficiency Neg Hx   . Seizures Maternal Aunt   . Alcohol abuse Maternal Aunt   . Heart attack Maternal Uncle   . Kidney disease Maternal Uncle   . Cancer Maternal Uncle     prostate  . Thyroid disease Paternal Aunt   . Heart attack Paternal Uncle   . Kidney disease Paternal Uncle     History  Substance Use Topics  . Smoking status: Never Smoker   . Smokeless tobacco: Never Used  . Alcohol Use: No    Allergies:  Allergies  Allergen Reactions  . Flagyl [Metronidazole Hcl] Swelling  . Latex Hives  . Other Other (See Comments)    Pt has a reaction to plastic tape. She breaks out with a rash and her skin peels.    No prescriptions prior to admission    Review of Systems  Gastrointestinal: Positive for nausea and vomiting.  Neurological: Positive for headaches.   All other systems reviewed and are negative.   Physical Exam   Blood pressure 117/63, pulse 70, temperature 98.2 F (36.8 C), temperature source Oral, resp. rate 18, height 5\' 3"  (1.6 m), weight 211 lb (95.709 kg).  Physical Exam  Nursing note and vitals reviewed. Constitutional: She is oriented to person, place, and time. She appears well-developed and well-nourished.  HENT:  Head: Normocephalic.  Eyes: Pupils are equal, round, and reactive to light.  Neck: Normal range of motion.  Cardiovascular: Normal rate, regular rhythm and normal heart sounds.   Respiratory: Effort normal and breath sounds normal.  GI: Soft. Bowel sounds are normal.  Genitourinary: Vagina normal.  Musculoskeletal: Normal range of motion.  Neurological: She is alert and oriented to person, place, and time. She has normal reflexes.  Skin: Skin is warm and dry.  Psychiatric: She has a normal mood and affect. Her behavior is normal.    MAU Course  Procedures  CBC CMP Amylase/lipase IVF's Zofran, phenergan, compazine, fiorcet  [place]  Assessment and Plan  IUP at 6wks, G3P2 w prior hx hyperemesis  Hyperemesis  Ptyalism  tol PO challenge  in MAU  dc'd home, rv'd diet recommendations rx zofran 8mg  ODT, phenergan 25mg  PO,  F/u office as scheduled    Harlen Danford M 09/17/2013, 7:33 AM

## 2013-09-20 LAB — OB RESULTS CONSOLE RUBELLA ANTIBODY, IGM: RUBELLA: IMMUNE

## 2013-09-20 LAB — OB RESULTS CONSOLE GC/CHLAMYDIA
CHLAMYDIA, DNA PROBE: NEGATIVE
GC PROBE AMP, GENITAL: NEGATIVE

## 2013-09-20 LAB — OB RESULTS CONSOLE RPR: RPR: NONREACTIVE

## 2013-09-20 LAB — OB RESULTS CONSOLE HIV ANTIBODY (ROUTINE TESTING): HIV: NONREACTIVE

## 2013-09-20 LAB — OB RESULTS CONSOLE ANTIBODY SCREEN: Antibody Screen: NEGATIVE

## 2013-09-20 LAB — OB RESULTS CONSOLE ABO/RH: RH Type: POSITIVE

## 2013-09-20 LAB — OB RESULTS CONSOLE HEPATITIS B SURFACE ANTIGEN: HEP B S AG: NEGATIVE

## 2013-09-27 ENCOUNTER — Inpatient Hospital Stay (HOSPITAL_COMMUNITY)
Admission: AD | Admit: 2013-09-27 | Discharge: 2013-09-27 | Disposition: A | Discharge status: Home / Self Care | Payer: 59 | Source: Ambulatory Visit | Attending: Obstetrics and Gynecology | Admitting: Obstetrics and Gynecology

## 2013-09-27 ENCOUNTER — Encounter (HOSPITAL_COMMUNITY): Payer: Self-pay | Admitting: *Deleted

## 2013-09-27 DIAGNOSIS — K5900 Constipation, unspecified: Secondary | ICD-10-CM | POA: Diagnosis present

## 2013-09-27 DIAGNOSIS — O210 Mild hyperemesis gravidarum: Secondary | ICD-10-CM | POA: Diagnosis not present

## 2013-09-27 DIAGNOSIS — O9989 Other specified diseases and conditions complicating pregnancy, childbirth and the puerperium: Principal | ICD-10-CM

## 2013-09-27 DIAGNOSIS — K117 Disturbances of salivary secretion: Secondary | ICD-10-CM | POA: Diagnosis present

## 2013-09-27 DIAGNOSIS — O99891 Other specified diseases and conditions complicating pregnancy: Secondary | ICD-10-CM | POA: Diagnosis not present

## 2013-09-27 DIAGNOSIS — O219 Vomiting of pregnancy, unspecified: Secondary | ICD-10-CM | POA: Diagnosis present

## 2013-09-27 LAB — URINALYSIS, ROUTINE W REFLEX MICROSCOPIC
BILIRUBIN URINE: NEGATIVE
Glucose, UA: NEGATIVE mg/dL
Hgb urine dipstick: NEGATIVE
Ketones, ur: NEGATIVE mg/dL
LEUKOCYTES UA: NEGATIVE
NITRITE: NEGATIVE
Protein, ur: NEGATIVE mg/dL
SPECIFIC GRAVITY, URINE: 1.02 (ref 1.005–1.030)
UROBILINOGEN UA: 0.2 mg/dL (ref 0.0–1.0)
pH: 7.5 (ref 5.0–8.0)

## 2013-09-27 MED ORDER — ACETAMINOPHEN 325 MG PO TABS: 650.0000 mg | ORAL_TABLET | Freq: Once | ORAL | Status: DC

## 2013-09-27 MED ORDER — ACETAMINOPHEN 500 MG PO TABS
1000.0000 mg | ORAL_TABLET | Freq: Once | ORAL | Status: AC
  Administered 2013-09-27: 1000 mg via ORAL
  Filled 2013-09-27: qty 2

## 2013-09-27 MED ORDER — PANTOPRAZOLE SODIUM 40 MG PO TBEC
40.0000 mg | DELAYED_RELEASE_TABLET | Freq: Once | ORAL | Status: AC
  Administered 2013-09-27: 40 mg via ORAL
  Filled 2013-09-27: qty 1

## 2013-09-27 MED ORDER — PANTOPRAZOLE SODIUM 40 MG PO TBEC: 40.0000 mg | DELAYED_RELEASE_TABLET | Freq: Every day | ORAL | Status: DC

## 2013-09-27 NOTE — MAU Note (Signed)
Patient states she has had nausea and vomiting for a while. Not able to keep anything down. Has pain in throat with vomiting and a headache. Denies bleeding or discharge. Denies S/S of the flu. Now feels constipated with abdominal pain.

## 2013-09-27 NOTE — MAU Provider Note (Addendum)
[name] is a 33 y.o. female presenting for severe constipation at 7+4 weeks. Seen 09/16/13 for hyperemesis with all labs normal including amylase, lipase and TSH. Is using Zofran which is somewhat helpful with keeping fluids down but has not had a BM in 12 days.  Has had a very successful BM with enema. Now c/o H/A and is hungry.    History OB History   Grav Para Term Preterm Abortions TAB SAB Ect Mult Living   3 2 2  0 0 0 0 0 0 2     Past Medical History  Diagnosis Date  . Urinary tract infection   . Trichomonas 02/2011    treated  . Sinusitis   . Gestational diabetes   . Abnormal Pap smear   . H/O varicella   . GDM (gestational diabetes mellitus)   . Hyperemesis     During first pregnancy  . Hx of bacterial infection    Past Surgical History  Procedure Laterality Date  . No past surgeries        Blood pressure 93/47, pulse 65, temperature 98.7 F (37.1 C), temperature source Oral, resp. rate 20, height 5\' 2"  (1.575 m), weight 213 lb 3.2 oz (96.707 kg), SpO2 100.00%.  General Appearance: Alert, appropriate appearance for age. No acute distress HEENT Exam: Grossly normal Gastrointestinal Exam: soft, non-tender Psychiatric Exam: Alert and oriented, appropriate affect  +++++++++++++++++++++++++++++++++++++++++++++++++++++++++++++++  Assessment and plan:  7+4 weeks with constipation now improved after enema Will order bland diet and tylenol Plan d/c after eating Pt instructed to use Colace 2 capsules am and HS as well as Phenergan at bedtime Pt to try BOOST as a supplement and to d/c vitamins for now  Next appointment in office: end of February for NOB  Delsa Bern MD  Addendum: Tolerated bland diet--no vomiting, still has ptyalism. On Zofran, Phenergan, and Robinul. Reviewed Dr. Boyd Kerbs previous instructions. Patient will come by the office tomorrow for samples of Diclgeis for trial. Keep scheduled NOB interview appt 10/06/13. Support to patient for  struggles with N/V, ptyalism, and constipation.  Donnel Saxon, CNM 09/27/13 8p

## 2013-09-27 NOTE — Discharge Instructions (Signed)
Hyperemesis Gravidarum Hyperemesis gravidarum is a severe form of nausea and vomiting that happens during pregnancy. Hyperemesis is worse than morning sickness. It may cause you to have nausea or vomiting all day for many days. It may keep you from eating and drinking enough food and liquids. Hyperemesis usually occurs during the first half (the first 20 weeks) of pregnancy. It often goes away once a woman is in her second half of pregnancy. However, sometimes hyperemesis continues through an entire pregnancy.  CAUSES  The cause of this condition is not completely known but is thought to be related to changes in the body's hormones when pregnant. It could be from the high level of the pregnancy hormone or an increase in estrogen in the body.  SIGNS AND SYMPTOMS   Severe nausea and vomiting.  Nausea that does not go away.  Vomiting that does not allow you to keep any food down.  Weight loss and body fluid loss (dehydration).  Having no desire to eat or not liking food you have previously enjoyed. DIAGNOSIS  Your health care provider will do a physical exam and ask you about your symptoms. He or she may also order blood tests and urine tests to make sure something else is not causing the problem.  TREATMENT  You may only need medicine to control the problem. If medicines do not control the nausea and vomiting, you will be treated in the hospital to prevent dehydration, increased acid in the blood (acidosis), weight loss, and changes in the electrolytes in your body that may harm the unborn baby (fetus). You may need IV fluids.  HOME CARE INSTRUCTIONS   Only take over-the-counter or prescription medicines as directed by your health care provider.  Try eating a couple of dry crackers or toast in the morning before getting out of bed.  Avoid foods and smells that upset your stomach.  Avoid fatty and spicy foods.  Eat 5 6 small meals a day.  Do not drink when eating meals. Drink between  meals.  For snacks, eat high-protein foods, such as cheese.  Eat or suck on things that have ginger in them. Ginger helps nausea.  Avoid food preparation. The smell of food can spoil your appetite.  Avoid iron pills and iron in your multivitamins until after 3 4 months of being pregnant. However, consult with your health care provider before stopping any prescribed iron pills. SEEK MEDICAL CARE IF:   Your abdominal pain increases.  You have a severe headache.  You have vision problems.  You are losing weight. SEEK IMMEDIATE MEDICAL CARE IF:   You are unable to keep fluids down.  You vomit blood.  You have constant nausea and vomiting.  You have excessive weakness.  You have extreme thirst.  You have dizziness or fainting.  You have a fever or persistent symptoms for more than 2 3 days.  You have a fever and your symptoms suddenly get worse. MAKE SURE YOU:   Understand these instructions.  Will watch your condition.  Will get help right away if you are not doing well or get worse. Document Released: 08/19/2005 Document Revised: 06/09/2013 Document Reviewed: 03/31/2013 [facility] Patient Information 2014 [place]. Constipation, Adult Constipation is when a person has fewer than 3 bowel movements a week; has difficulty having a bowel movement; or has stools that are dry, hard, or larger than normal. As people grow older, constipation is more common. If you try to fix constipation with medicines that make you have a  bowel movement (laxatives), the problem may get worse. Long-term laxative use may cause the muscles of the colon to become weak. A low-fiber diet, not taking in enough fluids, and taking certain medicines may make constipation worse. CAUSES   Certain medicines, such as antidepressants, pain medicine, iron supplements, antacids, and water pills.   Certain diseases, such as diabetes, irritable bowel syndrome (IBS), thyroid disease, or depression.   Not  drinking enough water.   Not eating enough fiber-rich foods.   Stress or travel.  Lack of physical activity or exercise.  Not going to the restroom when there is the urge to have a bowel movement.  Ignoring the urge to have a bowel movement.  Using laxatives too much. SYMPTOMS   Having fewer than 3 bowel movements a week.   Straining to have a bowel movement.   Having hard, dry, or larger than normal stools.   Feeling full or bloated.   Pain in the lower abdomen.  Not feeling relief after having a bowel movement. DIAGNOSIS  Your caregiver will take a medical history and perform a physical exam. Further testing may be done for severe constipation. Some tests may include:   A barium enema X-ray to examine your rectum, colon, and sometimes, your small intestine.  A sigmoidoscopy to examine your lower colon.  A colonoscopy to examine your entire colon. TREATMENT  Treatment will depend on the severity of your constipation and what is causing it. Some dietary treatments include drinking more fluids and eating more fiber-rich foods. Lifestyle treatments may include regular exercise. If these diet and lifestyle recommendations do not help, your caregiver may recommend taking over-the-counter laxative medicines to help you have bowel movements. Prescription medicines may be prescribed if over-the-counter medicines do not work.  HOME CARE INSTRUCTIONS   Increase dietary fiber in your diet, such as fruits, vegetables, whole grains, and beans. Limit high-fat and processed sugars in your diet, such as [place] fries, hamburgers, cookies, candies, and soda.   A fiber supplement may be added to your diet if you cannot get enough fiber from foods.   Drink enough fluids to keep your urine clear or pale yellow.   Exercise regularly or as directed by your caregiver.   Go to the restroom when you have the urge to go. Do not hold it.  Only take medicines as directed by your  caregiver. Do not take other medicines for constipation without talking to your caregiver first. [place] IF:   You have bright red blood in your stool.   Your constipation lasts for more than 4 days or gets worse.   You have abdominal or rectal pain.   You have thin, pencil-like stools.  You have unexplained weight loss. MAKE SURE YOU:   Understand these instructions.  Will watch your condition.  Will get help right away if you are not doing well or get worse. Document Released: 05/17/2004 Document Revised: 11/11/2011 Document Reviewed: 05/31/2013 [facility] Patient Information 2014 [place], [place].

## 2013-10-09 ENCOUNTER — Encounter (HOSPITAL_COMMUNITY): Payer: Self-pay | Admitting: Obstetrics and Gynecology

## 2013-10-09 ENCOUNTER — Inpatient Hospital Stay (HOSPITAL_COMMUNITY)
Admission: AD | Admit: 2013-10-09 | Discharge: 2013-10-09 | Disposition: A | Discharge status: Home / Self Care | Payer: 59 | Source: Ambulatory Visit | Attending: Obstetrics and Gynecology | Admitting: Obstetrics and Gynecology

## 2013-10-09 DIAGNOSIS — O210 Mild hyperemesis gravidarum: Secondary | ICD-10-CM | POA: Insufficient documentation

## 2013-10-09 DIAGNOSIS — R51 Headache: Secondary | ICD-10-CM | POA: Insufficient documentation

## 2013-10-09 DIAGNOSIS — K5900 Constipation, unspecified: Secondary | ICD-10-CM | POA: Diagnosis not present

## 2013-10-09 LAB — URINE MICROSCOPIC-ADD ON

## 2013-10-09 LAB — URINALYSIS, ROUTINE W REFLEX MICROSCOPIC
Bilirubin Urine: NEGATIVE
Glucose, UA: NEGATIVE mg/dL
Hgb urine dipstick: NEGATIVE
Ketones, ur: NEGATIVE mg/dL
LEUKOCYTES UA: NEGATIVE
NITRITE: NEGATIVE
PROTEIN: 30 mg/dL — AB
Specific Gravity, Urine: >1.03 — ABNORMAL HIGH (ref 1.005–1.030)
Urobilinogen, UA: 0.2 mg/dL (ref 0.0–1.0)
pH: 6 (ref 5.0–8.0)

## 2013-10-09 MED ORDER — ACETAMINOPHEN 325 MG PO TABS
650.0000 mg | ORAL_TABLET | Freq: Once | ORAL | Status: AC
  Administered 2013-10-09: 650 mg via ORAL
  Filled 2013-10-09: qty 2

## 2013-10-09 MED ORDER — AMPICILLIN 500 MG PO CAPS: 500.0000 mg | ORAL_CAPSULE | Freq: Three times a day (TID) | ORAL | Status: DC

## 2013-10-09 MED ORDER — METOCLOPRAMIDE HCL 10 MG PO TABS: 10.0000 mg | ORAL_TABLET | Freq: Three times a day (TID) | ORAL | Status: DC

## 2013-10-09 MED ORDER — BUTALBITAL-APAP-CAFFEINE 50-325-40 MG PO TABS
1.0000 | ORAL_TABLET | Freq: Once | ORAL | Status: AC
  Administered 2013-10-09: 1 via ORAL
  Filled 2013-10-09: qty 1

## 2013-10-09 MED ORDER — PROMETHAZINE HCL 25 MG/ML IJ SOLN
12.5000 mg | Freq: Once | INTRAMUSCULAR | Status: AC
  Administered 2013-10-09: 12.5 mg via INTRAVENOUS
  Filled 2013-10-09: qty 1

## 2013-10-09 MED ORDER — LACTATED RINGERS IV SOLN: INTRAVENOUS | Status: DC

## 2013-10-09 MED ORDER — ONDANSETRON HCL 4 MG/2ML IJ SOLN
4.0000 mg | Freq: Once | INTRAMUSCULAR | Status: AC
  Administered 2013-10-09: 4 mg via INTRAVENOUS
  Filled 2013-10-09: qty 2

## 2013-10-09 MED ORDER — LACTATED RINGERS IV BOLUS (SEPSIS)
500.0000 mL | Freq: Once | INTRAVENOUS | Status: AC
  Administered 2013-10-09: 500 mL via INTRAVENOUS

## 2013-10-09 MED ORDER — BUTALBITAL-APAP-CAFFEINE 50-325-40 MG PO TABS: 1.0000 | ORAL_TABLET | Freq: Four times a day (QID) | ORAL | Status: DC | PRN

## 2013-10-09 NOTE — Discharge Instructions (Signed)
Check at the pharmacy for relief bands-accupressure wrist bands with TENS electrical stimulation.

## 2013-10-09 NOTE — MAU Note (Signed)
Patient complains of headache, nausea and vomiting. Been taking Diclegis with no relief. No BM since Sunday.

## 2013-10-09 NOTE — MAU Provider Note (Signed)
History   33 yo, G3P2002 at [redacted]w[redacted]d presents again to MAU with c/o N/V and HA.  Previously pt has been rx'd Zofran with little relief and constipation, Phenergan, and Diclegis which she says she can't take because it make her too sleepy.  Pt has also been experiencing ptyalism and is taking Robinul.  Denies VB, cramping, recent fever, resp c/o's, UTI s/s.   Asymptomatic bacturia noted at [facility] interview - rx sent to pharmacy on 1/27 but has not gone to pick it up yet.  No chief complaint on file.  HPI  OB History   Grav Para Term Preterm Abortions TAB SAB Ect Mult Living   3 2 2  0 0 0 0 0 0 2      Past Medical History  Diagnosis Date  . Urinary tract infection   . Trichomonas 02/2011    treated  . Sinusitis   . Gestational diabetes   . Abnormal Pap smear   . H/O varicella   . GDM (gestational diabetes mellitus)   . Hyperemesis     During first pregnancy  . Hx of bacterial infection     Past Surgical History  Procedure Laterality Date  . No past surgeries      Family History  Problem Relation Age of Onset  . Diabetes Mother   . Hypertension Mother   . Peripheral vascular disease Mother   . Thyroid disease Mother   . Asthma Mother   . Diabetes Father   . Hypertension Father   . Anesthesia problems Neg Hx   . Hypotension Neg Hx   . Malignant hyperthermia Neg Hx   . Pseudochol deficiency Neg Hx   . Seizures Maternal Aunt   . Alcohol abuse Maternal Aunt   . Heart attack Maternal Uncle   . Kidney disease Maternal Uncle   . Cancer Maternal Uncle     prostate  . Thyroid disease Paternal Aunt   . Heart attack Paternal Uncle   . Kidney disease Paternal Uncle     History  Substance Use Topics  . Smoking status: Never Smoker   . Smokeless tobacco: Never Used  . Alcohol Use: No    Allergies:  Allergies  Allergen Reactions  . Flagyl [Metronidazole Hcl] Swelling  . Latex Hives  . Other Other (See Comments)    Pt has a reaction to plastic tape. She breaks out with a  rash and her skin peels.    Prescriptions prior to admission  Medication Sig Dispense Refill  . Doxylamine-Pyridoxine (DICLEGIS) 10-10 MG TBEC Take by mouth.      Marland Kitchen glycopyrrolate (ROBINUL) 1 MG tablet Take 1 tablet (1 mg total) by mouth every 8 (eight) hours as needed.  45 tablet  1  . ondansetron (ZOFRAN ODT) 8 MG disintegrating tablet Take 1 tablet (8 mg total) by mouth every 8 (eight) hours as needed for nausea or vomiting.  20 tablet  2  . Prenatal Vit-Fe Fumarate-FA (PRENATAL MULTIVITAMIN) TABS tablet Take 1 tablet by mouth at bedtime.      Marland Kitchen albuterol (PROVENTIL HFA;VENTOLIN HFA) 108 (90 BASE) MCG/ACT inhaler Inhale into the lungs every 6 (six) hours as needed for wheezing or shortness of breath.      . pantoprazole (PROTONIX) 40 MG tablet Take 1 tablet (40 mg total) by mouth daily.  30 tablet  4  . promethazine (PHENERGAN) 50 MG tablet Take 0.5 tablets (25 mg total) by mouth every 6 (six) hours as needed for nausea or vomiting.  30 tablet  2    ROS ROS: see HPI above, all other systems are negative  Physical Exam   Blood pressure 112/88, pulse 69, temperature 98.2 F (36.8 C), temperature source Oral, resp. rate 22.  Physical Exam Chest: Clear Heart: RRR Abdomen: gravid, NT Extremities: WNL   ED Course  IUP at [redacted]w[redacted]d Hyperemesis  LR bolus and infusion Zofran IV 4 mg Once Tylenol for HA if Zofran alleviates nausea  Report given to [place], [place], MSN 10/09/2013 5:58 AM

## 2013-10-09 NOTE — Progress Notes (Addendum)
[name] is a 33 y.o. G3P2002 at [redacted]w[redacted]d  Subjective: Pt reports she continues to experience headache which she now ranks as 9/10 on scale which is slightly improved from before after Tylenol.  Has not vomited since presentation.  Continues to c/o nausea 8/10 on scale after IV Zofran.  As previously noted, pt having difficulty with ptyalism as well as side effect of drowsiness from Diclegis and constipation from Zofran.  Using Phenergan at bedtime.  She has not previously used Reglan.  Denies hx of headaches prior to pregnancy but states "I always have bad headaches during my pregnancies and stay sick all the way through pregnancy as well".  States she has to take Tylenol for 2 days in order to get relief from headache.  Describes headache as constant.  Has order for Protonix at pharmacy but has not started med yet.  Has been able to keep down some ginger ale while here in MAU.  Denies any bleeding/cramping.  Does report s/s esophagitis due to frequent vomiting and "dry heaves".    Objective: BP 112/88  Pulse 69  Temp(Src) 98.2 F (36.8 C) (Oral)  Resp 22  Labs: Results for orders placed during the hospital encounter of 10/09/13 (from the past 24 hour(s))  URINALYSIS, ROUTINE W REFLEX MICROSCOPIC     Status: Abnormal   Collection Time    10/09/13  5:20 AM      Result Value Range   Color, Urine YELLOW  YELLOW   APPearance CLEAR  CLEAR   Specific Gravity, Urine >1.030 (*) 1.005 - 1.030   pH 6.0  5.0 - 8.0   Glucose, UA NEGATIVE  NEGATIVE mg/dL   Hgb urine dipstick NEGATIVE  NEGATIVE   Bilirubin Urine NEGATIVE  NEGATIVE   Ketones, ur NEGATIVE  NEGATIVE mg/dL   Protein, ur 30 (*) NEGATIVE mg/dL   Urobilinogen, UA 0.2  0.0 - 1.0 mg/dL   Nitrite NEGATIVE  NEGATIVE   Leukocytes, UA NEGATIVE  NEGATIVE  URINE MICROSCOPIC-ADD ON     Status: None   Collection Time    10/09/13  5:20 AM      Result Value Range   Squamous Epithelial / LPF RARE  RARE   Urine-Other MUCOUS PRESENT       Assessment / Plan: Nausea and vomiting of pregnancy Ptyalism Headache  Consult with Dr. Nelda Marseille obtained. Will begin Reglan for nausea and vomiting as outpatient.  Phenergan IV given prior to discharge.  Pt encouraged to begin Protonix as previously prescribed. Pt encouraged to consider purchase of relief bands. Comfort measure for nausea and vomiting d/w pt. Fioricet for headache given prior to discharge and Rx Fioricet for headaches given.   [place] O. 10/09/2013, 9:42 AM  Pt requests Rx for antibiotic prescribed from office sent to [facility] due to rx being sent to incorrect pharmacy - Rx ordered. Pt reports headache improved to 8/10 prior to discharge after Fioricet.    [place], Topacio Cella O. 10/09/2013, 10:30 AM

## 2013-10-20 ENCOUNTER — Ambulatory Visit: Payer: 59

## 2013-10-27 ENCOUNTER — Ambulatory Visit: Payer: 59

## 2013-11-03 ENCOUNTER — Other Ambulatory Visit: Payer: Self-pay

## 2013-11-03 ENCOUNTER — Other Ambulatory Visit: Payer: Self-pay | Admitting: Obstetrics and Gynecology

## 2013-11-05 ENCOUNTER — Other Ambulatory Visit: Payer: Self-pay | Admitting: Obstetrics and Gynecology

## 2013-11-05 DIAGNOSIS — Z3682 Encounter for antenatal screening for nuchal translucency: Secondary | ICD-10-CM

## 2013-11-09 ENCOUNTER — Ambulatory Visit (HOSPITAL_COMMUNITY): Admission: RE | Admit: 2013-11-09 | Payer: 59 | Source: Ambulatory Visit

## 2013-11-09 ENCOUNTER — Other Ambulatory Visit (HOSPITAL_COMMUNITY): Payer: 59

## 2013-11-10 ENCOUNTER — Ambulatory Visit (HOSPITAL_COMMUNITY): Admission: RE | Admit: 2013-11-10 | Payer: 59 | Source: Ambulatory Visit

## 2013-11-10 ENCOUNTER — Ambulatory Visit (HOSPITAL_COMMUNITY)
Admission: RE | Admit: 2013-11-10 | Discharge: 2013-11-10 | Disposition: A | Discharge status: Home / Self Care | Payer: 59 | Source: Ambulatory Visit | Attending: Obstetrics and Gynecology | Admitting: Obstetrics and Gynecology

## 2013-11-10 ENCOUNTER — Other Ambulatory Visit: Payer: Self-pay | Admitting: Obstetrics and Gynecology

## 2013-11-10 DIAGNOSIS — Z3682 Encounter for antenatal screening for nuchal translucency: Secondary | ICD-10-CM

## 2013-11-10 DIAGNOSIS — Z3689 Encounter for other specified antenatal screening: Secondary | ICD-10-CM | POA: Insufficient documentation

## 2013-11-16 ENCOUNTER — Other Ambulatory Visit (HOSPITAL_COMMUNITY): Payer: 59

## 2013-11-16 ENCOUNTER — Ambulatory Visit (HOSPITAL_COMMUNITY): Payer: 59

## 2013-11-26 ENCOUNTER — Inpatient Hospital Stay (HOSPITAL_COMMUNITY)
Admission: AD | Admit: 2013-11-26 | Discharge: 2013-11-27 | Disposition: A | Discharge status: Home / Self Care | Payer: 59 | Source: Ambulatory Visit | Attending: Obstetrics and Gynecology | Admitting: Obstetrics and Gynecology

## 2013-11-26 ENCOUNTER — Encounter (HOSPITAL_COMMUNITY): Payer: Self-pay | Admitting: *Deleted

## 2013-11-26 DIAGNOSIS — O210 Mild hyperemesis gravidarum: Secondary | ICD-10-CM | POA: Diagnosis present

## 2013-11-26 DIAGNOSIS — O99340 Other mental disorders complicating pregnancy, unspecified trimester: Secondary | ICD-10-CM | POA: Diagnosis not present

## 2013-11-26 DIAGNOSIS — R51 Headache: Secondary | ICD-10-CM | POA: Insufficient documentation

## 2013-11-26 DIAGNOSIS — F3289 Other specified depressive episodes: Secondary | ICD-10-CM | POA: Diagnosis not present

## 2013-11-26 DIAGNOSIS — O99810 Abnormal glucose complicating pregnancy: Secondary | ICD-10-CM | POA: Diagnosis not present

## 2013-11-26 DIAGNOSIS — F329 Major depressive disorder, single episode, unspecified: Secondary | ICD-10-CM | POA: Diagnosis not present

## 2013-11-26 LAB — COMPREHENSIVE METABOLIC PANEL
ALBUMIN: 3.5 g/dL (ref 3.5–5.2)
ALK PHOS: 37 U/L — AB (ref 39–117)
ALT: 10 U/L (ref 0–35)
AST: 10 U/L (ref 0–37)
BUN: 9 mg/dL (ref 6–23)
CO2: 24 mEq/L (ref 19–32)
Calcium: 9.1 mg/dL (ref 8.4–10.5)
Chloride: 100 mEq/L (ref 96–112)
Creatinine, Ser: 0.53 mg/dL (ref 0.50–1.10)
GFR calc Af Amer: >90 mL/min (ref >90)
GFR calc non Af Amer: >90 mL/min (ref >90)
Glucose, Bld: 86 mg/dL (ref 70–99)
POTASSIUM: 4.2 meq/L (ref 3.7–5.3)
SODIUM: 136 meq/L — AB (ref 137–147)
TOTAL PROTEIN: 7 g/dL (ref 6.0–8.3)
Total Bilirubin: <0.2 mg/dL — ABNORMAL LOW (ref 0.3–1.2)

## 2013-11-26 LAB — CBC
HCT: 34.3 % — ABNORMAL LOW (ref 36.0–46.0)
Hemoglobin: 11.9 g/dL — ABNORMAL LOW (ref 12.0–15.0)
MCH: 30.4 pg (ref 26.0–34.0)
MCHC: 34.7 g/dL (ref 30.0–36.0)
MCV: 87.7 fL (ref 78.0–100.0)
Platelets: 234 10*3/uL (ref 150–400)
RBC: 3.91 MIL/uL (ref 3.87–5.11)
RDW: 11.7 % (ref 11.5–15.5)
WBC: 7.1 10*3/uL (ref 4.0–10.5)

## 2013-11-26 LAB — URINALYSIS, ROUTINE W REFLEX MICROSCOPIC
Bilirubin Urine: NEGATIVE
Glucose, UA: 250 mg/dL — AB
Hgb urine dipstick: NEGATIVE
KETONES UR: NEGATIVE mg/dL
LEUKOCYTES UA: NEGATIVE
Nitrite: NEGATIVE
PROTEIN: NEGATIVE mg/dL
Specific Gravity, Urine: 1.02 (ref 1.005–1.030)
Urobilinogen, UA: 0.2 mg/dL (ref 0.0–1.0)
pH: 7.5 (ref 5.0–8.0)

## 2013-11-26 MED ORDER — ONDANSETRON HCL 4 MG/2ML IJ SOLN
4.0000 mg | Freq: Once | INTRAMUSCULAR | Status: AC
  Administered 2013-11-26: 4 mg via INTRAVENOUS
  Filled 2013-11-26: qty 2

## 2013-11-26 MED ORDER — BUTALBITAL-APAP-CAFFEINE 50-325-40 MG PO TABS
2.0000 | ORAL_TABLET | Freq: Once | ORAL | Status: AC
  Administered 2013-11-26: 2 via ORAL
  Filled 2013-11-26: qty 2

## 2013-11-26 MED ORDER — DEXTROSE 5 % IN LACTATED RINGERS IV BOLUS
500.0000 mL | Freq: Once | INTRAVENOUS | Status: AC
  Administered 2013-11-26: 500 mL via INTRAVENOUS

## 2013-11-26 MED ORDER — IBUPROFEN 600 MG PO TABS: 600.0000 mg | ORAL_TABLET | Freq: Once | ORAL | Status: DC

## 2013-11-26 MED ORDER — PROMETHAZINE HCL 25 MG/ML IJ SOLN
25.0000 mg | Freq: Once | INTRAMUSCULAR | Status: AC
  Administered 2013-11-26: 25 mg via INTRAVENOUS
  Filled 2013-11-26: qty 1

## 2013-11-26 NOTE — MAU Note (Signed)
I've been sick the entire pregnancy but today has been worse.

## 2013-11-26 NOTE — MAU Note (Signed)
Pt reports constant vomitting at home since lunch time. Pt reports that she usually can control her vomiting @ home, but has not been able to keep anything down today. Reglan @ right before coming to MAU. Zofran last @ 1500.

## 2013-11-27 ENCOUNTER — Encounter (HOSPITAL_COMMUNITY): Payer: Self-pay | Admitting: *Deleted

## 2013-11-27 ENCOUNTER — Inpatient Hospital Stay (HOSPITAL_COMMUNITY)
Admission: AD | Admit: 2013-11-27 | Discharge: 2013-11-28 | Disposition: A | Discharge status: Home / Self Care | Payer: 59 | Source: Ambulatory Visit | Attending: Obstetrics and Gynecology | Admitting: Obstetrics and Gynecology

## 2013-11-27 DIAGNOSIS — R5381 Other malaise: Secondary | ICD-10-CM | POA: Diagnosis not present

## 2013-11-27 DIAGNOSIS — R5383 Other fatigue: Secondary | ICD-10-CM

## 2013-11-27 DIAGNOSIS — O210 Mild hyperemesis gravidarum: Secondary | ICD-10-CM | POA: Insufficient documentation

## 2013-11-27 LAB — URINALYSIS, ROUTINE W REFLEX MICROSCOPIC
GLUCOSE, UA: NEGATIVE mg/dL
Hgb urine dipstick: NEGATIVE
KETONES UR: 15 mg/dL — AB
Leukocytes, UA: NEGATIVE
Nitrite: NEGATIVE
Protein, ur: NEGATIVE mg/dL
Specific Gravity, Urine: >1.03 — ABNORMAL HIGH (ref 1.005–1.030)
Urobilinogen, UA: 0.2 mg/dL (ref 0.0–1.0)
pH: 6 (ref 5.0–8.0)

## 2013-11-27 MED ORDER — ONDANSETRON HCL 4 MG/2ML IJ SOLN
4.0000 mg | Freq: Once | INTRAMUSCULAR | Status: AC
  Administered 2013-11-28: 4 mg via INTRAVENOUS
  Filled 2013-11-27: qty 2

## 2013-11-27 MED ORDER — PROMETHAZINE HCL 25 MG/ML IJ SOLN
25.0000 mg | Freq: Once | INTRAVENOUS | Status: AC
  Administered 2013-11-27: 25 mg via INTRAVENOUS
  Filled 2013-11-27: qty 1

## 2013-11-27 MED ORDER — LACTATED RINGERS IV BOLUS (SEPSIS): 1000.0000 mL | Freq: Once | INTRAVENOUS | Status: DC

## 2013-11-27 MED ORDER — PROMETHAZINE HCL 25 MG/ML IJ SOLN: 25.0000 mg | Freq: Once | INTRAMUSCULAR | Status: DC

## 2013-11-27 MED ORDER — BUTALBITAL-APAP-CAFFEINE 50-325-40 MG PO TABS
2.0000 | ORAL_TABLET | Freq: Four times a day (QID) | ORAL | Status: DC | PRN
  Administered 2013-11-28: 2 via ORAL
  Filled 2013-11-27: qty 2

## 2013-11-27 NOTE — MAU Note (Signed)
Nausea and vomiting. Medications not working. Patient states she feels weak and sleepy.

## 2013-11-27 NOTE — Discharge Instructions (Signed)
Hyperemesis Gravidarum Diet Hyperemesis gravidarum is a severe form of morning sickness. It is characterized by frequent and severe vomiting. It happens during the first trimester of pregnancy. It may be caused by the rapid hormone changes that happen during pregnancy. It is associated with a 5% weight loss of pre-pregnancy weight. The hyperemesis diet may be used to lessen symptoms of nausea and vomiting. EATING GUIDELINES  Eat 5 to 6 small meals daily instead of 3 large meals.  Avoid foods with strong smells.  Avoid drinking 30 minutes before and after meals.  Avoid fried or high-fat foods, such as butter and cream sauces.  Starchy foods are usually well-tolerated, such as cereal, toast, bread, potatoes, pasta, rice, and pretzels.  Eat crackers before you get out of bed in the morning.  Avoid spicy foods.  Ginger may help with nausea. Add  tsp ginger to hot tea or choose ginger tea.  Continue to take your prenatal vitamins as directed by your caregiver. SAMPLE MEAL PLAN Breakfast    cup oatmeal  1 slice toast  1 tsp heart-healthy margarine  1 tsp jelly  1 scrambled egg Midmorning Snack   1 cup low-fat yogurt Lunch   Plain ham sandwich  Carrot or celery sticks  1 small apple  3 graham crackers Midafternoon Snack   Cheese and crackers Dinner  4 oz pork tenderloin  1 small baked potato  1 tsp margarine   cup broccoli   cup grapes Evening Snack  1 cup pudding Document Released: 06/16/2007 Document Revised: 11/11/2011 Document Reviewed: 01/19/2013 [employer] Patient Information 2014 [place], [place].

## 2013-11-27 NOTE — MAU Provider Note (Signed)
History     CSN: [id number]  Arrival date and time: 11/26/13 2107   None     Chief Complaint  Patient presents with  . Emesis During Pregnancy   HPI Comments: Pt is a 33yo G3P2 at [redacted]w[redacted]d, arrives after calling CNM w c/o feeling weak and worsening N/V. Has struggled w hyperemesis since beginning of preg, also hx of hyperemesis w last 2 preg. Has been taking zofran, rohibnal and phenergan at home. Admits to not eating well, states she sleeps all day without eating. 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  Denies any bleeding, LOF or discharge.      Past Medical History  Diagnosis Date  . Urinary tract infection   . Trichomonas 02/2011    treated  . Sinusitis   . Gestational diabetes   . Abnormal Pap smear   . H/O varicella   . GDM (gestational diabetes mellitus)   . Hyperemesis     During first pregnancy  . Hx of bacterial infection     Past Surgical History  Procedure Laterality Date  . No past surgeries      Family History  Problem Relation Age of Onset  . Diabetes Mother   . Hypertension Mother   . Peripheral vascular disease Mother   . Thyroid disease Mother   . Asthma Mother   . Diabetes Father   . Hypertension Father   . Anesthesia problems Neg Hx   . Hypotension Neg Hx   . Malignant hyperthermia Neg Hx   . Pseudochol deficiency Neg Hx   . Seizures Maternal Aunt   . Alcohol abuse Maternal Aunt   . Heart attack Maternal Uncle   . Kidney disease Maternal Uncle   . Cancer Maternal Uncle     prostate  . Thyroid disease Paternal Aunt   . Heart attack Paternal Uncle   . Kidney disease Paternal Uncle     History  Substance Use Topics  . Smoking status: Never Smoker   . Smokeless tobacco: Never Used  . Alcohol Use: No    Allergies:  Allergies  Allergen Reactions  . Flagyl [Metronidazole Hcl] Swelling  . Latex Hives  . Other Other (See Comments)    Pt has a reaction to plastic tape. She breaks out with a rash and her skin peels.     Prescriptions prior to admission  Medication Sig Dispense Refill  . docusate sodium (COLACE) 100 MG capsule Take 200 mg by mouth 2 (two) times daily.      Marland Kitchen glycopyrrolate (ROBINUL) 1 MG tablet Take 1 mg by mouth every 8 (eight) hours as needed (for spitting).       . metoCLOPramide (REGLAN) 10 MG tablet Take 1 tablet (10 mg total) by mouth 4 (four) times daily -  before meals and at bedtime.  120 tablet  0  . ondansetron (ZOFRAN ODT) 8 MG disintegrating tablet Take 1 tablet (8 mg total) by mouth every 8 (eight) hours as needed for nausea or vomiting.  20 tablet  2  . pantoprazole (PROTONIX) 40 MG tablet Take 1 tablet (40 mg total) by mouth daily.  30 tablet  4  . promethazine (PHENERGAN) 50 MG tablet Take 0.5 tablets (25 mg total) by mouth every 6 (six) hours as needed for nausea or vomiting.  30 tablet  2  . albuterol (PROVENTIL HFA;VENTOLIN HFA) 108 (90 BASE) MCG/ACT inhaler Inhale into the lungs every 6 (six) hours as needed for wheezing or shortness of breath.      Marland Kitchen  butalbital-acetaminophen-caffeine (FIORICET, ESGIC) 50-325-40 MG per tablet Take 1 tablet by mouth every 6 (six) hours as needed for headache.  20 tablet  0    Review of Systems  Gastrointestinal: Positive for nausea, vomiting and constipation. Negative for abdominal pain.  Neurological: Positive for weakness and headaches.  Psychiatric/Behavioral: Positive for depression. Negative for suicidal ideas.  All other systems reviewed and are negative.   Physical Exam   Blood pressure 109/55, temperature 98.3 F (36.8 C), resp. rate 20, height 5\' 4"  (1.626 m), weight 223 lb 6.4 oz (101.334 kg).  Physical Exam  Nursing note and vitals reviewed. Constitutional: She is oriented to person, place, and time. She appears well-developed and well-nourished.  HENT:  Head: Normocephalic.  Eyes: Pupils are equal, round, and reactive to light.  Neck: Normal range of motion.  Cardiovascular: Normal rate.   Respiratory: Effort  normal.  GI: Soft. Bowel sounds are normal.  Musculoskeletal: Normal range of motion.  Neurological: She is alert and oriented to person, place, and time. She has normal reflexes.  Skin: Skin is warm and dry.  Psychiatric: She has a normal mood and affect. Her behavior is normal.   Results for orders placed during the hospital encounter of 11/26/13 (from the past 24 hour(s))  CBC     Status: Abnormal   Collection Time    11/26/13 10:05 PM      Result Value Ref Range   WBC 7.1  4.0 - 10.5 K/uL   RBC 3.91  3.87 - 5.11 MIL/uL   Hemoglobin 11.9 (*) 12.0 - 15.0 g/dL   HCT 34.3 (*) 36.0 - 46.0 %   MCV 87.7  78.0 - 100.0 fL   MCH 30.4  26.0 - 34.0 pg   MCHC 34.7  30.0 - 36.0 g/dL   RDW 11.7  11.5 - 15.5 %   Platelets 234  150 - 400 K/uL  COMPREHENSIVE METABOLIC PANEL     Status: Abnormal   Collection Time    11/26/13 10:05 PM      Result Value Ref Range   Sodium 136 (*) 137 - 147 mEq/L   Potassium 4.2  3.7 - 5.3 mEq/L   Chloride 100  96 - 112 mEq/L   CO2 24  19 - 32 mEq/L   Glucose, Bld 86  70 - 99 mg/dL   BUN 9  6 - 23 mg/dL   Creatinine, Ser 0.53  0.50 - 1.10 mg/dL   Calcium 9.1  8.4 - 10.5 mg/dL   Total Protein 7.0  6.0 - 8.3 g/dL   Albumin 3.5  3.5 - 5.2 g/dL   AST 10  0 - 37 U/L   ALT 10  0 - 35 U/L   Alkaline Phosphatase 37 (*) 39 - 117 U/L   Total Bilirubin <0.2 (*) 0.3 - 1.2 mg/dL   GFR calc non Af Amer >90  >90 mL/min   GFR calc Af Amer >90  >90 mL/min  URINALYSIS, ROUTINE W REFLEX MICROSCOPIC     Status: Abnormal   Collection Time    11/26/13 11:45 PM      Result Value Ref Range   Color, Urine YELLOW  YELLOW   APPearance CLEAR  CLEAR   Specific Gravity, Urine 1.020  1.005 - 1.030   pH 7.5  5.0 - 8.0   Glucose, UA 250 (*) NEGATIVE mg/dL   Hgb urine dipstick NEGATIVE  NEGATIVE   Bilirubin Urine NEGATIVE  NEGATIVE   Ketones, ur NEGATIVE  NEGATIVE mg/dL  Protein, ur NEGATIVE  NEGATIVE mg/dL   Urobilinogen, UA 0.2  0.0 - 1.0 mg/dL   Nitrite NEGATIVE  NEGATIVE    Leukocytes, UA NEGATIVE  NEGATIVE      MAU Course  Procedures    Assessment and Plan  IUP at 16wks Hyperemesis Headache Depression  Presumptive GDM   fiorcet - headache slightly improved zofran IV Phenergan IV - nausea improved  D5LR bolus Labs stable  D/c home, f/u as scheduled Long discussion regarding diet and coping strategies Pt agrees w referral to therapist - will office schedule Denies SI/HI   Taysia Rivere M 11/27/2013, 1:05 AM

## 2013-11-28 ENCOUNTER — Encounter: Payer: Self-pay | Admitting: Obstetrics and Gynecology

## 2013-11-28 DIAGNOSIS — O210 Mild hyperemesis gravidarum: Secondary | ICD-10-CM | POA: Diagnosis not present

## 2013-11-28 MED ORDER — BUTALBITAL-APAP-CAFFEINE 50-325-40 MG PO TABS: 1.0000 | ORAL_TABLET | Freq: Four times a day (QID) | ORAL | Status: DC | PRN

## 2013-11-28 NOTE — MAU Provider Note (Signed)
History     CSN: [id number]  Arrival date and time: 11/27/13 2218   First Provider Initiated Contact with Patient 11/27/13 2243      Chief Complaint  Patient presents with  . Nausea  . Emesis   HPI Comments: Pt is a G3P2 at [redacted]w[redacted]d arrives after calling CNM that she was still feeling severe N/V since discharge from MAU yesterday. Pt states she has been unable to keep any food or fluids down, c/o headache. States she was at work and was "vomiting everywhere"  denies any cramping, or VB.      Past Medical History  Diagnosis Date  . Urinary tract infection   . Trichomonas 02/2011    treated  . Sinusitis   . Gestational diabetes   . Abnormal Pap smear   . H/O varicella   . GDM (gestational diabetes mellitus)   . Hyperemesis     During first pregnancy  . Hx of bacterial infection     Past Surgical History  Procedure Laterality Date  . No past surgeries      Family History  Problem Relation Age of Onset  . Diabetes Mother   . Hypertension Mother   . Peripheral vascular disease Mother   . Thyroid disease Mother   . Asthma Mother   . Diabetes Father   . Hypertension Father   . Anesthesia problems Neg Hx   . Hypotension Neg Hx   . Malignant hyperthermia Neg Hx   . Pseudochol deficiency Neg Hx   . Seizures Maternal Aunt   . Alcohol abuse Maternal Aunt   . Heart attack Maternal Uncle   . Kidney disease Maternal Uncle   . Cancer Maternal Uncle     prostate  . Thyroid disease Paternal Aunt   . Heart attack Paternal Uncle   . Kidney disease Paternal Uncle     History  Substance Use Topics  . Smoking status: Never Smoker   . Smokeless tobacco: Never Used  . Alcohol Use: No    Allergies:  Allergies  Allergen Reactions  . Flagyl [Metronidazole Hcl] Swelling  . Latex Hives  . Other Other (See Comments)    Pt has a reaction to plastic tape. She breaks out with a rash and her skin peels.    Prescriptions prior to admission  Medication Sig Dispense Refill  .  docusate sodium (COLACE) 100 MG capsule Take 200 mg by mouth 2 (two) times daily.      Marland Kitchen glycopyrrolate (ROBINUL) 1 MG tablet Take 1 mg by mouth every 8 (eight) hours as needed (for spitting).       . metoCLOPramide (REGLAN) 10 MG tablet Take 1 tablet (10 mg total) by mouth 4 (four) times daily -  before meals and at bedtime.  120 tablet  0  . ondansetron (ZOFRAN ODT) 8 MG disintegrating tablet Take 1 tablet (8 mg total) by mouth every 8 (eight) hours as needed for nausea or vomiting.  20 tablet  2  . pantoprazole (PROTONIX) 40 MG tablet Take 1 tablet (40 mg total) by mouth daily.  30 tablet  4  . promethazine (PHENERGAN) 50 MG tablet Take 0.5 tablets (25 mg total) by mouth every 6 (six) hours as needed for nausea or vomiting.  30 tablet  2  . albuterol (PROVENTIL HFA;VENTOLIN HFA) 108 (90 BASE) MCG/ACT inhaler Inhale into the lungs every 6 (six) hours as needed for wheezing or shortness of breath.      . [DISCONTINUED] butalbital-acetaminophen-caffeine (FIORICET, ESGIC) 50-325-40 MG  per tablet Take 1 tablet by mouth every 6 (six) hours as needed for headache.  20 tablet  0    Review of Systems  Gastrointestinal: Positive for heartburn, nausea and vomiting. Negative for abdominal pain.  Neurological: Positive for headaches.  Psychiatric/Behavioral: Positive for depression. Negative for suicidal ideas.  All other systems reviewed and are negative.   Physical Exam   Blood pressure 118/78, pulse 105, temperature 98.5 F (36.9 C), resp. rate 20, height 5\' 4"  (1.626 m), weight 221 lb (100.245 kg).  Physical Exam  Nursing note and vitals reviewed. Constitutional: She is oriented to person, place, and time. She appears well-developed and well-nourished.  HENT:  Head: Normocephalic.  Eyes: Pupils are equal, round, and reactive to light.  Neck: Normal range of motion.  Cardiovascular: Normal rate.   Respiratory: Effort normal.  GI: Soft.  Genitourinary:  Deferred   Musculoskeletal: Normal  range of motion.  Neurological: She is alert and oriented to person, place, and time. She has normal reflexes.  Skin: Skin is warm and dry.  Psychiatric: She has a normal mood and affect. Her behavior is normal.      MAU Course  Procedures    Assessment and Plan  IUP at 16wks Hyperemesis  IVF's IV zofran fiorcet Now tol PO  (of note) pt did not have any vomiting while in MAU Requesting discharge dc'd home stable w diet instructions Requests to be OOW until 3/30 F/u office as scheduled   Hilman Kissling M 11/28/2013, 3:42 AM

## 2013-11-28 NOTE — Discharge Instructions (Signed)
Hyperemesis Gravidarum  Hyperemesis gravidarum is a severe form of nausea and vomiting that happens during pregnancy. Hyperemesis is worse than morning sickness. It may cause you to have nausea or vomiting all day for many days. It may keep you from eating and drinking enough food and liquids. Hyperemesis usually occurs during the first half (the first 20 weeks) of pregnancy. It often goes away once a woman is in her second half of pregnancy. However, sometimes hyperemesis continues through an entire pregnancy.   CAUSES   The cause of this condition is not completely known but is thought to be related to changes in the body's hormones when pregnant. It could be from the high level of the pregnancy hormone or an increase in estrogen in the body.   SIGNS AND SYMPTOMS   · Severe nausea and vomiting.  · Nausea that does not go away.  · Vomiting that does not allow you to keep any food down.  · Weight loss and body fluid loss (dehydration).  · Having no desire to eat or not liking food you have previously enjoyed.  DIAGNOSIS   Your health care provider will do a physical exam and ask you about your symptoms. He or she may also order blood tests and urine tests to make sure something else is not causing the problem.   TREATMENT   You may only need medicine to control the problem. If medicines do not control the nausea and vomiting, you will be treated in the hospital to prevent dehydration, increased acid in the blood (acidosis), weight loss, and changes in the electrolytes in your body that may harm the unborn baby (fetus). You may need IV fluids.   HOME CARE INSTRUCTIONS   · Only take over-the-counter or prescription medicines as directed by your health care provider.  · Try eating a couple of dry crackers or toast in the morning before getting out of bed.  · Avoid foods and smells that upset your stomach.  · Avoid fatty and spicy foods.  · Eat 5 6 small meals a day.  · Do not drink when eating meals. Drink between  meals.  · For snacks, eat high-protein foods, such as cheese.  · Eat or suck on things that have ginger in them. Ginger helps nausea.  · Avoid food preparation. The smell of food can spoil your appetite.  · Avoid iron pills and iron in your multivitamins until after 3 4 months of being pregnant. However, consult with your health care provider before stopping any prescribed iron pills.  SEEK MEDICAL CARE IF:   · Your abdominal pain increases.  · You have a severe headache.  · You have vision problems.  · You are losing weight.  SEEK IMMEDIATE MEDICAL CARE IF:   · You are unable to keep fluids down.  · You vomit blood.  · You have constant nausea and vomiting.  · You have excessive weakness.  · You have extreme thirst.  · You have dizziness or fainting.  · You have a fever or persistent symptoms for more than 2 3 days.  · You have a fever and your symptoms suddenly get worse.  MAKE SURE YOU:   · Understand these instructions.  · Will watch your condition.  · Will get help right away if you are not doing well or get worse.  Document Released: 08/19/2005 Document Revised: 06/09/2013 Document Reviewed: 03/31/2013  [employer]® Patient Information ©2014 [employer].

## 2013-11-29 ENCOUNTER — Inpatient Hospital Stay (HOSPITAL_COMMUNITY)
Admission: AD | Admit: 2013-11-29 | Discharge: 2013-11-30 | DRG: 781 | Disposition: A | Discharge status: Home / Self Care | Payer: 59 | Source: Ambulatory Visit | Attending: Obstetrics and Gynecology | Admitting: Obstetrics and Gynecology

## 2013-11-29 ENCOUNTER — Encounter (HOSPITAL_COMMUNITY): Payer: Self-pay | Admitting: *Deleted

## 2013-11-29 DIAGNOSIS — O210 Mild hyperemesis gravidarum: Secondary | ICD-10-CM | POA: Diagnosis present

## 2013-11-29 DIAGNOSIS — IMO0002 Reserved for concepts with insufficient information to code with codable children: Secondary | ICD-10-CM | POA: Diagnosis present

## 2013-11-29 DIAGNOSIS — K117 Disturbances of salivary secretion: Secondary | ICD-10-CM

## 2013-11-29 DIAGNOSIS — F329 Major depressive disorder, single episode, unspecified: Secondary | ICD-10-CM | POA: Diagnosis present

## 2013-11-29 DIAGNOSIS — Z91040 Latex allergy status: Secondary | ICD-10-CM

## 2013-11-29 DIAGNOSIS — O219 Vomiting of pregnancy, unspecified: Secondary | ICD-10-CM

## 2013-11-29 DIAGNOSIS — O24919 Unspecified diabetes mellitus in pregnancy, unspecified trimester: Secondary | ICD-10-CM | POA: Diagnosis present

## 2013-11-29 DIAGNOSIS — O99340 Other mental disorders complicating pregnancy, unspecified trimester: Secondary | ICD-10-CM | POA: Diagnosis present

## 2013-11-29 DIAGNOSIS — O9989 Other specified diseases and conditions complicating pregnancy, childbirth and the puerperium: Secondary | ICD-10-CM

## 2013-11-29 DIAGNOSIS — O99891 Other specified diseases and conditions complicating pregnancy: Secondary | ICD-10-CM | POA: Diagnosis present

## 2013-11-29 DIAGNOSIS — F3289 Other specified depressive episodes: Secondary | ICD-10-CM | POA: Diagnosis present

## 2013-11-29 DIAGNOSIS — O24419 Gestational diabetes mellitus in pregnancy, unspecified control: Secondary | ICD-10-CM

## 2013-11-29 DIAGNOSIS — O99810 Abnormal glucose complicating pregnancy: Principal | ICD-10-CM | POA: Diagnosis present

## 2013-11-29 DIAGNOSIS — R197 Diarrhea, unspecified: Secondary | ICD-10-CM

## 2013-11-29 MED ORDER — LOPERAMIDE HCL 2 MG PO CAPS
4.0000 mg | ORAL_CAPSULE | Freq: Once | ORAL | Status: AC
  Administered 2013-11-30: 4 mg via ORAL
  Filled 2013-11-29: qty 2

## 2013-11-29 MED ORDER — LACTATED RINGERS IV SOLN
INTRAVENOUS | Status: DC
  Administered 2013-11-30 (×3): via INTRAVENOUS

## 2013-11-29 MED ORDER — ZOLPIDEM TARTRATE 5 MG PO TABS: 5.0000 mg | ORAL_TABLET | Freq: Every evening | ORAL | Status: DC | PRN

## 2013-11-29 MED ORDER — LOPERAMIDE HCL 2 MG PO CAPS
2.0000 mg | ORAL_CAPSULE | ORAL | Status: DC | PRN
  Filled 2013-11-29: qty 1

## 2013-11-29 MED ORDER — LACTATED RINGERS IV BOLUS (SEPSIS)
500.0000 mL | Freq: Once | INTRAVENOUS | Status: AC
  Administered 2013-11-29: 1000 mL via INTRAVENOUS

## 2013-11-29 MED ORDER — ONDANSETRON HCL 4 MG/2ML IJ SOLN
4.0000 mg | Freq: Three times a day (TID) | INTRAMUSCULAR | Status: DC
  Administered 2013-11-30 (×3): 4 mg via INTRAVENOUS
  Filled 2013-11-29 (×3): qty 2

## 2013-11-29 MED ORDER — PRENATAL MULTIVITAMIN CH
1.0000 | ORAL_TABLET | Freq: Every day | ORAL | Status: DC
  Administered 2013-11-30: 1 via ORAL
  Filled 2013-11-29: qty 1

## 2013-11-29 NOTE — MAU Note (Signed)
Patient complains of diarrhea that started this morning. Nausea and vomiting this entire pregnancy. Took zofran and imodium  this afternoon around 3 pm.

## 2013-11-29 NOTE — H&P (Signed)
[name] is a 33 y.o. female, G3P2002 at 46 4/7 weeks, presenting with intractable diarrhea today, since early am.  Denies abdominal pain other than GI cramping with diarrhea.  Has been seen in MAU 3/27 and 3/28 for N/V--has been on Zofran, Reglan, and Robinul at home without benefit.  No known viral exposures--did attend child's birthday party over weekend, but no known exposures there.  Has not been able to eat or drink anything of substance last several days, despite IV hydration on 3/27 and 3/28.    Also demonstrates possible depression--has been referred to therapist, but has not been scheduled yet.    Dx wiith presumptive diabetes, due to hx of GDM, diet controlled in previous pregnancy, with Hgb A1C 6.1 at NOB and elevated early glucola.  Has not yet been checking CBGs at home and has not been able to implement dietary modifications due to persistent N/V.  History of present pregnancy: Patient entered care at 8 weeks.   EDC of 05/12/14 was established by [place] at 6 weeks.  Had [place] at 11 5/7 weeks at [place] for 1st trimester screen, but was too late for that testing--EDC 05/08/14 by that date. Significant prenatal events:  Persistent N/V, with several MAU visits for treatment of same.  Recent visits on 3/27 and 3/28 for N/V.  On Zofran, Reglan, and Robinul at home, without much benefit.  Seen in MAU in January for severe constipation, received enemas with benefit. Last evaluation:  11/27/13 in MAU--received IV fluids, phenergan.  Seen in office 3/18, with report of "stomach not feeling right".   Patient Active Problem List   Diagnosis Date Noted  . Diarrhea 11/29/2013  . Gestational diabetes 11/29/2013  . Nausea and vomiting in pregnancy 09/27/2013  . Ptyalism 09/27/2013    History OB History   Grav Para Term Preterm Abortions TAB SAB Ect Mult Living   3 2 2  0 0 0 0 0 0 2    2006--[facility], 37 weeks, rapid labor, 6+8, epidural, female, delivered in [place], [place]--had hyperemesis with that  pregnancy, gest diabetes, diet controlled. 2012--[facility], 38 5/7 weeks, induction, 7+2, epidural, delivered at [facility] with [facility], gestational diabetes, diet controlled  Past Medical History  Diagnosis Date  . Urinary tract infection   . Trichomonas 02/2011    treated  . Sinusitis   . Gestational diabetes   . Abnormal Pap smear   . H/O varicella   . GDM (gestational diabetes mellitus)   . Hyperemesis     During first pregnancy  . Hx of bacterial infection    Past Surgical History  Procedure Laterality Date  . No past surgeries     Family History: family history includes Alcohol abuse in her maternal aunt; Asthma in her mother; Cancer in her maternal uncle; Diabetes in her father and mother; Heart attack in her maternal uncle and paternal uncle; Hypertension in her father and mother; Kidney disease in her maternal uncle and paternal uncle; Peripheral vascular disease in her mother; Seizures in her maternal aunt; Thyroid disease in her mother and paternal aunt. There is no history of Anesthesia problems, Hypotension, Malignant hyperthermia, or Pseudochol deficiency.  Social History:  reports that she has never smoked. She has never used smokeless tobacco. She reports that she does not drink alcohol or use illicit drugs. She is employed as Quarry manager.   ROS:  N/V, diarrhea, inability to eat or drink today    Blood pressure 117/72, pulse 83, temperature 97.2 F (36.2 C), temperature source Oral, resp. rate  18.  Exam Physical Exam  Appear ill, lying over side rail of bed, lips very dry Had diarrhea stool upon arrival, unable to collect uncontaminated urine specimen. Chest clear Heart RRR without murmur Abd gravid, NT--no rebound or guarding Pelvic--deferred Ext WNL  FHR 170 per doppler.  Prenatal labs: ABO, Rh:  A+ Antibody:  Neg Rubella:  Immune RPR:   NR HBsAg:   Neg HIV:   NR GBS:    Hgb A1C 6.1 at NOB  Assessment/Plan: IUP at 16 4/7 weeks Diarrhea Hyperemesis Gestational  diabetes Depression  Plan: Admit to 3rd floor IV hydration Imodium prn Zofran  C diff and O/P testing CBC, diff, CMP, amylase, lipase I&O, with daily weights FBS in am--will implement further CBG testing when patient begins to eat. NPO at present. SW consult in am.  Donnel Saxon 11/29/2013, 11:38 PM

## 2013-11-30 LAB — COMPREHENSIVE METABOLIC PANEL
ALBUMIN: 3.4 g/dL — AB (ref 3.5–5.2)
ALK PHOS: 44 U/L (ref 39–117)
ALT: 23 U/L (ref 0–35)
AST: 29 U/L (ref 0–37)
BILIRUBIN TOTAL: 0.2 mg/dL — AB (ref 0.3–1.2)
BUN: 6 mg/dL (ref 6–23)
CO2: 18 mEq/L — ABNORMAL LOW (ref 19–32)
CREATININE: 0.52 mg/dL (ref 0.50–1.10)
Calcium: 8.6 mg/dL (ref 8.4–10.5)
Chloride: 96 mEq/L (ref 96–112)
GFR calc Af Amer: >90 mL/min (ref >90)
GFR calc non Af Amer: >90 mL/min (ref >90)
Glucose, Bld: 100 mg/dL — ABNORMAL HIGH (ref 70–99)
Potassium: 3.3 mEq/L — ABNORMAL LOW (ref 3.7–5.3)
Sodium: 130 mEq/L — ABNORMAL LOW (ref 137–147)
TOTAL PROTEIN: 6.9 g/dL (ref 6.0–8.3)

## 2013-11-30 LAB — GLUCOSE, CAPILLARY
Glucose-Capillary: 105 mg/dL — ABNORMAL HIGH (ref 70–99)
Glucose-Capillary: 86 mg/dL (ref 70–99)
Glucose-Capillary: 91 mg/dL (ref 70–99)

## 2013-11-30 LAB — URINALYSIS, ROUTINE W REFLEX MICROSCOPIC
BILIRUBIN URINE: NEGATIVE
Glucose, UA: NEGATIVE mg/dL
HGB URINE DIPSTICK: NEGATIVE
Ketones, ur: NEGATIVE mg/dL
Leukocytes, UA: NEGATIVE
Nitrite: NEGATIVE
PROTEIN: NEGATIVE mg/dL
SPECIFIC GRAVITY, URINE: 1.025 (ref 1.005–1.030)
Urobilinogen, UA: 0.2 mg/dL (ref 0.0–1.0)
pH: 6 (ref 5.0–8.0)

## 2013-11-30 LAB — CBC WITH DIFFERENTIAL/PLATELET
BASOS PCT: 0 % (ref 0–1)
Basophils Absolute: 0 10*3/uL (ref 0.0–0.1)
EOS ABS: 0 10*3/uL (ref 0.0–0.7)
Eosinophils Relative: 0 % (ref 0–5)
HEMATOCRIT: 33.5 % — AB (ref 36.0–46.0)
HEMOGLOBIN: 11.7 g/dL — AB (ref 12.0–15.0)
LYMPHS ABS: 0.5 10*3/uL — AB (ref 0.7–4.0)
Lymphocytes Relative: 9 % — ABNORMAL LOW (ref 12–46)
MCH: 30.2 pg (ref 26.0–34.0)
MCHC: 34.9 g/dL (ref 30.0–36.0)
MCV: 86.6 fL (ref 78.0–100.0)
MONO ABS: 0.6 10*3/uL (ref 0.1–1.0)
Monocytes Relative: 11 % (ref 3–12)
Neutro Abs: 4.4 10*3/uL (ref 1.7–7.7)
Neutrophils Relative %: 80 % — ABNORMAL HIGH (ref 43–77)
Platelets: 199 10*3/uL (ref 150–400)
RBC: 3.87 MIL/uL (ref 3.87–5.11)
RDW: 11.7 % (ref 11.5–15.5)
WBC: 5.5 10*3/uL (ref 4.0–10.5)

## 2013-11-30 LAB — LIPASE, BLOOD: LIPASE: 10 U/L — AB (ref 11–59)

## 2013-11-30 LAB — AMYLASE: AMYLASE: 24 U/L (ref 0–105)

## 2013-11-30 MED ORDER — PANTOPRAZOLE SODIUM 20 MG PO TBEC
20.0000 mg | DELAYED_RELEASE_TABLET | Freq: Two times a day (BID) | ORAL | Status: DC
  Administered 2013-11-30: 20 mg via ORAL
  Filled 2013-11-30 (×3): qty 1

## 2013-11-30 MED ORDER — ACETAMINOPHEN 325 MG PO TABS
650.0000 mg | ORAL_TABLET | Freq: Four times a day (QID) | ORAL | Status: DC | PRN
  Administered 2013-11-30 (×2): 650 mg via ORAL
  Filled 2013-11-30 (×2): qty 2

## 2013-11-30 MED ORDER — GLYCOPYRROLATE 1 MG PO TABS
1.0000 mg | ORAL_TABLET | Freq: Three times a day (TID) | ORAL | Status: DC
  Administered 2013-11-30 (×2): 1 mg via ORAL
  Filled 2013-11-30 (×4): qty 1

## 2013-11-30 MED ORDER — METOCLOPRAMIDE HCL 10 MG PO TABS
10.0000 mg | ORAL_TABLET | Freq: Three times a day (TID) | ORAL | Status: DC
  Administered 2013-11-30 (×2): 10 mg via ORAL
  Filled 2013-11-30 (×2): qty 1

## 2013-11-30 NOTE — Progress Notes (Signed)
Pt discharged to home with significant other.  Condition stable.  Pt ambulated to car with RN.  No equipment for home ordered at discharge.

## 2013-11-30 NOTE — Progress Notes (Signed)
Patient ID: [name], female   DOB: 1981/05/01, 33 y.o.   MRN: [id number] Pt c/o some nausea BP 103/48  Pulse 72  Temp(Src) 97.5 F (36.4 C) (Oral)  Resp 18  Ht 5\' 4"  (1.626 m)  Wt 98.431 kg (217 lb)  BMI 37.23 kg/m2  SpO2 100% Physical Examination: General appearance - alert, well appearing, and in no distress Chest - clear to auscultation, no wheezes, rales or rhonchi, symmetric air entry Heart - normal rate and regular rhythm Abdomen - soft, nontender, nondistended, no masses or organomegaly Extremities - peripheral pulses normal, no pedal edema, no clubbing or cyanosis No further stools robinol for ptylism protonix  Advance to a bland diet

## 2013-11-30 NOTE — Progress Notes (Addendum)
Subjective: Patient able to consume bland diet without issues. Reports feeling better.  Continues to deny nausea, diarrhea, and vomiting.  No VB, LOF, or contractions  Objective: No distress noted Filed Vitals:   11/30/13 1150  BP: 103/48  Pulse: 72  Temp: 97.5 F (36.4 C)  Resp: 18   Assessment: IUP at 16.5wks Diarrhea, resolved Hyperemesis, stable  R/O Depression GDM-A1  Plan: Patient to see social worker tonight, if available, or will remain overnight for SW consult tomorrow  Discussed with Dr. Sophronia Simas  Continue current plan of care  Zerrick Hanssen LYNN, CNM, MSN

## 2013-11-30 NOTE — Progress Notes (Signed)
RN informed of patients discharge this afternoon at 15:00pm, but was unable to secure social work consult.  RN called or left messages for both [facility] social workers, [place], and [place].  RN spoke to [employer], CSW on call for nights, who stated that she was unable to leave the [facility] ED tonight and that she would leave message for Gerri Spore, CSW to see patient in the morning.  Pt unwilling to stay overnight and requests to speak to MD. RN phoned Dr. Charlesetta Garibaldi who said that patient could go home this evening and that CNM would write discharge.  Gavin Pound, CNM spoke to patient face-to-face in Dr. Berneta Sages stead.  Pt to follow-up at office tomorrow at 2:00pm.

## 2013-11-30 NOTE — Progress Notes (Signed)
Hospital day # 1 pregnancy at [redacted]w[redacted]d--Admitted for Intractable Diarrhea .  S:  Doing well, denies diarrhea since admission.  Also denies pain, cramping/contractions, and N/V.  No VB or LOF.  O: BP 103/48  Pulse 72  Temp(Src) 97.5 F (36.4 C) (Oral)  Resp 18  Ht 5\' 4"  (1.626 m)  Wt 217 lb (98.431 kg)  BMI 37.23 kg/m2  SpO2 100% No s/s of distress.       Fetal Heart Rate: 170bpm per doppler       Uterus non-tender      Extremities: no significant edema and no signs of DVT          Labs:   Results for orders placed during the hospital encounter of 11/29/13 (from the past 24 hour(s))  CBC WITH DIFFERENTIAL     Status: Abnormal   Collection Time    11/30/13 12:07 AM      Result Value Ref Range   WBC 5.5  4.0 - 10.5 K/uL   RBC 3.87  3.87 - 5.11 MIL/uL   Hemoglobin 11.7 (*) 12.0 - 15.0 g/dL   HCT 33.5 (*) 36.0 - 46.0 %   MCV 86.6  78.0 - 100.0 fL   MCH 30.2  26.0 - 34.0 pg   MCHC 34.9  30.0 - 36.0 g/dL   RDW 11.7  11.5 - 15.5 %   Platelets 199  150 - 400 K/uL   Neutrophils Relative % 80 (*) 43 - 77 %   Neutro Abs 4.4  1.7 - 7.7 K/uL   Lymphocytes Relative 9 (*) 12 - 46 %   Lymphs Abs 0.5 (*) 0.7 - 4.0 K/uL   Monocytes Relative 11  3 - 12 %   Monocytes Absolute 0.6  0.1 - 1.0 K/uL   Eosinophils Relative 0  0 - 5 %   Eosinophils Absolute 0.0  0.0 - 0.7 K/uL   Basophils Relative 0  0 - 1 %   Basophils Absolute 0.0  0.0 - 0.1 K/uL  COMPREHENSIVE METABOLIC PANEL     Status: Abnormal   Collection Time    11/30/13 12:07 AM      Result Value Ref Range   Sodium 130 (*) 137 - 147 mEq/L   Potassium 3.3 (*) 3.7 - 5.3 mEq/L   Chloride 96  96 - 112 mEq/L   CO2 18 (*) 19 - 32 mEq/L   Glucose, Bld 100 (*) 70 - 99 mg/dL   BUN 6  6 - 23 mg/dL   Creatinine, Ser 0.52  0.50 - 1.10 mg/dL   Calcium 8.6  8.4 - 10.5 mg/dL   Total Protein 6.9  6.0 - 8.3 g/dL   Albumin 3.4 (*) 3.5 - 5.2 g/dL   AST 29  0 - 37 U/L   ALT 23  0 - 35 U/L   Alkaline Phosphatase 44  39 - 117 U/L   Total Bilirubin  0.2 (*) 0.3 - 1.2 mg/dL   GFR calc non Af Amer >90  >90 mL/min   GFR calc Af Amer >90  >90 mL/min  AMYLASE     Status: None   Collection Time    11/30/13 12:07 AM      Result Value Ref Range   Amylase 24  0 - 105 U/L  LIPASE, BLOOD     Status: Abnormal   Collection Time    11/30/13 12:07 AM      Result Value Ref Range   Lipase 10 (*) 11 - 59 U/L  GLUCOSE, CAPILLARY     Status: Abnormal   Collection Time    11/30/13 12:07 AM      Result Value Ref Range   Glucose-Capillary 105 (*) 70 - 99 mg/dL  URINALYSIS, ROUTINE W REFLEX MICROSCOPIC     Status: None   Collection Time    11/30/13  1:02 AM      Result Value Ref Range   Color, Urine YELLOW  YELLOW   APPearance CLEAR  CLEAR   Specific Gravity, Urine 1.025  1.005 - 1.030   pH 6.0  5.0 - 8.0   Glucose, UA NEGATIVE  NEGATIVE mg/dL   Hgb urine dipstick NEGATIVE  NEGATIVE   Bilirubin Urine NEGATIVE  NEGATIVE   Ketones, ur NEGATIVE  NEGATIVE mg/dL   Protein, ur NEGATIVE  NEGATIVE mg/dL   Urobilinogen, UA 0.2  0.0 - 1.0 mg/dL   Nitrite NEGATIVE  NEGATIVE   Leukocytes, UA NEGATIVE  NEGATIVE  GLUCOSE, CAPILLARY     Status: None   Collection Time    11/30/13  6:04 AM      Result Value Ref Range   Glucose-Capillary 86  70 - 99 mg/dL        Meds:  . glycopyrrolate  1 mg Oral TID  . metoCLOPramide  10 mg Oral TID AC & HS  . ondansetron (ZOFRAN) IV  4 mg Intravenous 3 times per day  . pantoprazole  20 mg Oral BID  . prenatal multivitamin  1 tablet Oral Q1200    A: [redacted]w[redacted]d with Diarrhea Hyperemesis, stable R/O Depression GDM-A1     gradually improving  P: Diet changed from NPO to soft bland, patient informed of need for diabetic choices. Await SW consult Reglan 10mg  ordered for [facility] FSBS AC Anticipate d/c based on I&O after meal Continue current plan of care MDs will follow  Etana Beets, [place], [place] 11/30/2013 12:20 PM

## 2013-11-30 NOTE — Discharge Instructions (Signed)
Glucose, Blood Sugar, Fasting Blood Sugar This is a test to measure your blood sugar. Glucose is a simple sugar that serves as the main source of energy for the body. The carbohydrates we eat are broken down into glucose (and a few other simple sugars), absorbed by the small intestine, and circulated throughout the body. Most of the body's cells require glucose for energy production; brain and nervous system cells not only rely on glucose for energy, they can only function when glucose levels in the blood remain above a certain level.  The body's use of glucose hinges on the availability of insulin, a hormone produced by the pancreas. Insulin acts as a Control and instrumentation engineer, transporting glucose into the body's cells, directing the body to store excess glucose as glycogen (for short-term storage) and/or as triglycerides in fat cells. We can not live without glucose or insulin, and they must be in balance.  Normally, blood glucose levels rise slightly after a meal, and insulin is secreted to lower them, with the amount of insulin released matched up with the size and content of the meal. If blood glucose levels drop too low, such as might occur in between meals or after a strenuous workout, glucagon (another pancreatic hormone) is secreted to tell the liver to turn some glycogen back into glucose, raising the blood glucose levels. If the glucose/insulin feedback mechanism is working properly, the amount of glucose in the blood remains fairly stable. If the balance is disrupted and glucose levels in the blood rise, then the body tries to restore the balance, both by increasing insulin production and by excreting glucose in the urine.  PREPARATION FOR TEST A blood sample drawn from a vein in your arm or, for a self check, a drop of blood from a skin prick; in general, it may be recommended that you fast before having a blood glucose test; sometimes a random (no preparation) urine sample is used. Your caregiver will  instruct you as to what they want prior to your testing. NORMAL FINDINGS Normal values depend on many factors. Your lab will provide a range of normal values with your test results. The following information summarizes the meaning of the test results. These are based on the clinical practice recommendations of the American Diabetes Association.  FASTING BLOOD GLUCOSE  From 70 to 99 mg/dL (3.9 to 5.5 mmol/L): Normal glucose tolerance  From 100 to 125 mg/dL (5.6 to 6.9 mmol/L):Impaired fasting glucose (pre-diabetes)  126 mg/dL (7.0 mmol/L) and above on more than one testing occasion: Diabetes ORAL GLUCOSE TOLERANCE TEST (OGTT) [EXCEPT PREGNANCY] (2 HOURS AFTER A 75-GRAM GLUCOSE DRINK)  Less than 140 mg/dL (7.8 mmol/L): Normal glucose tolerance  From 140 to 200 mg/dL (7.8 to 11.1 mmol/L): Impaired glucose tolerance (pre-diabetes)  Over 200 mg/dL (11.1 mmol/L) on more than one testing occasion: Diabetes GESTATIONAL DIABETES SCREENING: GLUCOSE CHALLENGE TEST (1 HOUR AFTER A 50-GRAM GLUCOSE DRINK)  Less than 140* mg/dL (7.8 mmol/L): Normal glucose tolerance  140* mg/dL (7.8 mmol/L) and over: Abnormal, needs OGTT (see below) * Some use a cutoff of More Than 130 mg/dL (7.2 mmol/L) because that identifies 90% of women with gestational diabetes, compared to 80% identified using the threshold of More Than 140 mg/dL (7.8 mmol/L). GESTATIONAL DIABETES DIAGNOSTIC: OGTT (100-GRAM GLUCOSE DRINK)  Fasting*..........................................95 mg/dL (5.3 mmol/L)  1 hour after glucose load*..............180 mg/dL (10.0 mmol/L)  2 hours after glucose load*.............155 mg/dL (8.6 mmol/L)  3 hours after glucose load* **.........140 mg/dL (7.8 mmol/L) * If two or more values  are above the criteria, gestational diabetes is diagnosed. ** A 75-gram glucose load may be used, although this method is not as well validated as the 100-gram OGTT; the 3-hour sample is not drawn if 75 grams is used.    Ranges for normal findings may vary among different laboratories and hospitals. You should always check with your doctor after having lab work or other tests done to discuss the meaning of your test results and whether your values are considered within normal limits. MEANING OF TEST  Your caregiver will go over the test results with you and discuss the importance and meaning of your results, as well as treatment options and the need for additional tests if necessary. OBTAINING THE TEST RESULTS It is your responsibility to obtain your test results. Ask the lab or department performing the test when and how you will get your results. Document Released: 09/20/2004 Document Revised: 11/11/2011 Document Reviewed: 07/30/2008 [facility] Patient Information 2014 [place], [place]. Diet for Diarrhea, Adult Frequent, runny stools (diarrhea) may be caused or worsened by food or drink. Diarrhea may be relieved by changing your diet. Since diarrhea can last up to 7 days, it is easy for you to lose too much fluid from the body and become dehydrated. Fluids that are lost need to be replaced. Along with a modified diet, make sure you drink enough fluids to keep your urine clear or pale yellow. DIET INSTRUCTIONS  Ensure adequate fluid intake (hydration): have 1 cup (8 oz) of fluid for each diarrhea episode. Avoid fluids that contain simple sugars or sports drinks, fruit juices, whole milk products, and sodas. Your urine should be clear or pale yellow if you are drinking enough fluids. Hydrate with an oral rehydration solution that you can purchase at pharmacies, retail stores, and online. You can prepare an oral rehydration solution at home by mixing the following ingredients together:    tsp table salt.   tsp baking soda.   tsp salt substitute containing potassium chloride.  1  tablespoons sugar.  1 L (34 oz) of water.  Certain foods and beverages may increase the speed at which food moves through the  gastrointestinal (GI) tract. These foods and beverages should be avoided and include:  Caffeinated and alcoholic beverages.  High-fiber foods, such as raw fruits and vegetables, nuts, seeds, and whole grain breads and cereals.  Foods and beverages sweetened with sugar alcohols, such as xylitol, sorbitol, and mannitol.  Some foods may be well tolerated and may help thicken stool including:  Starchy foods, such as rice, toast, pasta, low-sugar cereal, oatmeal, grits, baked potatoes, crackers, and bagels.   Bananas.   Applesauce.  Add probiotic-rich foods to help increase healthy bacteria in the GI tract, such as yogurt and fermented milk products. RECOMMENDED FOODS AND BEVERAGES Starches Choose foods with less than 2 g of fiber per serving.  Recommended:  White, [place], and pita breads, plain rolls, buns, bagels. Plain muffins, matzo. Soda, saltine, or graham crackers. Pretzels, melba toast, zwieback. Cooked cereals made with water: cornmeal, farina, cream cereals. Dry cereals: refined corn, wheat, rice. Potatoes prepared any way without skins, refined macaroni, spaghetti, noodles, refined rice.  Avoid:  Bread, rolls, or crackers made with whole wheat, multi-grains, rye, bran seeds, nuts, or coconut. Corn tortillas or taco shells. Cereals containing whole grains, multi-grains, bran, coconut, nuts, raisins. Cooked or dry oatmeal. Coarse wheat cereals, granola. Cereals advertised as "high-fiber." Potato skins. Whole grain pasta, wild or brown rice. Popcorn. Sweet potatoes, yams. Sweet rolls, doughnuts, waffles,  pancakes, sweet breads. Vegetables  Recommended: Strained tomato and vegetable juices. Most well-cooked and canned vegetables without seeds. Fresh: Tender lettuce, cucumber without the skin, cabbage, spinach, bean sprouts.  Avoid: Fresh, cooked, or canned: Artichokes, baked beans, beet greens, broccoli, Brussels sprouts, corn, kale, legumes, peas, sweet potatoes. Cooked: Green or red  cabbage, spinach. Avoid large servings of any vegetables because vegetables shrink when cooked, and they contain more fiber per serving than fresh vegetables. Fruit  Recommended: Cooked or canned: Apricots, applesauce, cantaloupe, cherries, fruit cocktail, grapefruit, grapes, kiwi, mandarin oranges, peaches, pears, plums, watermelon. Fresh: Apples without skin, ripe banana, grapes, cantaloupe, cherries, grapefruit, peaches, oranges, plums. Keep servings limited to  cup or 1 piece.  Avoid: Fresh: Apples with skin, apricots, mangoes, pears, raspberries, strawberries. Prune juice, stewed or dried prunes. Dried fruits, raisins, dates. Large servings of all fresh fruits. Protein  Recommended: Ground or well-cooked tender beef, ham, veal, lamb, pork, or poultry. Eggs. Fish, oysters, shrimp, lobster, other seafoods. Liver, organ meats.  Avoid: Tough, fibrous meats with gristle. Peanut butter, smooth or chunky. Cheese, nuts, seeds, legumes, dried peas, beans, lentils. Dairy  Recommended: Yogurt, lactose-free milk, kefir, drinkable yogurt, buttermilk, soy milk, or plain hard cheese.  Avoid: Milk, chocolate milk, beverages made with milk, such as milkshakes. Soups  Recommended: Bouillon, broth, or soups made from allowed foods. Any strained soup.  Avoid: Soups made from vegetables that are not allowed, cream or milk-based soups. Desserts and Sweets  Recommended: Sugar-free gelatin, sugar-free frozen ice pops made without sugar alcohol.  Avoid: Plain cakes and cookies, pie made with fruit, pudding, custard, cream pie. Gelatin, fruit, ice, sherbet, frozen ice pops. Ice cream, ice milk without nuts. Plain hard candy, honey, jelly, molasses, syrup, sugar, chocolate syrup, gumdrops, marshmallows. Fats and Oils  Recommended: Limit fats to less than 8 tsp per day.  Avoid: Seeds, nuts, olives, avocados. Margarine, butter, cream, mayonnaise, salad oils, plain salad dressings. Plain gravy, crisp bacon  without rind. Beverages  Recommended: Water, decaffeinated teas, oral rehydration solutions, sugar-free beverages not sweetened with sugar alcohols.  Avoid: Fruit juices, caffeinated beverages (coffee, tea, soda), alcohol, sports drinks, or lemon-lime soda. Condiments  Recommended: Ketchup, mustard, horseradish, vinegar, cocoa powder. Spices in moderation: allspice, basil, bay leaves, celery powder or leaves, cinnamon, cumin powder, curry powder, ginger, mace, marjoram, onion or garlic powder, oregano, paprika, parsley flakes, ground pepper, rosemary, sage, savory, tarragon, thyme, turmeric.  Avoid: Coconut, honey. Document Released: 11/09/2003 Document Revised: 05/13/2012 Document Reviewed: 01/03/2012 [facility] Patient Information 2014 [place].

## 2013-11-30 NOTE — Discharge Summary (Signed)
Patient ID: [name] MRN: [id number] DOB/AGE: 1981/01/07 33 y.o.  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  Diarrhea subsided shortly after admission.  Patient given IV hydration overnight and rest encouraged.  Able to tolerate bland, soft PO diet in am.  Discharged to home with follow up appt scheduled.   Consults: None  Significant Diagnostic Studies: Cdiff & O/P testing-unable to obtain.  CBC, CMP, Lipase, and Amylase  Treatments: IV hydration and Imodium prn, zofran and robinul for nausea history  Discharge Exam: Blood pressure 103/48, pulse 72, temperature 97.5 F (36.4 C), temperature source Oral, resp. rate 18, height 5\' 4"  (1.626 m), weight 217 lb (98.431 kg), SpO2 100.00%. General appearance: alert, cooperative and no distress GI: soft, non-tender; bowel sounds normal; no masses,  no organomegaly Extremities: extremities normal, atraumatic, no cyanosis or edema and Homans sign is negative, no sign of DVT Flat affect, stable mood  Disposition: 01-Home or Self Care  Discharge Orders   Future Orders Complete By Expires   Discharge instructions  As directed    Comments:     [facility] Handout       Medication List         docusate sodium 100 MG capsule  Commonly known as:  COLACE  Take 200 mg by mouth daily.     glycopyrrolate 1 MG tablet  Commonly known as:  ROBINUL  Take 1 mg by mouth every 8 (eight) hours as needed (for spitting).     metoCLOPramide 10 MG tablet  Commonly known as:  REGLAN  Take 1 tablet (10 mg total) by mouth 4 (four) times daily -  before meals and at bedtime.     ondansetron 8 MG disintegrating tablet  Commonly known as:  ZOFRAN ODT  Take 1 tablet (8 mg total) by  mouth every 8 (eight) hours as needed for nausea or vomiting.     pantoprazole 40 MG tablet  Commonly known as:  PROTONIX  Take 1 tablet (40 mg total) by mouth daily.     promethazine 50 MG tablet  Commonly known as:  PHENERGAN  Take 25 mg by mouth daily as needed for nausea or vomiting.           Follow-up Information   Follow up with [facility] On 12/01/2013. (Call if you have any questions or concerns. )    Specialty:  Obstetrics and Gynecology   Contact information:   [address] [place] [address] [phone]      Signed: Sedalia Muta, CNM, MSN 11/30/2013, 6:10 PM

## 2013-12-28 ENCOUNTER — Encounter (HOSPITAL_COMMUNITY): Payer: Self-pay | Admitting: *Deleted

## 2013-12-28 ENCOUNTER — Inpatient Hospital Stay (HOSPITAL_COMMUNITY)
Admission: AD | Admit: 2013-12-28 | Discharge: 2013-12-28 | Disposition: A | Discharge status: Home / Self Care | Payer: 59 | Source: Ambulatory Visit | Attending: Obstetrics and Gynecology | Admitting: Obstetrics and Gynecology

## 2013-12-28 DIAGNOSIS — O99891 Other specified diseases and conditions complicating pregnancy: Secondary | ICD-10-CM | POA: Insufficient documentation

## 2013-12-28 DIAGNOSIS — J3489 Other specified disorders of nose and nasal sinuses: Secondary | ICD-10-CM | POA: Insufficient documentation

## 2013-12-28 DIAGNOSIS — Z91040 Latex allergy status: Secondary | ICD-10-CM | POA: Insufficient documentation

## 2013-12-28 DIAGNOSIS — O99810 Abnormal glucose complicating pregnancy: Secondary | ICD-10-CM | POA: Insufficient documentation

## 2013-12-28 DIAGNOSIS — O9989 Other specified diseases and conditions complicating pregnancy, childbirth and the puerperium: Principal | ICD-10-CM

## 2013-12-28 HISTORY — DX: Unspecified asthma, uncomplicated: J45.909

## 2013-12-28 MED ORDER — PSEUDOEPHEDRINE HCL 30 MG PO TABS
60.0000 mg | ORAL_TABLET | ORAL | Status: AC
  Administered 2013-12-28: 60 mg via ORAL
  Filled 2013-12-28: qty 2

## 2013-12-28 MED ORDER — OXYMETAZOLINE HCL 0.05 % NA SOLN
1.0000 | Freq: Every day | NASAL | Status: DC | PRN
  Administered 2013-12-28: 1 via NASAL
  Filled 2013-12-28: qty 15

## 2013-12-28 MED ORDER — IBUPROFEN 600 MG PO TABS: 600.0000 mg | ORAL_TABLET | Freq: Four times a day (QID) | ORAL | Status: DC | PRN

## 2013-12-28 MED ORDER — PSEUDOEPHEDRINE HCL 60 MG PO TABS: 60.0000 mg | ORAL_TABLET | Freq: Four times a day (QID) | ORAL | Status: DC | PRN

## 2013-12-28 NOTE — [facility] Provider Note (Signed)
History   33 yo G3P2002 at 20 5/7 weeks presented after calling office c/o severe nasal congestion, inability to breath through nose, and severe sinus pressure x several days.  Denies cough, sore throat, fever, viral exposure.  Hx childhood asthma, but no recent issues.  Denies chest congestion, SOB, N/V, diarrhea.  Has tried Robitussin, Mucinex, saline nasal spray, and Flonase without benefit.  Reports fasting CBGs have been "usually less than 100", and 2 hours PCs all < 120.  Patient Active Problem List   Diagnosis Date Noted  . Gestational diabetes 11/29/2013  . 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 Nausea and vomiting in pregnancy 09/27/2013  . Ptyalism 09/27/2013    No chief complaint on file.  HPI:  See above  OB History   Grav Para Term Preterm Abortions TAB SAB Ect Mult Living   3 2 2  0 0 0 0 0 0 2      Past Medical History  Diagnosis Date  . Urinary tract infection   . Trichomonas 02/2011    treated  . Sinusitis   . Gestational diabetes   . Abnormal Pap smear   . H/O varicella   . GDM (gestational diabetes mellitus)   . Hyperemesis     During first pregnancy  . Hx of bacterial infection   . Asthma     Past Surgical History  Procedure Laterality Date  . No past surgeries      Family History  Problem Relation Age of Onset  . Diabetes Mother   . Hypertension Mother   . Peripheral vascular disease Mother   . Thyroid disease Mother   . Asthma Mother   . Diabetes Father   . Hypertension Father   . Anesthesia problems Neg Hx   . Hypotension Neg Hx   . Malignant hyperthermia Neg Hx   . Pseudochol deficiency Neg Hx   . Seizures Maternal Aunt   . Alcohol abuse Maternal Aunt   . Heart attack Maternal Uncle   . Kidney disease Maternal Uncle   . Cancer Maternal Uncle     prostate  . Thyroid disease Paternal Aunt   . Heart attack Paternal Uncle   . Kidney disease Paternal Uncle     History  Substance Use  Topics  . Smoking status: Never Smoker   . Smokeless tobacco: Never Used  . Alcohol Use: No    Allergies:  Allergies  Allergen Reactions  . Flagyl [Metronidazole Hcl] Swelling  . Latex Hives  . Other Other (See Comments)    Pt has a reaction to plastic tape. She breaks out with a rash and her skin peels.    Prescriptions prior to admission  Medication Sig Dispense Refill  . docusate sodium (COLACE) 100 MG capsule Take 200 mg by mouth daily.       Marland Kitchen glycopyrrolate (ROBINUL) 1 MG tablet Take 1 mg by mouth every 8 (eight) hours as needed (for spitting).       . metoCLOPramide (REGLAN) 10 MG tablet Take 1 tablet (10 mg total) by mouth 4 (four) times daily -  before meals and at bedtime.  120 tablet  0  . ondansetron (ZOFRAN ODT) 8 MG disintegrating tablet Take 1 tablet (8 mg total) by mouth every 8 (eight) hours as needed for nausea or vomiting.  20 tablet  2  . pantoprazole (PROTONIX) 40 MG tablet Take 1 tablet (40 mg total) by mouth daily.  30 tablet  4  .  promethazine (PHENERGAN) 50 MG tablet Take 25 mg by mouth daily as needed for nausea or vomiting.        ROS:  Severe nasal congestion, right ear pressure, sinus pressure. Physical Exam   Blood pressure 107/56, pulse 78, temperature 98.3 F (36.8 C), temperature source Oral, resp. rate 20, SpO2 99.00%.  Physical Exam Throat clear Ears--right ear drum full and erythematous, left ear drum/canal clear Nasal turbinates erythematous and boggy Sinuses tender to touch No lymphdenopathy noted Chest clear--no adventitious sounds, no wheezing, rales or rhonchi Heart RRR without murmur Abd gravid, NT Pelvic--deferred Ext WNL  FHR 160 by cardio  ED Course  IUP at 20 5/7 weeks Severe nasal congestion GDM--diet-controlled.  Plan: Sudafed 60 mg po q 6 hours prn. Afrin for very limited use in next 24 hours as initial measure to help relieve severe nasal congestion. Continue saline nasal spray several times/day. May use  Ibuprophen 600 mg po q 6 hours prn for sinus pain/inflammation. Heat to sinuses, Vicks Vapo Rub under nose, etc. Continue to monitor FBS and 2 hour PCs--bring record book to next visit on 01/10/14.  Reminded to follow dietary guidelines for GDM.   [name] CNM, MSN 12/28/2013 2:14 PM

## 2013-12-28 NOTE — Discharge Instructions (Signed)
Sinusitis Sinusitis is redness, soreness, and swelling (inflammation) of the paranasal sinuses. Paranasal sinuses are air pockets within the bones of your face (beneath the eyes, the middle of the forehead, or above the eyes). In healthy paranasal sinuses, mucus is able to drain out, and air is able to circulate through them by way of your nose. However, when your paranasal sinuses are inflamed, mucus and air can become trapped. This can allow bacteria and other germs to grow and cause infection. Sinusitis can develop quickly and last only a short time (acute) or continue over a long period (chronic). Sinusitis that lasts for more than 12 weeks is considered chronic.  CAUSES  Causes of sinusitis include:  Allergies.  Structural abnormalities, such as displacement of the cartilage that separates your nostrils (deviated septum), which can decrease the air flow through your nose and sinuses and affect sinus drainage.  Functional abnormalities, such as when the small hairs (cilia) that line your sinuses and help remove mucus do not work properly or are not present. SYMPTOMS  Symptoms of acute and chronic sinusitis are the same. The primary symptoms are pain and pressure around the affected sinuses. Other symptoms include:  Upper toothache.  Earache.  Headache.  Bad breath.  Decreased sense of smell and taste.  A cough, which worsens when you are lying flat.  Fatigue.  Fever.  Thick drainage from your nose, which often is green and may contain pus (purulent).  Swelling and warmth over the affected sinuses. DIAGNOSIS  Your caregiver will perform a physical exam. During the exam, your caregiver may:  Look in your nose for signs of abnormal growths in your nostrils (nasal polyps).  Tap over the affected sinus to check for signs of infection.  View the inside of your sinuses (endoscopy) with a special imaging device with a light attached (endoscope), which is inserted into your  sinuses. If your caregiver suspects that you have chronic sinusitis, one or more of the following tests may be recommended:  Allergy tests.  Nasal culture A sample of mucus is taken from your nose and sent to a lab and screened for bacteria.  Nasal cytology A sample of mucus is taken from your nose and examined by your caregiver to determine if your sinusitis is related to an allergy. TREATMENT  Most cases of acute sinusitis are related to a viral infection and will resolve on their own within 10 days. Sometimes medicines are prescribed to help relieve symptoms (pain medicine, decongestants, nasal steroid sprays, or saline sprays).  However, for sinusitis related to a bacterial infection, your caregiver will prescribe antibiotic medicines. These are medicines that will help kill the bacteria causing the infection.  Rarely, sinusitis is caused by a fungal infection. In theses cases, your caregiver will prescribe antifungal medicine. For some cases of chronic sinusitis, surgery is needed. Generally, these are cases in which sinusitis recurs more than 3 times per year, despite other treatments. HOME CARE INSTRUCTIONS   Drink plenty of water. Water helps thin the mucus so your sinuses can drain more easily.  Use a humidifier.  Inhale steam 3 to 4 times a day (for example, sit in the bathroom with the shower running).  Apply a warm, moist washcloth to your face 3 to 4 times a day, or as directed by your caregiver.  Use saline nasal sprays to help moisten and clean your sinuses.  Take over-the-counter or prescription medicines for pain, discomfort, or fever only as directed by your caregiver. SEEK IMMEDIATE  MEDICAL CARE IF:  You have increasing pain or severe headaches.  You have nausea, vomiting, or drowsiness.  You have swelling around your face.  You have vision problems.  You have a stiff neck.  You have difficulty breathing. MAKE SURE YOU:   Understand these  instructions.  Will watch your condition.  Will get help right away if you are not doing well or get worse. Document Released: 08/19/2005 Document Revised: 11/11/2011 Document Reviewed: 09/03/2011 [facility] Patient Information 2014 [place], [place]. Oxymetazoline nasal spray What is this medicine? Oxymetazoline (OX ee me TAZ oh leen) is a nasal decongestant. This medicine is used to treat nasal congestion or a stuffy nose. This medicine will not treat an infection. This medicine may be used for other purposes; ask your health care provider or pharmacist if you have questions. COMMON BRAND NAME(S): 12 Hour Nasal , Afrin Extra Moisturizing, Afrin Nasal Sinus, Afrin, Dristan, Duration, Genasal , Mucinex Full Force, Mucinex Moisture Smart, Mucinex Sinus-Max, Nasal Relief , Neo-Synephrine 12-Hour, Neo-Synephrine Severe Sinus Congestion, Sinex 12-Hour, Sudafed OM Sinus Cold Moisturizing, Sudafed OM Sinus Congestion Moisturizing, Vicks Sinex, Zicam Extreme Congestion Relief, Zicam Intense Sinus What should I tell my health care provider before I take this medicine? They need to know if you have any of these conditions: -diabetes -heart disease -high blood pressure -thyroid disease -trouble urinating due to an enlarged prostate gland -an unusual or allergic reaction to oxymetazoline, other medicines, foods, dyes, or preservatives -pregnant or trying to get pregnant -breast-feeding How should I use this medicine? This medicine is for use in the nose. Do not take by mouth. Follow the directions on the package label. Shake well before using. Use your medicine at regular intervals or as directed by your health care provider. Do not use it more often than directed. Do not use for more than 3 days in a row without advice. Make sure that you are using your nasal spray correctly. Ask your doctor or health care provider if you have any questions. Talk to your pediatrician regarding the use of this medicine in  children. While this drug may be prescribed for children for selected conditions, precautions do apply. Overdosage: If you think you've taken too much of this medicine contact a poison control center or emergency room at once. Overdosage: If you think you have taken too much of this medicine contact a poison control center or emergency room at once. NOTE: This medicine is only for you. Do not share this medicine with others. What if I miss a dose? If you miss a dose, use it as soon as you can. If it is almost time for your next dose, use only that dose. Do not use double or extra doses. What may interact with this medicine? Do not take this medicine with any of the following medications: -MAOIs like Marplan, Nardil, and Parnate This list may not describe all possible interactions. Give your health care provider a list of all the medicines, herbs, non-prescription drugs, or dietary supplements you use. Also tell them if you smoke, drink alcohol, or use illegal drugs. Some items may interact with your medicine. What should I watch for while using this medicine? Tell your doctor or healthcare professional if your symptoms do not start to get better or if they get worse. Do not share this bottle with anyone else as this may spread germs. What side effects may I notice from receiving this medicine? Side effects that you should report to your doctor or health care professional  as soon as possible: -allergic reactions like skin rash, itching or hives, swelling of the face, lips, or tongue  Side effects that usually do not require medical attention (Report these to your doctor or health care professional if they continue or are bothersome.): -burning, stinging, or irritation in the nose right after use -increased nasal discharge -sneezing This list may not describe all possible side effects. Call your doctor for medical advice about side effects. You may report side effects to FDA at  1-800-FDA-1088. Where should I keep my medicine? Keep out of the reach of children. Store at room temperature between 20 and 25 degrees C (68 and 77 degrees F). Throw away any unused medicine after the expiration date. NOTE: This sheet is a summary. It may not cover all possible information. If you have questions about this medicine, talk to your doctor, pharmacist, or health care provider.  2014, [employer] Standard. (2011-03-20 14:11:31)

## 2013-12-28 NOTE — MAU Note (Signed)
Been taking robitussin, went to dr yesterday, told them she can't breath- was told to take robitussin- can't breath through nose, can't sleep.  Has not used inhaler.( voice is raspy), feels it all in her face, sinus congestion and pressure.

## 2014-01-02 ENCOUNTER — Other Ambulatory Visit: Payer: Self-pay

## 2014-01-12 ENCOUNTER — Inpatient Hospital Stay (HOSPITAL_COMMUNITY)
Admission: AD | Admit: 2014-01-12 | Discharge: 2014-01-12 | Disposition: A | Discharge status: Home / Self Care | Payer: Medicaid Other | Source: Ambulatory Visit | Attending: Obstetrics and Gynecology | Admitting: Obstetrics and Gynecology

## 2014-01-12 ENCOUNTER — Encounter (HOSPITAL_COMMUNITY): Payer: Self-pay | Admitting: *Deleted

## 2014-01-12 DIAGNOSIS — K117 Disturbances of salivary secretion: Secondary | ICD-10-CM | POA: Insufficient documentation

## 2014-01-12 DIAGNOSIS — O99340 Other mental disorders complicating pregnancy, unspecified trimester: Secondary | ICD-10-CM | POA: Diagnosis not present

## 2014-01-12 DIAGNOSIS — Z91040 Latex allergy status: Secondary | ICD-10-CM | POA: Insufficient documentation

## 2014-01-12 DIAGNOSIS — F411 Generalized anxiety disorder: Secondary | ICD-10-CM | POA: Diagnosis not present

## 2014-01-12 DIAGNOSIS — O99810 Abnormal glucose complicating pregnancy: Secondary | ICD-10-CM | POA: Insufficient documentation

## 2014-01-12 DIAGNOSIS — O9989 Other specified diseases and conditions complicating pregnancy, childbirth and the puerperium: Secondary | ICD-10-CM | POA: Insufficient documentation

## 2014-01-12 HISTORY — DX: Depression, unspecified: F32.A

## 2014-01-12 HISTORY — DX: Major depressive disorder, single episode, unspecified: F32.9

## 2014-01-12 LAB — URINALYSIS, ROUTINE W REFLEX MICROSCOPIC
BILIRUBIN URINE: NEGATIVE
GLUCOSE, UA: NEGATIVE mg/dL
Hgb urine dipstick: NEGATIVE
KETONES UR: NEGATIVE mg/dL
Leukocytes, UA: NEGATIVE
Nitrite: NEGATIVE
PH: 6 (ref 5.0–8.0)
Protein, ur: NEGATIVE mg/dL
Specific Gravity, Urine: >1.03 — ABNORMAL HIGH (ref 1.005–1.030)
Urobilinogen, UA: 0.2 mg/dL (ref 0.0–1.0)

## 2014-01-12 NOTE — Progress Notes (Signed)
Patient does not want to stay for consult. Irena Reichmann, CNM discussed with patient that the office will make her an out patient appointment for [facility] consult. Patient states she is OK with that plan.

## 2014-01-12 NOTE — MAU Provider Note (Signed)
History     Patient Active Problem List   Diagnosis Date Noted  . Gestational diabetes 11/29/2013  . 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 Nausea and vomiting in pregnancy 09/27/2013  . Ptyalism 09/27/2013    Chief Complaint  Patient presents with  . Headache   HPI 33 yo G3P2002 16 6/7wks presents with multiple subjective complaints since office visit on Monday. Patient was started on Zoloft for anxiety and referred to [facility] for consultation. Patient was seen for initial evaluation at [facility] and contacted office with complaints. Declined office appt and came to MAU for evaluation.  Reports multiple familial stressors. Denies suicidal and homicidal ideations. +FM, no LOF, no VB or UCTXs.    OB History   Grav Para Term Preterm Abortions TAB SAB Ect Mult Living   3 2 2  0 0 0 0 0 0 2      Past Medical History  Diagnosis Date  . Urinary tract infection   . Trichomonas 02/2011    treated  . Sinusitis   . Gestational diabetes   . Abnormal Pap smear   . H/O varicella   . GDM (gestational diabetes mellitus)   . Hyperemesis     During first pregnancy  . Hx of bacterial infection   . Asthma   . Depression     Past Surgical History  Procedure Laterality Date  . No past surgeries      Family History  Problem Relation Age of Onset  . Diabetes Mother   . Hypertension Mother   . Peripheral vascular disease Mother   . Thyroid disease Mother   . Asthma Mother   . Diabetes Father   . Hypertension Father   . Anesthesia problems Neg Hx   . Hypotension Neg Hx   . Malignant hyperthermia Neg Hx   . Pseudochol deficiency Neg Hx   . Seizures Maternal Aunt   . Alcohol abuse Maternal Aunt   . Heart attack Maternal Uncle   . Kidney disease Maternal Uncle   . Cancer Maternal Uncle     prostate  . Thyroid disease Paternal Aunt   . Heart attack Paternal Uncle   . Kidney disease Paternal Uncle     History  Substance Use  Topics  . Smoking status: Never Smoker   . Smokeless tobacco: Never Used  . Alcohol Use: No    Allergies:  Allergies  Allergen Reactions  . Flagyl [Metronidazole Hcl] Swelling  . Latex Hives  . Other Other (See Comments)    Pt has a reaction to plastic tape. She breaks out with a rash and her skin peels.    No prescriptions prior to admission    ROS +SOB No chest pain No SI/HI   Physical Exam   Blood pressure 112/83, pulse 93, temperature 98.5 F (36.9 C), temperature source Oral, resp. rate 18, SpO2 100.00%.  Physical Exam Gen: NAD, anxious, tearful with questioning Lungs: CTA bilat Heart: RRR, no murmurs Abd: gravid Cvx: Closed/long/thick  FHT's: Present Toco: No CTXs  ED Course  Assessment: Anxiety with multiple stressors. Receiving counseling and recently started on meds.Offered ACT consult and patient declined.   Plan: - Continue Zoloft - Outpatient Behavioral Health consultation - Routine OB f/u    Farrel Gordon CNM, MSN 01/12/2014 11:19 PM

## 2014-01-12 NOTE — Discharge Instructions (Signed)
Keep your scheduled appointment for prenatal care. The office will schedule you for an out patient appointment with behavioral health to discuss your medications and any further treatment.

## 2014-01-12 NOTE — BH Assessment (Signed)
TTS spoke with nurse Erasmo Score who reports that patient has been discharged as she did not want to wait for [facility] consult.  Shaune Pollack, MS, [place] Assessment Counselor

## 2014-01-12 NOTE — MAU Note (Signed)
Patient states she woke up with a headache at 1000 this am, states she has not taken anything for pain. States she feels weak. Denies abdominal pain, bleeding or leaking, nausea or vomiting. States she started Zoloft on Monday. States she has felt the baby move.

## 2014-02-20 ENCOUNTER — Inpatient Hospital Stay (HOSPITAL_COMMUNITY)
Admission: AD | Admit: 2014-02-20 | Discharge: 2014-02-20 | Disposition: A | Discharge status: Home / Self Care | Payer: Medicaid Other | Source: Ambulatory Visit | Attending: Obstetrics and Gynecology | Admitting: Obstetrics and Gynecology

## 2014-02-20 ENCOUNTER — Encounter (HOSPITAL_COMMUNITY): Payer: Self-pay | Admitting: *Deleted

## 2014-02-20 DIAGNOSIS — O4700 False labor before 37 completed weeks of gestation, unspecified trimester: Secondary | ICD-10-CM | POA: Diagnosis not present

## 2014-02-20 DIAGNOSIS — Z91040 Latex allergy status: Secondary | ICD-10-CM | POA: Insufficient documentation

## 2014-02-20 DIAGNOSIS — F32A Depression, unspecified: Secondary | ICD-10-CM

## 2014-02-20 DIAGNOSIS — F3289 Other specified depressive episodes: Secondary | ICD-10-CM | POA: Insufficient documentation

## 2014-02-20 DIAGNOSIS — F329 Major depressive disorder, single episode, unspecified: Secondary | ICD-10-CM | POA: Insufficient documentation

## 2014-02-20 DIAGNOSIS — O2650 Maternal hypotension syndrome, unspecified trimester: Secondary | ICD-10-CM | POA: Insufficient documentation

## 2014-02-20 DIAGNOSIS — O99810 Abnormal glucose complicating pregnancy: Secondary | ICD-10-CM | POA: Diagnosis not present

## 2014-02-20 DIAGNOSIS — Z833 Family history of diabetes mellitus: Secondary | ICD-10-CM | POA: Diagnosis not present

## 2014-02-20 DIAGNOSIS — O99340 Other mental disorders complicating pregnancy, unspecified trimester: Secondary | ICD-10-CM | POA: Diagnosis not present

## 2014-02-20 LAB — COMPREHENSIVE METABOLIC PANEL
ALBUMIN: 3 g/dL — AB (ref 3.5–5.2)
ALT: 7 U/L (ref 0–35)
AST: 10 U/L (ref 0–37)
Alkaline Phosphatase: 55 U/L (ref 39–117)
BUN: 6 mg/dL (ref 6–23)
CO2: 22 mEq/L (ref 19–32)
Calcium: 8.7 mg/dL (ref 8.4–10.5)
Chloride: 102 mEq/L (ref 96–112)
Creatinine, Ser: 0.51 mg/dL (ref 0.50–1.10)
GFR calc non Af Amer: >90 mL/min (ref >90)
GLUCOSE: 74 mg/dL (ref 70–99)
Potassium: 3.9 mEq/L (ref 3.7–5.3)
Sodium: 136 mEq/L — ABNORMAL LOW (ref 137–147)
Total Protein: 6.3 g/dL (ref 6.0–8.3)

## 2014-02-20 LAB — CBC WITH DIFFERENTIAL/PLATELET
BASOS PCT: 0 % (ref 0–1)
Basophils Absolute: 0 10*3/uL (ref 0.0–0.1)
EOS ABS: 0.1 10*3/uL (ref 0.0–0.7)
Eosinophils Relative: 1 % (ref 0–5)
HEMATOCRIT: 31.8 % — AB (ref 36.0–46.0)
HEMOGLOBIN: 11.2 g/dL — AB (ref 12.0–15.0)
Lymphocytes Relative: 27 % (ref 12–46)
Lymphs Abs: 1.7 10*3/uL (ref 0.7–4.0)
MCH: 30.2 pg (ref 26.0–34.0)
MCHC: 35.2 g/dL (ref 30.0–36.0)
MCV: 85.7 fL (ref 78.0–100.0)
MONOS PCT: 8 % (ref 3–12)
Monocytes Absolute: 0.5 10*3/uL (ref 0.1–1.0)
Neutro Abs: 3.9 10*3/uL (ref 1.7–7.7)
Neutrophils Relative %: 64 % (ref 43–77)
Platelets: 199 10*3/uL (ref 150–400)
RBC: 3.71 MIL/uL — ABNORMAL LOW (ref 3.87–5.11)
RDW: 11.9 % (ref 11.5–15.5)
WBC: 6.2 10*3/uL (ref 4.0–10.5)

## 2014-02-20 LAB — PROTEIN / CREATININE RATIO, URINE
CREATININE, URINE: 123.61 mg/dL
Protein Creatinine Ratio: 0.1 (ref 0.00–0.15)
Total Protein, Urine: 12.5 mg/dL

## 2014-02-20 LAB — LACTATE DEHYDROGENASE: LDH: 149 U/L (ref 94–250)

## 2014-02-20 LAB — URINALYSIS, ROUTINE W REFLEX MICROSCOPIC
Bilirubin Urine: NEGATIVE
GLUCOSE, UA: 500 mg/dL — AB
Hgb urine dipstick: NEGATIVE
Ketones, ur: NEGATIVE mg/dL
LEUKOCYTES UA: NEGATIVE
NITRITE: NEGATIVE
PROTEIN: NEGATIVE mg/dL
Specific Gravity, Urine: 1.02 (ref 1.005–1.030)
UROBILINOGEN UA: 1 mg/dL (ref 0.0–1.0)
pH: 6.5 (ref 5.0–8.0)

## 2014-02-20 LAB — URIC ACID: Uric Acid, Serum: 3.6 mg/dL (ref 2.4–7.0)

## 2014-02-20 MED ORDER — LACTATED RINGERS IV SOLN
INTRAVENOUS | Status: DC
  Administered 2014-02-20: 18:00:00 via INTRAVENOUS

## 2014-02-20 MED ORDER — METOCLOPRAMIDE HCL 10 MG PO TABS
10.0000 mg | ORAL_TABLET | Freq: Once | ORAL | Status: AC
  Administered 2014-02-20: 10 mg via ORAL
  Filled 2014-02-20: qty 1

## 2014-02-20 MED ORDER — LACTATED RINGERS IV BOLUS (SEPSIS)
500.0000 mL | Freq: Once | INTRAVENOUS | Status: AC
  Administered 2014-02-20: 17:00:00 via INTRAVENOUS

## 2014-02-20 MED ORDER — ACETAMINOPHEN 325 MG PO TABS
650.0000 mg | ORAL_TABLET | Freq: Once | ORAL | Status: AC
  Administered 2014-02-20: 650 mg via ORAL
  Filled 2014-02-20: qty 2

## 2014-02-20 NOTE — MAU Provider Note (Signed)
History   33 yo G3P2002 at 54 3/7 weeks presented unannounced via EMS after near-syncopal episode while at son's baseball game.  Upon EMS assessment, she was noted to have BP 140/100.  When reclined in the EMS truck, patient's BP was 110/60.  She was still "fainty" when they arrived, but is feeling better now.  Had 1 cup apple juice this afternoon, ate chicken and eggs around 1pm.  Patient felt she got too hot in the sun.  Patient Active Problem List   Diagnosis Date Noted  . Depression--has been referred to counseling, on Zoloft. 02/20/2014  . Gestational diabetes 11/29/2013  . 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 Nausea and vomiting in pregnancy 09/27/2013  . Ptyalism 09/27/2013   Dx wiith presumptive diabetes, due to hx of GDM, diet controlled in previous pregnancy, with Hgb A1C 6.1 at NOB and elevated early glucola.  Reports has not been checking CBGs.  Reports depression stable--on Zoloft 50 mg daily  Also on Reglan, Robinul, Zofran, and Protonix at home.  HPI:  See above.  OB History   Grav Para Term Preterm Abortions TAB SAB Ect Mult Living   3 2 2  0 0 0 0 0 0 2      Past Medical History  Diagnosis Date  . Urinary tract infection   . Trichomonas 02/2011    treated  . Sinusitis   . Gestational diabetes   . Abnormal Pap smear   . H/O varicella   . GDM (gestational diabetes mellitus)   . Hyperemesis     During first pregnancy  . Hx of bacterial infection   . Asthma   . Depression     Past Surgical History  Procedure Laterality Date  . No past surgeries      Family History  Problem Relation Age of Onset  . Diabetes Mother   . Hypertension Mother   . Peripheral vascular disease Mother   . Thyroid disease Mother   . Asthma Mother   . Diabetes Father   . Hypertension Father   . Anesthesia problems Neg Hx   . Hypotension Neg Hx   . Malignant hyperthermia Neg Hx   . Pseudochol deficiency Neg Hx   . Seizures  Maternal Aunt   . Alcohol abuse Maternal Aunt   . Heart attack Maternal Uncle   . Kidney disease Maternal Uncle   . Cancer Maternal Uncle     prostate  . Thyroid disease Paternal Aunt   . Heart attack Paternal Uncle   . Kidney disease Paternal Uncle     History  Substance Use Topics  . Smoking status: Never Smoker   . Smokeless tobacco: Never Used  . Alcohol Use: No    Allergies:  Allergies  Allergen Reactions  . Flagyl [Metronidazole Hcl] Swelling  . Latex Hives  . Other Other (See Comments)    Pt has a reaction to plastic tape. She breaks out with a rash and her skin peels.    Prescriptions prior to admission  Medication Sig Dispense Refill  . docusate sodium (COLACE) 100 MG capsule Take 200 mg by mouth daily as needed for mild constipation.       Marland Kitchen glycopyrrolate (ROBINUL) 1 MG tablet Take 1 mg by mouth every 8 (eight) hours as needed (for spitting).       . metoCLOPramide (REGLAN) 10 MG tablet Take 1 tablet (10 mg total) by mouth 4 (four) times daily -  before meals and at  bedtime.  120 tablet  0  . ondansetron (ZOFRAN ODT) 8 MG disintegrating tablet Take 1 tablet (8 mg total) by mouth every 8 (eight) hours as needed for nausea or vomiting.  20 tablet  2  . pantoprazole (PROTONIX) 40 MG tablet Take 1 tablet (40 mg total) by mouth daily.  30 tablet  4  . sertraline (ZOLOFT) 50 MG tablet Take 100 mg by mouth daily.        ROS:  Fatigue, dizziness prior to admission. Physical Exam   Blood pressure 106/62, pulse 90, temperature 98 F (36.7 C), temperature source Oral, resp. rate 16, height 5\' 3"  (1.6 m), weight 229 lb (103.874 kg).  Physical Exam In NAD   Chest clear Heart RRR without murmur Abd gravid, NT Pelvic--deferred Ext WNL  FHR Category 1 UCs minimal irritability  ED Course  Assessment: IUP at 28 3/7 weeks Near-syncopal episode Gestational diabetes Depression Elevated BP with episode  Plan: IV hydration (IV already in place from EMS) [facility] PCR   LATHAM, [place], [place] 02/20/2014 5:30 PM  Addendum: Feeling fine now.  Ate light meal, tolerated without difficulty. Husband at bedside--advised me patient had been standing in hot sun for "awhile" at son's ballgame prior to near-syncope episode.  Filed Vitals:   02/20/14 1708 02/20/14 1730 02/20/14 1840 02/20/14 1904  BP: 106/62 110/61 111/59 107/63  Pulse: 90  85   Temp: 98 F (36.7 C)     TempSrc: Oral     Resp: 16     Height: 5\' 3"  (1.6 m)     Weight: 229 lb (103.874 kg)      Results for orders placed during the hospital encounter of 02/20/14 (from the past 24 hour(s))  URINALYSIS, ROUTINE W REFLEX MICROSCOPIC     Status: Abnormal   Collection Time    02/20/14  5:09 PM      Result Value Ref Range   Color, Urine YELLOW  YELLOW   APPearance CLEAR  CLEAR   Specific Gravity, Urine 1.020  1.005 - 1.030   pH 6.5  5.0 - 8.0   Glucose, UA 500 (*) NEGATIVE mg/dL   Hgb urine dipstick NEGATIVE  NEGATIVE   Bilirubin Urine NEGATIVE  NEGATIVE   Ketones, ur NEGATIVE  NEGATIVE mg/dL   Protein, ur NEGATIVE  NEGATIVE mg/dL   Urobilinogen, UA 1.0  0.0 - 1.0 mg/dL   Nitrite NEGATIVE  NEGATIVE   Leukocytes, UA NEGATIVE  NEGATIVE  PROTEIN / CREATININE RATIO, URINE     Status: None   Collection Time    02/20/14  5:09 PM      Result Value Ref Range   Creatinine, Urine 123.61     Total Protein, Urine 12.5     PROTEIN CREATININE RATIO 0.10  0.00 - 0.15  CBC WITH DIFFERENTIAL     Status: Abnormal   Collection Time    02/20/14  5:11 PM      Result Value Ref Range   WBC 6.2  4.0 - 10.5 K/uL   RBC 3.71 (*) 3.87 - 5.11 MIL/uL   Hemoglobin 11.2 (*) 12.0 - 15.0 g/dL   HCT 31.8 (*) 36.0 - 46.0 %   MCV 85.7  78.0 - 100.0 fL   MCH 30.2  26.0 - 34.0 pg   MCHC 35.2  30.0 - 36.0 g/dL   RDW 11.9  11.5 - 15.5 %   Platelets 199  150 - 400 K/uL   Neutrophils Relative % 64  43 - 77 %  Neutro Abs 3.9  1.7 - 7.7 K/uL   Lymphocytes Relative 27  12 - 46 %   Lymphs Abs 1.7  0.7 - 4.0 K/uL    Monocytes Relative 8  3 - 12 %   Monocytes Absolute 0.5  0.1 - 1.0 K/uL   Eosinophils Relative 1  0 - 5 %   Eosinophils Absolute 0.1  0.0 - 0.7 K/uL   Basophils Relative 0  0 - 1 %   Basophils Absolute 0.0  0.0 - 0.1 K/uL  COMPREHENSIVE METABOLIC PANEL     Status: Abnormal   Collection Time    02/20/14  5:11 PM      Result Value Ref Range   Sodium 136 (*) 137 - 147 mEq/L   Potassium 3.9  3.7 - 5.3 mEq/L   Chloride 102  96 - 112 mEq/L   CO2 22  19 - 32 mEq/L   Glucose, Bld 74  70 - 99 mg/dL   BUN 6  6 - 23 mg/dL   Creatinine, Ser 0.51  0.50 - 1.10 mg/dL   Calcium 8.7  8.4 - 10.5 mg/dL   Total Protein 6.3  6.0 - 8.3 g/dL   Albumin 3.0 (*) 3.5 - 5.2 g/dL   AST 10  0 - 37 U/L   ALT 7  0 - 35 U/L   Alkaline Phosphatase 55  39 - 117 U/L   Total Bilirubin <0.2 (*) 0.3 - 1.2 mg/dL   GFR calc non Af Amer >90  >90 mL/min   GFR calc Af Amer >90  >90 mL/min  LACTATE DEHYDROGENASE     Status: None   Collection Time    02/20/14  5:11 PM      Result Value Ref Range   LDH 149  94 - 250 U/L  URIC ACID     Status: None   Collection Time    02/20/14  5:11 PM      Result Value Ref Range   Uric Acid, Serum 3.6  2.4 - 7.0 mg/dL   FHR Category 1 Very occasional mild irritability.  Impression: IUP at 28 3/7 weeks Near-syncopal episode, likely due to heat exposure, hypotension from standing, ? Low blood sugar. Poor monitoring of CBGs  Plan: D/C home with instructions to check CBGs as directed (FBS and 2 hour PCs).  If she can't do daily, do at least 3x/week for generation of some data for our review. Reviewed importance of monitoring glucose levels for mother and fetal well-being. To increase fluid intake, protein snacks, avoid prolonged standing and heat exposure. Keep scheduled appt at [facility] for upcoming week, or prn.  Donnel Saxon, CNM 02/20/14 7:15p

## 2014-02-20 NOTE — MAU Note (Signed)
Pt was at sons ballgame, and became hot, dizzy, SOB and c/o headache above her eyes.  Felt like she was going to faint but did not.  Came in via EMS. Denies any vag bleeding or leaking. Reports good fetal movement.

## 2014-02-20 NOTE — Discharge Instructions (Signed)
Near-Syncope Near-syncope (commonly known as near fainting) is sudden weakness, dizziness, or feeling like you might pass out. During an episode of near-syncope, you may also develop pale skin, have tunnel vision, or feel sick to your stomach (nauseous). Near-syncope may occur when getting up after sitting or while standing for a long time. It is caused by a sudden decrease in blood flow to the brain. This decrease can result from various causes or triggers, most of which are not serious. However, because near-syncope can sometimes be a sign of something serious, a medical evaluation is required. The specific cause is often not determined. HOME CARE INSTRUCTIONS  Monitor your condition for any changes. The following actions may help to alleviate any discomfort you are experiencing:  Have someone stay with you until you feel stable.  Lie down right away and prop your feet up if you start feeling like you might faint. Breathe deeply and steadily. Wait until all the symptoms have passed. Most of these episodes last only a few minutes. You may feel tired for several hours.   Drink enough fluids to keep your urine clear or pale yellow.   If you are taking blood pressure or heart medicine, get up slowly when seated or lying down. Take several minutes to sit and then stand. This can reduce dizziness.  Follow up with your health care provider as directed.  Monitor your blood sugar as directed.  Eat regular high protein snacks.  Avoid heat exposure.  Avoid prolonged standing. SEEK IMMEDIATE MEDICAL CARE IF:   You have a severe headache.   You have unusual pain in the chest, abdomen, or back.   You are bleeding from the mouth or rectum, or you have black or tarry stool.   You have an irregular or very fast heartbeat.   You have repeated fainting or have seizure-like jerking during an episode.   You faint when sitting or lying down.   You have confusion.   You have difficulty  walking.   You have severe weakness.   You have vision problems.  MAKE SURE YOU:   Understand these instructions.  Will watch your condition.  Will get help right away if you are not doing well or get worse. Document Released: 08/19/2005 Document Revised: 08/24/2013 Document Reviewed: 01/22/2013 [facility] Patient Information 2015 [place], [place]. This information is not intended to replace advice given to you by your health care provider. Make sure you discuss any questions you have with your health care provider.

## 2014-03-11 ENCOUNTER — Inpatient Hospital Stay (HOSPITAL_COMMUNITY)
Admission: AD | Admit: 2014-03-11 | Discharge: 2014-03-11 | Disposition: A | Discharge status: Home / Self Care | Payer: 59 | Source: Ambulatory Visit | Attending: Obstetrics and Gynecology | Admitting: Obstetrics and Gynecology

## 2014-03-11 ENCOUNTER — Encounter (HOSPITAL_COMMUNITY): Payer: Self-pay | Admitting: *Deleted

## 2014-03-11 DIAGNOSIS — R109 Unspecified abdominal pain: Secondary | ICD-10-CM | POA: Insufficient documentation

## 2014-03-11 DIAGNOSIS — K5900 Constipation, unspecified: Secondary | ICD-10-CM | POA: Insufficient documentation

## 2014-03-11 DIAGNOSIS — O9989 Other specified diseases and conditions complicating pregnancy, childbirth and the puerperium: Principal | ICD-10-CM

## 2014-03-11 DIAGNOSIS — O99891 Other specified diseases and conditions complicating pregnancy: Secondary | ICD-10-CM | POA: Diagnosis not present

## 2014-03-11 LAB — URINALYSIS, ROUTINE W REFLEX MICROSCOPIC
Bilirubin Urine: NEGATIVE
GLUCOSE, UA: NEGATIVE mg/dL
HGB URINE DIPSTICK: NEGATIVE
Ketones, ur: NEGATIVE mg/dL
Nitrite: NEGATIVE
Protein, ur: NEGATIVE mg/dL
SPECIFIC GRAVITY, URINE: 1.015 (ref 1.005–1.030)
UROBILINOGEN UA: 1 mg/dL (ref 0.0–1.0)
pH: 7.5 (ref 5.0–8.0)

## 2014-03-11 LAB — AMNISURE RUPTURE OF MEMBRANE (ROM) NOT AT ARMC: Amnisure ROM: NEGATIVE

## 2014-03-11 LAB — URINE MICROSCOPIC-ADD ON

## 2014-03-11 LAB — WET PREP, GENITAL
Clue Cells Wet Prep HPF POC: NONE SEEN
TRICH WET PREP: NONE SEEN
Yeast Wet Prep HPF POC: NONE SEEN

## 2014-03-11 NOTE — Discharge Instructions (Signed)

## 2014-03-11 NOTE — MAU Provider Note (Signed)
Received report from Arville Go, CNM in re: to pt's complaint of lower abdominal pain, pressure and ? LOF.  Full evaluation completed by Arville Go, CNM who reported that stool was palpable.   Results of tests are as follows:  Results for orders placed during the hospital encounter of 03/11/14 (from the past 24 hour(s))  URINALYSIS, ROUTINE W REFLEX MICROSCOPIC     Status: Abnormal   Collection Time    03/11/14  5:41 PM      Result Value Ref Range   Color, Urine YELLOW  YELLOW   APPearance CLEAR  CLEAR   Specific Gravity, Urine 1.015  1.005 - 1.030   pH 7.5  5.0 - 8.0   Glucose, UA NEGATIVE  NEGATIVE mg/dL   Hgb urine dipstick NEGATIVE  NEGATIVE   Bilirubin Urine NEGATIVE  NEGATIVE   Ketones, ur NEGATIVE  NEGATIVE mg/dL   Protein, ur NEGATIVE  NEGATIVE mg/dL   Urobilinogen, UA 1.0  0.0 - 1.0 mg/dL   Nitrite NEGATIVE  NEGATIVE   Leukocytes, UA SMALL (*) NEGATIVE  URINE MICROSCOPIC-ADD ON     Status: Abnormal   Collection Time    03/11/14  5:41 PM      Result Value Ref Range   Squamous Epithelial / LPF MANY (*) RARE   WBC, UA 0-2  <3 WBC/hpf   Bacteria, UA MANY (*) RARE  AMNISURE RUPTURE OF MEMBRANE (ROM)     Status: None   Collection Time    03/11/14  7:07 PM      Result Value Ref Range   Amnisure ROM NEGATIVE    WET PREP, GENITAL     Status: Abnormal   Collection Time    03/11/14  7:08 PM      Result Value Ref Range   Yeast Wet Prep HPF POC NONE SEEN  NONE SEEN   Trich, Wet Prep NONE SEEN  NONE SEEN   Clue Cells Wet Prep HPF POC NONE SEEN  NONE SEEN   WBC, Wet Prep HPF POC FEW (*) NONE SEEN   Pt received soap suds enema while in MAU and reported feeling much better after BM - no further abdominal pain/pressure. Amnisure neg.  Pt was d/c'd home in stable condition w/ strict PTL precautions. She is to keep her next OB appt on 03/15/14.   Farrel Gordon, CNM, MS 03/11/14 @ 8:56 PM

## 2014-03-11 NOTE — MAU Note (Signed)
abd pain, started on Wed, comes and goes.  Feeling increase in pelvic pressure. Little bit of watery clear d/c yesterday

## 2014-03-12 LAB — GC/CHLAMYDIA PROBE AMP
CT PROBE, AMP APTIMA: NEGATIVE
GC PROBE AMP APTIMA: NEGATIVE

## 2014-03-27 ENCOUNTER — Inpatient Hospital Stay (HOSPITAL_COMMUNITY)
Admission: AD | Admit: 2014-03-27 | Discharge: 2014-03-27 | Disposition: A | Discharge status: Home / Self Care | Payer: 59 | Source: Ambulatory Visit | Attending: Obstetrics & Gynecology | Admitting: Obstetrics & Gynecology

## 2014-03-27 ENCOUNTER — Encounter (HOSPITAL_COMMUNITY): Payer: Self-pay | Admitting: *Deleted

## 2014-03-27 DIAGNOSIS — F329 Major depressive disorder, single episode, unspecified: Secondary | ICD-10-CM | POA: Diagnosis not present

## 2014-03-27 DIAGNOSIS — O99891 Other specified diseases and conditions complicating pregnancy: Secondary | ICD-10-CM | POA: Diagnosis not present

## 2014-03-27 DIAGNOSIS — O99340 Other mental disorders complicating pregnancy, unspecified trimester: Secondary | ICD-10-CM | POA: Diagnosis not present

## 2014-03-27 DIAGNOSIS — Z91040 Latex allergy status: Secondary | ICD-10-CM | POA: Insufficient documentation

## 2014-03-27 DIAGNOSIS — R109 Unspecified abdominal pain: Secondary | ICD-10-CM | POA: Diagnosis present

## 2014-03-27 DIAGNOSIS — F3289 Other specified depressive episodes: Secondary | ICD-10-CM | POA: Diagnosis not present

## 2014-03-27 DIAGNOSIS — M5430 Sciatica, unspecified side: Secondary | ICD-10-CM | POA: Diagnosis not present

## 2014-03-27 DIAGNOSIS — O99810 Abnormal glucose complicating pregnancy: Secondary | ICD-10-CM | POA: Insufficient documentation

## 2014-03-27 DIAGNOSIS — O9989 Other specified diseases and conditions complicating pregnancy, childbirth and the puerperium: Principal | ICD-10-CM

## 2014-03-27 LAB — URINALYSIS, ROUTINE W REFLEX MICROSCOPIC
BILIRUBIN URINE: NEGATIVE
Glucose, UA: NEGATIVE mg/dL
HGB URINE DIPSTICK: NEGATIVE
KETONES UR: NEGATIVE mg/dL
NITRITE: NEGATIVE
PROTEIN: NEGATIVE mg/dL
SPECIFIC GRAVITY, URINE: 1.01 (ref 1.005–1.030)
UROBILINOGEN UA: 0.2 mg/dL (ref 0.0–1.0)
pH: 7.5 (ref 5.0–8.0)

## 2014-03-27 LAB — WET PREP, GENITAL
CLUE CELLS WET PREP: NONE SEEN
Trich, Wet Prep: NONE SEEN
Yeast Wet Prep HPF POC: NONE SEEN

## 2014-03-27 LAB — URINE MICROSCOPIC-ADD ON

## 2014-03-27 LAB — FETAL FIBRONECTIN: FETAL FIBRONECTIN: NEGATIVE

## 2014-03-27 MED ORDER — HYDROCODONE-ACETAMINOPHEN 5-325 MG PO TABS
2.0000 | ORAL_TABLET | Freq: Once | ORAL | Status: AC
  Administered 2014-03-27: 2 via ORAL
  Filled 2014-03-27: qty 2

## 2014-03-27 MED ORDER — LACTATED RINGERS IV SOLN: INTRAVENOUS | Status: DC

## 2014-03-27 MED ORDER — LACTATED RINGERS IV BOLUS (SEPSIS)
500.0000 mL | Freq: Once | INTRAVENOUS | Status: AC
  Administered 2014-03-27: 500 mL via INTRAVENOUS

## 2014-03-27 NOTE — MAU Note (Addendum)
Pt reports sharp abdominal pain for one hour. Pt states that it feels like she "pulled a muscle". Pt was sitting at her friends house and started to feel the pain. Pt reports that she tried to lay down to relax, but she found it difficult to lift her legs. Pt states that she feels pelvic pressure and pain in her hip.

## 2014-03-27 NOTE — Discharge Instructions (Signed)
Sciatica Sciatica is pain, weakness, numbness, or tingling along the path of the sciatic nerve. The nerve starts in the lower back and runs down the back of each leg. The nerve controls the muscles in the lower leg and in the back of the knee, while also providing sensation to the back of the thigh, lower leg, and the sole of your foot. Sciatica is a symptom of another medical condition. For instance, nerve damage or certain conditions, such as a herniated disk or bone spur on the spine, pinch or put pressure on the sciatic nerve. This causes the pain, weakness, or other sensations normally associated with sciatica. Generally, sciatica only affects one side of the body. CAUSES   Herniated or slipped disc.  Degenerative disk disease.  A pain disorder involving the narrow muscle in the buttocks (piriformis syndrome).  Pelvic injury or fracture.  Pregnancy.  Tumor (rare). SYMPTOMS  Symptoms can vary from mild to very severe. The symptoms usually travel from the low back to the buttocks and down the back of the leg. Symptoms can include:  Mild tingling or dull aches in the lower back, leg, or hip.  Numbness in the back of the calf or sole of the foot.  Burning sensations in the lower back, leg, or hip.  Sharp pains in the lower back, leg, or hip.  Leg weakness.  Severe back pain inhibiting movement. These symptoms may get worse with coughing, sneezing, laughing, or prolonged sitting or standing. Also, being overweight may worsen symptoms. DIAGNOSIS  Your caregiver will perform a physical exam to look for common symptoms of sciatica. He or she may ask you to do certain movements or activities that would trigger sciatic nerve pain. Other tests may be performed to find the cause of the sciatica. These may include:  Blood tests.  X-rays.  Imaging tests, such as an MRI or CT scan. TREATMENT  Treatment is directed at the cause of the sciatic pain. Sometimes, treatment is not necessary  and the pain and discomfort goes away on its own. If treatment is needed, your caregiver may suggest:  Over-the-counter medicines to relieve pain.  Prescription medicines, such as anti-inflammatory medicine, muscle relaxants, or narcotics.  Applying heat or ice to the painful area.  Steroid injections to lessen pain, irritation, and inflammation around the nerve.  Reducing activity during periods of pain.  Exercising and stretching to strengthen your abdomen and improve flexibility of your spine. Your caregiver may suggest losing weight if the extra weight makes the back pain worse.  Physical therapy.  Surgery to eliminate what is pressing or pinching the nerve, such as a bone spur or part of a herniated disk. HOME CARE INSTRUCTIONS   Only take over-the-counter or prescription medicines for pain or discomfort as directed by your caregiver.  Apply ice to the affected area for 20 minutes, 3-4 times a day for the first 48-72 hours. Then try heat in the same way.  Exercise, stretch, or perform your usual activities if these do not aggravate your pain.  Attend physical therapy sessions as directed by your caregiver.  Keep all follow-up appointments as directed by your caregiver.  Do not wear high heels or shoes that do not provide proper support.  Check your mattress to see if it is too soft. A firm mattress may lessen your pain and discomfort. SEEK IMMEDIATE MEDICAL CARE IF:   You lose control of your bowel or bladder (incontinence).  You have increasing weakness in the lower back, pelvis, buttocks,   or legs.  You have redness or swelling of your back.  You have a burning sensation when you urinate.  You have pain that gets worse when you lie down or awakens you at night.  Your pain is worse than you have experienced in the past.  Your pain is lasting longer than 4 weeks.  You are suddenly losing weight without reason. MAKE SURE YOU:  Understand these  instructions.  Will watch your condition.  Will get help right away if you are not doing well or get worse. Document Released: 08/13/2001 Document Revised: 02/18/2012 Document Reviewed: 12/29/2011 Exit[employer] Patient Information 2015 [employer]. This information is not intended to replace advice given to you by your health care provider. Make sure you discuss any questions you have with your health care provider.  

## 2014-03-27 NOTE — MAU Provider Note (Signed)
History   33yo, G3P2002 at [redacted]w[redacted]d presents to MAU with c/o sharp pain for one hour, when asked to point she points to just  Below her left gluteal muscle and states that it radiates down her leg.  Pt states that it feels like she "pulled a muscle". Pt was sitting at her friends house and started to feel the pain. Pt reports that she tried to lay down to relax, but she found it difficult lift her legs. When pt was asked about cramping she stated she has had cramping but unable to say when it started or its nature.  Pt also states that she has felt the need to void but unable to do so, last normal void was 1 hour ago.   Patient Active Problem List   Diagnosis Date Noted  . Constipation 03/11/2014  . Depression--has been referred to counseling, on Zoloft. 02/20/2014  . Gestational diabetes 11/29/2013  . 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 Nausea and vomiting in pregnancy 09/27/2013  . Ptyalism 09/27/2013    Chief Complaint  Patient presents with  . Abdominal Pain   HPI  OB History   Grav Para Term Preterm Abortions TAB SAB Ect Mult Living   3 2 2  0 0 0 0 0 0 2      Past Medical History  Diagnosis Date  . Urinary tract infection   . Trichomonas 02/2011    treated  . Sinusitis   . Gestational diabetes   . Abnormal Pap smear   . H/O varicella   . GDM (gestational diabetes mellitus)   . Hyperemesis     During first pregnancy  . Hx of bacterial infection   . Asthma   . Depression     Past Surgical History  Procedure Laterality Date  . No past surgeries      Family History  Problem Relation Age of Onset  . Diabetes Mother   . Hypertension Mother   . Peripheral vascular disease Mother   . Thyroid disease Mother   . Asthma Mother   . Diabetes Father   . Hypertension Father   . Anesthesia problems Neg Hx   . Hypotension Neg Hx   . Malignant hyperthermia Neg Hx   . Pseudochol deficiency Neg Hx   . Seizures Maternal Aunt   .  Alcohol abuse Maternal Aunt   . Heart attack Maternal Uncle   . Kidney disease Maternal Uncle   . Cancer Maternal Uncle     prostate  . Thyroid disease Paternal Aunt   . Heart attack Paternal Uncle   . Kidney disease Paternal Uncle     History  Substance Use Topics  . Smoking status: Never Smoker   . Smokeless tobacco: Never Used  . Alcohol Use: No    Allergies:  Allergies  Allergen Reactions  . Flagyl [Metronidazole Hcl] Swelling  . Latex Hives  . Other Other (See Comments)    Pt has a reaction to plastic tape. She breaks out with a rash and her skin peels.    Prescriptions prior to admission  Medication Sig Dispense Refill  . docusate sodium (COLACE) 100 MG capsule Take 200 mg by mouth daily as needed for mild constipation.       Marland Kitchen doxylamine, Sleep, (UNISOM) 25 MG tablet Take 25 mg by mouth at bedtime.      Marland Kitchen glycopyrrolate (ROBINUL) 1 MG tablet Take 1 mg by mouth every 8 (eight) hours as needed (for spitting).       Marland Kitchen  metoCLOPramide (REGLAN) 10 MG tablet Take 1 tablet (10 mg total) by mouth 4 (four) times daily -  before meals and at bedtime.  120 tablet  0  . ondansetron (ZOFRAN ODT) 8 MG disintegrating tablet Take 1 tablet (8 mg total) by mouth every 8 (eight) hours as needed for nausea or vomiting.  20 tablet  2  . pantoprazole (PROTONIX) 40 MG tablet Take 1 tablet (40 mg total) by mouth daily.  30 tablet  4  . sertraline (ZOLOFT) 50 MG tablet Take 100 mg by mouth daily.        ROS ROS: see HPI above, all other systems are negative  Physical Exam   Blood pressure 121/69, pulse 77, resp. rate 20.  Physical Exam Chest: Clear Heart: RRR Abdomen: gravid, NT Extremities: WNL  Dilation: Closed Effacement (%): 50 Cervical Position: Posterior Exam by:: J Jennette Leask  FHT:  NST reactive UCs: Rare  ED Course  IUP at [redacted]w[redacted]d Sciatic pain with nonspecific cramping  Wet prep - neg UA - neg GC/CT - pending FFN - neg IVF Norco 2 tabs PO Once  D/c home with  precautions F/u at already scheduled ROB on 7/31    Linda Hedges CNM, MSN 03/27/2014 12:51 AM

## 2014-03-28 LAB — GC/CHLAMYDIA PROBE AMP
CT PROBE, AMP APTIMA: NEGATIVE
GC PROBE AMP APTIMA: NEGATIVE

## 2014-04-01 ENCOUNTER — Inpatient Hospital Stay (HOSPITAL_COMMUNITY): Payer: Medicaid Other

## 2014-04-01 ENCOUNTER — Encounter (HOSPITAL_COMMUNITY): Payer: Self-pay

## 2014-04-01 ENCOUNTER — Inpatient Hospital Stay (HOSPITAL_COMMUNITY)
Admission: AD | Admit: 2014-04-01 | Discharge: 2014-04-01 | Disposition: A | Discharge status: Home / Self Care | Payer: Medicaid Other | Source: Ambulatory Visit | Attending: Obstetrics and Gynecology | Admitting: Obstetrics and Gynecology

## 2014-04-01 DIAGNOSIS — F3289 Other specified depressive episodes: Secondary | ICD-10-CM | POA: Diagnosis not present

## 2014-04-01 DIAGNOSIS — Z91040 Latex allergy status: Secondary | ICD-10-CM | POA: Insufficient documentation

## 2014-04-01 DIAGNOSIS — F329 Major depressive disorder, single episode, unspecified: Secondary | ICD-10-CM | POA: Insufficient documentation

## 2014-04-01 DIAGNOSIS — O24419 Gestational diabetes mellitus in pregnancy, unspecified control: Secondary | ICD-10-CM

## 2014-04-01 DIAGNOSIS — O99340 Other mental disorders complicating pregnancy, unspecified trimester: Secondary | ICD-10-CM | POA: Diagnosis not present

## 2014-04-01 DIAGNOSIS — O9989 Other specified diseases and conditions complicating pregnancy, childbirth and the puerperium: Principal | ICD-10-CM

## 2014-04-01 DIAGNOSIS — O99891 Other specified diseases and conditions complicating pregnancy: Secondary | ICD-10-CM | POA: Insufficient documentation

## 2014-04-01 HISTORY — DX: Unspecified abnormal cytological findings in specimens from vagina: R87.629

## 2014-04-01 NOTE — MAU Provider Note (Signed)
BPP 8/8 EFW 7 1/2lbs Reactive strip    Consulted with Dr. Mancel Bale DC to home  PTL precautions given Kick counts reviewed. Monitor Blood sugars, bring record to each ROB FU in office, Keep next [place], CNM, MSN 04/01/2014. 5:53 PM

## 2014-04-01 NOTE — Discharge Instructions (Signed)
Diabetes and Exercise Exercising regularly is important. It is not just about losing weight. It has many health benefits, such as:  Improving your overall fitness, flexibility, and endurance.  Increasing your bone density.  Helping with weight control.  Decreasing your body fat.  Increasing your muscle strength.  Reducing stress and tension.  Improving your overall health. People with diabetes who exercise gain additional benefits because exercise:  Reduces appetite.  Improves the body's use of blood sugar (glucose).  Helps lower or control blood glucose.  Decreases blood pressure.  Helps control blood lipids (such as cholesterol and triglycerides).  Improves the body's use of the hormone insulin by:  Increasing the body's insulin sensitivity.  Reducing the body's insulin needs.  Decreases the risk for heart disease because exercising:  Lowers cholesterol and triglycerides levels.  Increases the levels of good cholesterol (such as high-density lipoproteins [HDL]) in the body.  Lowers blood glucose levels. YOUR ACTIVITY PLAN  Choose an activity that you enjoy and set realistic goals. Your health care provider or diabetes educator can help you make an activity plan that works for you. Exercise regularly as directed by your health care provider. This includes:  Performing resistance training twice a week such as push-ups, sit-ups, lifting weights, or using resistance bands.  Performing 150 minutes of cardio exercises each week such as walking, running, or playing sports.  Staying active and spending no more than 90 minutes at one time being inactive. Even short bursts of exercise are good for you. Three 10-minute sessions spread throughout the day are just as beneficial as a single 30-minute session. Some exercise ideas include:  Taking the dog for a walk.  Taking the stairs instead of the elevator.  Dancing to your favorite song.  Doing an exercise  video.  Doing your favorite exercise with a friend. RECOMMENDATIONS FOR EXERCISING WITH TYPE 1 OR TYPE 2 DIABETES   Check your blood glucose before exercising. If blood glucose levels are greater than 240 mg/dL, check for urine ketones. Do not exercise if ketones are present.  Avoid injecting insulin into areas of the body that are going to be exercised. For example, avoid injecting insulin into:  The arms when playing tennis.  The legs when jogging.  Keep a record of:  Food intake before and after you exercise.  Expected peak times of insulin action.  Blood glucose levels before and after you exercise.  The type and amount of exercise you have done.  Review your records with your health care provider. Your health care provider will help you to develop guidelines for adjusting food intake and insulin amounts before and after exercising.  If you take insulin or oral hypoglycemic agents, watch for signs and symptoms of hypoglycemia. They include:  Dizziness.  Shaking.  Sweating.  Chills.  Confusion.  Drink plenty of water while you exercise to prevent dehydration or heat stroke. Body water is lost during exercise and must be replaced.  Talk to your health care provider before starting an exercise program to make sure it is safe for you. Remember, almost any type of activity is better than none. Document Released: 11/09/2003 Document Revised: 01/03/2014 Document Reviewed: 01/26/2013 Exit[employer] Patient Information 2015 [employer]. This information is not intended to replace advice given to you by your health care provider. Make sure you discuss any questions you have with your health care provider.  

## 2014-04-01 NOTE — MAU Provider Note (Signed)
[name] is a 33 y.o. G3 P2002 at 34.1 weeks presents to MAU from the office for prolong monitoring and [place].  Pt did not bring her [facility] records and states she is "too busy and forgets to check her BS".  She denies pain, vb or lof w/+FM.  She report and occasional abd tightening but "It's not like the ctx from my other child".    History     Patient Active Problem List   Diagnosis Date Noted  . Constipation 03/11/2014  . Depression--has been referred to counseling, on Zoloft. 02/20/2014  . Gestational diabetes 11/29/2013  . 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 Nausea and vomiting in pregnancy 09/27/2013  . Ptyalism 09/27/2013    Chief Complaint  Patient presents with  . Non-stress Test   HPI  OB History   Grav Para Term Preterm Abortions TAB SAB Ect Mult Living   3 2 2  0 0 0 0 0 0 2      Past Medical History  Diagnosis Date  . Urinary tract infection   . Trichomonas 02/2011    treated  . Sinusitis   . Gestational diabetes   . Abnormal Pap smear   . H/O varicella   . GDM (gestational diabetes mellitus)   . Hyperemesis     During first pregnancy  . Hx of bacterial infection   . Asthma   . Depression   . Vaginal Pap smear, abnormal     Past Surgical History  Procedure Laterality Date  . No past surgeries      Family History  Problem Relation Age of Onset  . Diabetes Mother   . Hypertension Mother   . Peripheral vascular disease Mother   . Thyroid disease Mother   . Asthma Mother   . Diabetes Father   . Hypertension Father   . Anesthesia problems Neg Hx   . Hypotension Neg Hx   . Malignant hyperthermia Neg Hx   . Pseudochol deficiency Neg Hx   . Seizures Maternal Aunt   . Alcohol abuse Maternal Aunt   . Heart attack Maternal Uncle   . Kidney disease Maternal Uncle   . Cancer Maternal Uncle     prostate  . Thyroid disease Paternal Aunt   . Heart attack Paternal Uncle   . Kidney disease Paternal Uncle      History  Substance Use Topics  . Smoking status: Never Smoker   . Smokeless tobacco: Never Used  . Alcohol Use: No    Allergies:  Allergies  Allergen Reactions  . Flagyl [Metronidazole Hcl] Swelling  . Latex Hives  . Other Other (See Comments)    Pt has a reaction to plastic tape. She breaks out with a rash and her skin peels.    Prescriptions prior to admission  Medication Sig Dispense Refill  . docusate sodium (COLACE) 100 MG capsule Take 200 mg by mouth daily as needed for mild constipation.       Marland Kitchen doxylamine, Sleep, (UNISOM) 25 MG tablet Take 25 mg by mouth at bedtime.      Marland Kitchen glycopyrrolate (ROBINUL) 1 MG tablet Take 1 mg by mouth every 8 (eight) hours as needed (for spitting).       . metoCLOPramide (REGLAN) 10 MG tablet Take 1 tablet (10 mg total) by mouth 4 (four) times daily -  before meals and at bedtime.  120 tablet  0  . ondansetron (ZOFRAN ODT) 8 MG disintegrating tablet Take 1  tablet (8 mg total) by mouth every 8 (eight) hours as needed for nausea or vomiting.  20 tablet  2  . pantoprazole (PROTONIX) 40 MG tablet Take 1 tablet (40 mg total) by mouth daily.  30 tablet  4  . Prenatal Vit-Fe Fumarate-FA (PRENATAL MULTIVITAMIN) TABS tablet Take 1 tablet by mouth daily at 12 noon.      . sertraline (ZOLOFT) 50 MG tablet Take 100 mg by mouth daily.        ROS See HPI above, all other systems are negative  Physical Exam   Blood pressure 124/74, pulse 98, temperature 98.1 F (36.7 C), temperature source Oral, resp. rate 16, height 5' 3.5" (1.613 m), weight 235 lb 6.4 oz (106.777 kg).  Physical Exam  Chest: CTA Heart: RRR w/o murmer Ext:  WNL ABD: Soft, non tender to palpation, no rebound or guarding SVE: deferred    ED Course  Assessment: IUP at  34.1weeks Membranes: intact FHR: Category 1 CTX:  n/a US-BPP at the office 6/8 NST at the office was reactive  Plan:  Labs: CBC, TSH, GC/CL, Wet prep, GBS 6 hr observation Consult with Dr.   Alvera Novel  Eugean Arnott, CNM, MSN 04/01/2014. 1:58 PM

## 2014-04-01 NOTE — MAU Note (Signed)
Patient was sent from the office for a NST and BPP.

## 2014-04-07 LAB — OB RESULTS CONSOLE GBS: GBS: POSITIVE

## 2014-04-12 ENCOUNTER — Inpatient Hospital Stay (HOSPITAL_COMMUNITY)
Admission: AD | Admit: 2014-04-12 | Discharge: 2014-04-12 | Disposition: A | Discharge status: Home / Self Care | Payer: Medicaid Other | Source: Ambulatory Visit | Attending: Obstetrics and Gynecology | Admitting: Obstetrics and Gynecology

## 2014-04-12 ENCOUNTER — Encounter (HOSPITAL_COMMUNITY): Payer: Self-pay | Admitting: *Deleted

## 2014-04-12 DIAGNOSIS — R109 Unspecified abdominal pain: Secondary | ICD-10-CM | POA: Insufficient documentation

## 2014-04-12 DIAGNOSIS — O9989 Other specified diseases and conditions complicating pregnancy, childbirth and the puerperium: Principal | ICD-10-CM

## 2014-04-12 DIAGNOSIS — Z91040 Latex allergy status: Secondary | ICD-10-CM | POA: Diagnosis not present

## 2014-04-12 DIAGNOSIS — O99891 Other specified diseases and conditions complicating pregnancy: Secondary | ICD-10-CM | POA: Insufficient documentation

## 2014-04-12 LAB — WET PREP, GENITAL
Clue Cells Wet Prep HPF POC: NONE SEEN
TRICH WET PREP: NONE SEEN
YEAST WET PREP: NONE SEEN

## 2014-04-12 LAB — URINALYSIS, ROUTINE W REFLEX MICROSCOPIC
Bilirubin Urine: NEGATIVE
GLUCOSE, UA: NEGATIVE mg/dL
HGB URINE DIPSTICK: NEGATIVE
Ketones, ur: NEGATIVE mg/dL
Nitrite: NEGATIVE
PH: 6.5 (ref 5.0–8.0)
Protein, ur: NEGATIVE mg/dL
SPECIFIC GRAVITY, URINE: 1.015 (ref 1.005–1.030)
Urobilinogen, UA: 1 mg/dL (ref 0.0–1.0)

## 2014-04-12 LAB — URINE MICROSCOPIC-ADD ON

## 2014-04-12 MED ORDER — ACETAMINOPHEN 325 MG PO TABS
650.0000 mg | ORAL_TABLET | Freq: Once | ORAL | Status: AC
  Administered 2014-04-12: 650 mg via ORAL
  Filled 2014-04-12: qty 2

## 2014-04-12 NOTE — MAU Provider Note (Signed)
History   Patient is a 32y.o. [id number] at 35.5wks who presents with complaints of hip and lower abdominal pain.  Patient states pain started at 0200 and has been ongoing throughout the day.  Patient states that she has attempted position changes and warm baths with no relief.  Patient denies taking any pain medication and denies LOF, VB, and reports fetal movement was decreased, but increased after eating.  Patient also reporting vaginal/pelvic pressure and states she has feelings of relief (from the pressure) with urination.  However, patient denies other issues with urination.  Patient reports bowel movement yesterday, but was unsure of any issues (i.e. Hard stool, hard to pass, larger than normal stool). Patient unsure of whether or not she is having contractions, stating "I haven't figured that out yet."  Patient Active Problem List   Diagnosis Date Noted  . Constipation 03/11/2014  . Depression--has been referred to counseling, on Zoloft. 02/20/2014  . Gestational diabetes 11/29/2013  . 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 Nausea and vomiting in pregnancy 09/27/2013  . Ptyalism 09/27/2013    No chief complaint on file.  HPI  OB History   Grav Para Term Preterm Abortions TAB SAB Ect Mult Living   3 2 2  0 0 0 0 0 0 2      Past Medical History  Diagnosis Date  . Urinary tract infection   . Trichomonas 02/2011    treated  . Sinusitis   . Gestational diabetes   . Abnormal Pap smear   . H/O varicella   . GDM (gestational diabetes mellitus)   . Hyperemesis     During first pregnancy  . Hx of bacterial infection   . Asthma   . Depression   . Vaginal Pap smear, abnormal     Past Surgical History  Procedure Laterality Date  . No past surgeries      Family History  Problem Relation Age of Onset  . Diabetes Mother   . Hypertension Mother   . Peripheral vascular disease Mother   . Thyroid disease Mother   . Asthma Mother   .  Diabetes Father   . Hypertension Father   . Anesthesia problems Neg Hx   . Hypotension Neg Hx   . Malignant hyperthermia Neg Hx   . Pseudochol deficiency Neg Hx   . Seizures Maternal Aunt   . Alcohol abuse Maternal Aunt   . Heart attack Maternal Uncle   . Kidney disease Maternal Uncle   . Cancer Maternal Uncle     prostate  . Thyroid disease Paternal Aunt   . Heart attack Paternal Uncle   . Kidney disease Paternal Uncle     History  Substance Use Topics  . Smoking status: Never Smoker   . Smokeless tobacco: Never Used  . Alcohol Use: No    Allergies:  Allergies  Allergen Reactions  . Flagyl [Metronidazole Hcl] Swelling  . Latex Hives  . Other Other (See Comments)    Pt has a reaction to plastic tape. She breaks out with a rash and her skin peels.    Prescriptions prior to admission  Medication Sig Dispense Refill  . docusate sodium (COLACE) 100 MG capsule Take 200 mg by mouth daily as needed for mild constipation.       Marland Kitchen doxylamine, Sleep, (UNISOM) 25 MG tablet Take 25 mg by mouth at bedtime.      Marland Kitchen glycopyrrolate (ROBINUL) 1 MG tablet Take 1 mg by mouth  every 8 (eight) hours as needed (for spitting).       . metoCLOPramide (REGLAN) 10 MG tablet Take 1 tablet (10 mg total) by mouth 4 (four) times daily -  before meals and at bedtime.  120 tablet  0  . ondansetron (ZOFRAN ODT) 8 MG disintegrating tablet Take 1 tablet (8 mg total) by mouth every 8 (eight) hours as needed for nausea or vomiting.  20 tablet  2  . pantoprazole (PROTONIX) 40 MG tablet Take 1 tablet (40 mg total) by mouth daily.  30 tablet  4  . Prenatal Vit-Fe Fumarate-FA (PRENATAL MULTIVITAMIN) TABS tablet Take 1 tablet by mouth daily at 12 noon.      . sertraline (ZOLOFT) 50 MG tablet Take 100 mg by mouth daily.        ROS  See HPI Above Physical Exam   Blood pressure 153/83, pulse 76, temperature 97.9 F (36.6 C), temperature source Oral, resp. rate 18, height 5\' 3"  (1.6 m), weight 239 lb (108.41 kg),  SpO2 98.00%. Results for orders placed during the hospital encounter of 04/12/14 (from the past 24 hour(s))  URINALYSIS, ROUTINE W REFLEX MICROSCOPIC     Status: Abnormal   Collection Time    04/12/14  8:35 PM      Result Value Ref Range   Color, Urine YELLOW  YELLOW   APPearance CLEAR  CLEAR   Specific Gravity, Urine 1.015  1.005 - 1.030   pH 6.5  5.0 - 8.0   Glucose, UA NEGATIVE  NEGATIVE mg/dL   Hgb urine dipstick NEGATIVE  NEGATIVE   Bilirubin Urine NEGATIVE  NEGATIVE   Ketones, ur NEGATIVE  NEGATIVE mg/dL   Protein, ur NEGATIVE  NEGATIVE mg/dL   Urobilinogen, UA 1.0  0.0 - 1.0 mg/dL   Nitrite NEGATIVE  NEGATIVE   Leukocytes, UA TRACE (*) NEGATIVE  URINE MICROSCOPIC-ADD ON     Status: Abnormal   Collection Time    04/12/14  8:35 PM      Result Value Ref Range   Squamous Epithelial / LPF MANY (*) RARE   WBC, UA 0-2  <3 WBC/hpf   RBC / HPF 0-2  <3 RBC/hpf   Bacteria, UA FEW (*) RARE  WET PREP, GENITAL     Status: Abnormal   Collection Time    04/12/14  9:25 PM      Result Value Ref Range   Yeast Wet Prep HPF POC NONE SEEN  NONE SEEN   Trich, Wet Prep NONE SEEN  NONE SEEN   Clue Cells Wet Prep HPF POC NONE SEEN  NONE SEEN   WBC, Wet Prep HPF POC FEW (*) NONE SEEN     Physical Exam  Constitutional: She is oriented to person, place, and time. She appears well-developed and well-nourished.  Cardiovascular: Normal rate.   Respiratory: Effort normal.  GI: Soft. There is tenderness in the right lower quadrant and left lower quadrant.  Appears gravid--fundal height appears excessive for GA   Genitourinary: Cervix exhibits no motion tenderness, no discharge and no friability. No bleeding around the vagina. Vaginal discharge found.   Speculum Exam: -Vaginal Vault: Thin white discharge noted-wet prep collected -Cervix: Os appears closed-GC/CT collected  Bimanual Exam: Closed/Long/soft/-3  Rectum full of stool   Musculoskeletal: Normal range of motion.       Right hip: She  exhibits tenderness.       Left hip: She exhibits tenderness.  Neurological: She is alert and oriented to person, place, and time.  Skin:  Skin is warm and dry.  Psychiatric: She has a normal mood and affect. Judgment and thought content normal. Her speech is delayed. Cognition and memory are normal.   FHR: 140 bpm, Mod Var, -Decels, +Accels UC: None graphed or palpated  ED Course  Assessment: IUP at 35.5wks Cat I FT Abdominal Pain  Plan: -Labs: UA, Gc/CT, Wet Prep -Tylenol for pain  Follow Up (2210) -UA-Normal -Wet Prep-Normal -GC/CT-Pending -Lab results discussed-no questions or concerns -Discussed comfort measures for hip & round ligament pain -Discussed need for regular bowel movements--Given instructions for suppositories and stool softeners -Keep appt as scheduled: 04/15/2014 -Encouraged to call if you have any questions or concerns prior to your next visit.  -Discharged to home in stable condition  Keirstin Musil LYNN CNM, MSN 04/12/2014 9:35 PM

## 2014-04-12 NOTE — Discharge Instructions (Signed)
Hip Pain Your hip is the joint between your upper legs and your lower pelvis. The bones, cartilage, tendons, and muscles of your hip joint perform a lot of work each day supporting your body weight and allowing you to move around. Hip pain can range from a minor ache to severe pain in one or both of your hips. Pain may be felt on the inside of the hip joint near the groin, or the outside near the buttocks and upper thigh. You may have swelling or stiffness as well.  HOME CARE INSTRUCTIONS   Take medicines only as directed by your health care provider.  Apply ice to the injured area:  Put ice in a plastic bag.  Place a towel between your skin and the bag.  Leave the ice on for 15-20 minutes at a time, 3-4 times a day.  Keep your leg raised (elevated) when possible to lessen swelling.  Avoid activities that cause pain.  Follow specific exercises as directed by your health care provider.  Sleep with a pillow between your legs on your most comfortable side.  Record how often you have hip pain, the location of the pain, and what it feels like. SEEK MEDICAL CARE IF:   You are unable to put weight on your leg.  Your hip is red or swollen or very tender to touch.  Your pain or swelling continues or worsens after 1 week.  You have increasing difficulty walking.  You have a fever. SEEK IMMEDIATE MEDICAL CARE IF:   You have fallen.  You have a sudden increase in pain and swelling in your hip. MAKE SURE YOU:   Understand these instructions.  Will watch your condition.  Will get help right away if you are not doing well or get worse. Document Released: 02/06/2010 Document Revised: 01/03/2014 Document Reviewed: 04/15/2013 [facility] Patient Information 2015 [place], [place]. This information is not intended to replace advice given to you by your health care provider. Make sure you discuss any questions you have with your health care provider. Constipation Constipation is when a  person has fewer than three bowel movements a week, has difficulty having a bowel movement, or has stools that are dry, hard, or larger than normal. As people grow older, constipation is more common. If you try to fix constipation with medicines that make you have a bowel movement (laxatives), the problem may get worse. Long-term laxative use may cause the muscles of the colon to become weak. A low-fiber diet, not taking in enough fluids, and taking certain medicines may make constipation worse.  CAUSES   Certain medicines, such as antidepressants, pain medicine, iron supplements, antacids, and water pills.   Certain diseases, such as diabetes, irritable bowel syndrome (IBS), thyroid disease, or depression.   Not drinking enough water.   Not eating enough fiber-rich foods.   Stress or travel.   Lack of physical activity or exercise.   Ignoring the urge to have a bowel movement.   Using laxatives too much.  SIGNS AND SYMPTOMS   Having fewer than three bowel movements a week.   Straining to have a bowel movement.   Having stools that are hard, dry, or larger than normal.   Feeling full or bloated.   Pain in the lower abdomen.   Not feeling relief after having a bowel movement.  DIAGNOSIS  Your health care provider will take a medical history and perform a physical exam. Further testing may be done for severe constipation. Some tests may include:  A barium enema X-ray to examine your rectum, colon, and, sometimes, your small intestine.   A sigmoidoscopy to examine your lower colon.   A colonoscopy to examine your entire colon. TREATMENT  Treatment will depend on the severity of your constipation and what is causing it. Some dietary treatments include drinking more fluids and eating more fiber-rich foods. Lifestyle treatments may include regular exercise. If these diet and lifestyle recommendations do not help, your health care provider may recommend taking  over-the-counter laxative medicines to help you have bowel movements. Prescription medicines may be prescribed if over-the-counter medicines do not work.  HOME CARE INSTRUCTIONS   Eat foods that have a lot of fiber, such as fruits, vegetables, whole grains, and beans.  Limit foods high in fat and processed sugars, such as french fries, hamburgers, cookies, candies, and soda.   A fiber supplement may be added to your diet if you cannot get enough fiber from foods.   Drink enough fluids to keep your urine clear or pale yellow.   Exercise regularly or as directed by your health care provider.   Go to the restroom when you have the urge to go. Do not hold it.   Only take over-the-counter or prescription medicines as directed by your health care provider. Do not take other medicines for constipation without talking to your health care provider first.  [place] IF:   You have bright red blood in your stool.   Your constipation lasts for more than 4 days or gets worse.   You have abdominal or rectal pain.   You have thin, pencil-like stools.   You have unexplained weight loss. MAKE SURE YOU:   Understand these instructions.  Will watch your condition.  Will get help right away if you are not doing well or get worse. Document Released: 05/17/2004 Document Revised: 08/24/2013 Document Reviewed: 05/31/2013 [facility] Patient Information 2015 [place], [place]. This information is not intended to replace advice given to you by your health care provider. Make sure you discuss any questions you have with your health care provider.

## 2014-04-12 NOTE — MAU Note (Signed)
Patient states she started hurting "real bad" this morning at 2 am, like a cramp. C/o of pressure with urination and decreased fetal movement.

## 2014-04-13 LAB — GC/CHLAMYDIA PROBE AMP
CT PROBE, AMP APTIMA: NEGATIVE
GC Probe RNA: NEGATIVE

## 2014-04-22 ENCOUNTER — Inpatient Hospital Stay (HOSPITAL_COMMUNITY)
Admission: AD | Admit: 2014-04-22 | Discharge: 2014-04-24 | DRG: 775 | Disposition: A | Discharge status: Home / Self Care | Payer: Medicaid Other | Source: Ambulatory Visit | Attending: Obstetrics and Gynecology | Admitting: Obstetrics and Gynecology

## 2014-04-22 ENCOUNTER — Encounter (HOSPITAL_COMMUNITY): Payer: Self-pay | Admitting: *Deleted

## 2014-04-22 ENCOUNTER — Encounter (HOSPITAL_COMMUNITY): Payer: Medicaid Other | Admitting: Anesthesiology

## 2014-04-22 ENCOUNTER — Inpatient Hospital Stay (HOSPITAL_COMMUNITY): Payer: Medicaid Other | Admitting: Anesthesiology

## 2014-04-22 DIAGNOSIS — O479 False labor, unspecified: Secondary | ICD-10-CM | POA: Diagnosis present

## 2014-04-22 DIAGNOSIS — K219 Gastro-esophageal reflux disease without esophagitis: Secondary | ICD-10-CM | POA: Diagnosis present

## 2014-04-22 DIAGNOSIS — Z8249 Family history of ischemic heart disease and other diseases of the circulatory system: Secondary | ICD-10-CM | POA: Diagnosis not present

## 2014-04-22 DIAGNOSIS — O24419 Gestational diabetes mellitus in pregnancy, unspecified control: Secondary | ICD-10-CM | POA: Diagnosis present

## 2014-04-22 DIAGNOSIS — O99814 Abnormal glucose complicating childbirth: Secondary | ICD-10-CM | POA: Diagnosis present

## 2014-04-22 DIAGNOSIS — F3289 Other specified depressive episodes: Secondary | ICD-10-CM | POA: Diagnosis present

## 2014-04-22 DIAGNOSIS — Z91040 Latex allergy status: Secondary | ICD-10-CM | POA: Diagnosis not present

## 2014-04-22 DIAGNOSIS — O9902 Anemia complicating childbirth: Secondary | ICD-10-CM | POA: Diagnosis present

## 2014-04-22 DIAGNOSIS — Z6841 Body Mass Index (BMI) 40.0 and over, adult: Secondary | ICD-10-CM

## 2014-04-22 DIAGNOSIS — O99344 Other mental disorders complicating childbirth: Secondary | ICD-10-CM | POA: Diagnosis present

## 2014-04-22 DIAGNOSIS — IMO0001 Reserved for inherently not codable concepts without codable children: Secondary | ICD-10-CM

## 2014-04-22 DIAGNOSIS — Z825 Family history of asthma and other chronic lower respiratory diseases: Secondary | ICD-10-CM | POA: Diagnosis not present

## 2014-04-22 DIAGNOSIS — Z833 Family history of diabetes mellitus: Secondary | ICD-10-CM

## 2014-04-22 DIAGNOSIS — Z22330 Carrier of Group B streptococcus: Secondary | ICD-10-CM

## 2014-04-22 DIAGNOSIS — O24919 Unspecified diabetes mellitus in pregnancy, unspecified trimester: Secondary | ICD-10-CM | POA: Diagnosis present

## 2014-04-22 DIAGNOSIS — E669 Obesity, unspecified: Secondary | ICD-10-CM | POA: Diagnosis present

## 2014-04-22 DIAGNOSIS — O99892 Other specified diseases and conditions complicating childbirth: Secondary | ICD-10-CM | POA: Diagnosis present

## 2014-04-22 DIAGNOSIS — F329 Major depressive disorder, single episode, unspecified: Secondary | ICD-10-CM | POA: Diagnosis present

## 2014-04-22 DIAGNOSIS — D649 Anemia, unspecified: Secondary | ICD-10-CM | POA: Diagnosis present

## 2014-04-22 DIAGNOSIS — O9989 Other specified diseases and conditions complicating pregnancy, childbirth and the puerperium: Secondary | ICD-10-CM

## 2014-04-22 DIAGNOSIS — F32A Depression, unspecified: Secondary | ICD-10-CM | POA: Diagnosis present

## 2014-04-22 DIAGNOSIS — O99214 Obesity complicating childbirth: Secondary | ICD-10-CM

## 2014-04-22 LAB — CBC
HCT: 32.7 % — ABNORMAL LOW (ref 36.0–46.0)
HCT: 33.5 % — ABNORMAL LOW (ref 36.0–46.0)
Hemoglobin: 11.4 g/dL — ABNORMAL LOW (ref 12.0–15.0)
Hemoglobin: 11.6 g/dL — ABNORMAL LOW (ref 12.0–15.0)
MCH: 29.3 pg (ref 26.0–34.0)
MCH: 29.1 pg (ref 26.0–34.0)
MCHC: 34.9 g/dL (ref 30.0–36.0)
MCHC: 34.6 g/dL (ref 30.0–36.0)
MCV: 84.1 fL (ref 78.0–100.0)
MCV: 84 fL (ref 78.0–100.0)
PLATELETS: 198 10*3/uL (ref 150–400)
PLATELETS: 208 10*3/uL (ref 150–400)
RBC: 3.89 MIL/uL (ref 3.87–5.11)
RBC: 3.99 MIL/uL (ref 3.87–5.11)
RDW: 12.1 % (ref 11.5–15.5)
RDW: 12.1 % (ref 11.5–15.5)
WBC: 6.2 10*3/uL (ref 4.0–10.5)
WBC: 5.8 10*3/uL (ref 4.0–10.5)

## 2014-04-22 LAB — TYPE AND SCREEN
ABO/RH(D): A POS
Antibody Screen: NEGATIVE

## 2014-04-22 LAB — GLUCOSE, CAPILLARY
GLUCOSE-CAPILLARY: 92 mg/dL (ref 70–99)
Glucose-Capillary: 102 mg/dL — ABNORMAL HIGH (ref 70–99)
Glucose-Capillary: 72 mg/dL (ref 70–99)
Glucose-Capillary: 88 mg/dL (ref 70–99)
Glucose-Capillary: 94 mg/dL (ref 70–99)

## 2014-04-22 LAB — COMPREHENSIVE METABOLIC PANEL
ALBUMIN: 3.1 g/dL — AB (ref 3.5–5.2)
ALT: 8 U/L (ref 0–35)
AST: 14 U/L (ref 0–37)
Alkaline Phosphatase: 125 U/L — ABNORMAL HIGH (ref 39–117)
Anion gap: 15 (ref 5–15)
BUN: 11 mg/dL (ref 6–23)
CO2: 20 mEq/L (ref 19–32)
CREATININE: 0.75 mg/dL (ref 0.50–1.10)
Calcium: 9.1 mg/dL (ref 8.4–10.5)
Chloride: 99 mEq/L (ref 96–112)
GFR calc Af Amer: >90 mL/min (ref >90)
Glucose, Bld: 107 mg/dL — ABNORMAL HIGH (ref 70–99)
Potassium: 4.3 mEq/L (ref 3.7–5.3)
Sodium: 134 mEq/L — ABNORMAL LOW (ref 137–147)
TOTAL PROTEIN: 6.2 g/dL (ref 6.0–8.3)
Total Bilirubin: <0.2 mg/dL — ABNORMAL LOW (ref 0.3–1.2)

## 2014-04-22 LAB — PROTEIN / CREATININE RATIO, URINE
CREATININE, URINE: 33.76 mg/dL
PROTEIN CREATININE RATIO: 0.29 — AB (ref 0.00–0.15)
Total Protein, Urine: 9.8 mg/dL

## 2014-04-22 LAB — URIC ACID: URIC ACID, SERUM: 4.7 mg/dL (ref 2.4–7.0)

## 2014-04-22 LAB — LACTATE DEHYDROGENASE: LDH: 187 U/L (ref 94–250)

## 2014-04-22 LAB — RPR

## 2014-04-22 LAB — ABO/RH: ABO/RH(D): A POS

## 2014-04-22 MED ORDER — EPHEDRINE 5 MG/ML INJ
10.0000 mg | INTRAVENOUS | Status: DC | PRN
  Filled 2014-04-22: qty 2

## 2014-04-22 MED ORDER — OXYCODONE-ACETAMINOPHEN 5-325 MG PO TABS: 1.0000 | ORAL_TABLET | ORAL | Status: DC | PRN

## 2014-04-22 MED ORDER — DIPHENHYDRAMINE HCL 50 MG/ML IJ SOLN: 12.5000 mg | INTRAMUSCULAR | Status: DC | PRN

## 2014-04-22 MED ORDER — LIDOCAINE HCL (PF) 1 % IJ SOLN
INTRAMUSCULAR | Status: DC | PRN
  Administered 2014-04-22 (×2): 4 mL

## 2014-04-22 MED ORDER — LIDOCAINE HCL (PF) 1 % IJ SOLN
30.0000 mL | INTRAMUSCULAR | Status: DC | PRN
  Filled 2014-04-22: qty 30

## 2014-04-22 MED ORDER — ACETAMINOPHEN 325 MG PO TABS
650.0000 mg | ORAL_TABLET | ORAL | Status: DC | PRN
  Administered 2014-04-22: 650 mg via ORAL
  Filled 2014-04-22: qty 2

## 2014-04-22 MED ORDER — ONDANSETRON HCL 4 MG/2ML IJ SOLN
4.0000 mg | Freq: Four times a day (QID) | INTRAMUSCULAR | Status: DC | PRN
  Administered 2014-04-22 – 2014-04-23 (×2): 4 mg via INTRAVENOUS
  Filled 2014-04-22 (×2): qty 2

## 2014-04-22 MED ORDER — FENTANYL 2.5 MCG/ML BUPIVACAINE 1/10 % EPIDURAL INFUSION (WH - ANES)
INTRAMUSCULAR | Status: DC | PRN
  Administered 2014-04-22: 14 mL/h via EPIDURAL

## 2014-04-22 MED ORDER — CITRIC ACID-SODIUM CITRATE 334-500 MG/5ML PO SOLN: 30.0000 mL | ORAL | Status: DC | PRN

## 2014-04-22 MED ORDER — NALBUPHINE HCL 10 MG/ML IJ SOLN: 10.0000 mg | INTRAMUSCULAR | Status: DC | PRN

## 2014-04-22 MED ORDER — LACTATED RINGERS IV SOLN
INTRAVENOUS | Status: DC
  Administered 2014-04-22 (×2): via INTRAVENOUS

## 2014-04-22 MED ORDER — OXYTOCIN 40 UNITS IN LACTATED RINGERS INFUSION - SIMPLE MED: 62.5000 mL/h | INTRAVENOUS | Status: DC

## 2014-04-22 MED ORDER — PENICILLIN G POTASSIUM 5000000 UNITS IJ SOLR
5.0000 10*6.[IU] | Freq: Once | INTRAVENOUS | Status: AC
  Administered 2014-04-22: 5 10*6.[IU] via INTRAVENOUS
  Filled 2014-04-22: qty 5

## 2014-04-22 MED ORDER — PHENYLEPHRINE 40 MCG/ML (10ML) SYRINGE FOR IV PUSH (FOR BLOOD PRESSURE SUPPORT)
80.0000 ug | PREFILLED_SYRINGE | INTRAVENOUS | Status: DC | PRN
  Filled 2014-04-22: qty 2

## 2014-04-22 MED ORDER — IBUPROFEN 600 MG PO TABS
600.0000 mg | ORAL_TABLET | Freq: Four times a day (QID) | ORAL | Status: DC | PRN
  Administered 2014-04-23: 600 mg via ORAL
  Filled 2014-04-22: qty 1

## 2014-04-22 MED ORDER — OXYTOCIN BOLUS FROM INFUSION
500.0000 mL | INTRAVENOUS | Status: DC
Start: 2014-04-22 — End: 2014-04-23

## 2014-04-22 MED ORDER — PENICILLIN G POTASSIUM 5000000 UNITS IJ SOLR
2.5000 10*6.[IU] | INTRAVENOUS | Status: DC
  Administered 2014-04-22 (×3): 2.5 10*6.[IU] via INTRAVENOUS
  Filled 2014-04-22 (×8): qty 2.5

## 2014-04-22 MED ORDER — FENTANYL CITRATE 0.05 MG/ML IJ SOLN: 100.0000 ug | INTRAMUSCULAR | Status: DC | PRN

## 2014-04-22 MED ORDER — FENTANYL 2.5 MCG/ML BUPIVACAINE 1/10 % EPIDURAL INFUSION (WH - ANES)
14.0000 mL/h | INTRAMUSCULAR | Status: DC | PRN
  Administered 2014-04-22 (×2): 14 mL/h via EPIDURAL
  Filled 2014-04-22 (×2): qty 125

## 2014-04-22 MED ORDER — PHENYLEPHRINE 40 MCG/ML (10ML) SYRINGE FOR IV PUSH (FOR BLOOD PRESSURE SUPPORT)
80.0000 ug | PREFILLED_SYRINGE | INTRAVENOUS | Status: DC | PRN
  Filled 2014-04-22: qty 10
  Filled 2014-04-22: qty 2

## 2014-04-22 MED ORDER — LACTATED RINGERS IV SOLN: 500.0000 mL | INTRAVENOUS | Status: DC | PRN

## 2014-04-22 MED ORDER — OXYTOCIN 40 UNITS IN LACTATED RINGERS INFUSION - SIMPLE MED
1.0000 m[IU]/min | INTRAVENOUS | Status: DC
  Administered 2014-04-22: 2 m[IU]/min via INTRAVENOUS
  Filled 2014-04-22: qty 1000

## 2014-04-22 MED ORDER — LACTATED RINGERS IV SOLN
500.0000 mL | Freq: Once | INTRAVENOUS | Status: AC
  Administered 2014-04-22: 500 mL via INTRAVENOUS

## 2014-04-22 MED ORDER — TERBUTALINE SULFATE 1 MG/ML IJ SOLN: 0.2500 mg | Freq: Once | INTRAMUSCULAR | Status: AC | PRN

## 2014-04-22 NOTE — Progress Notes (Signed)
Labor Progress  Subjective: Pt frustrated with progress, asking if she can just have the "pill".  When asked what pill she described PV Cytotec.  Labor progress and the indication for Cytotec was reviewed.    Objective: BP 122/83  Pulse 82  Temp(Src) 97.8 F (36.6 C) (Oral)  Resp 20  Ht 5' 3.5" (1.613 m)  Wt 243 lb (110.224 kg)  BMI 42.37 kg/m2      Filed Vitals:   04/22/14 1301 04/22/14 1330 04/22/14 1400 04/22/14 1430  BP: 144/82 146/87 125/93 122/83  Pulse: 94 91 87 82  Temp:   97.8 F (36.6 C)   TempSrc:   Oral   Resp: 20 18 20 20   Height:      Weight:       PIH Labs:   Uric Acid 4.7  AST 14  ALT 8  LDH 187  PLT 198  PCR pending  NICHD: Category 1 CTX:   irregular Uterus gravid, soft non tender SVE:   Dilation: 5 Effacement (%): 60 Station: -2 Exam by:: Belen Zwahlen, CNM Extremities: WNL Pitocin at 29mu AROM at 1445 IUPC place  Assessment:  IUP at 37.1 weeks Membranes: AROM clear Cervix: 5/60/-2 Labor progress: prodromal labor GBS: positive  Plan: Continue labor plan Rest/Ambulate Continuous monitoring Continue pitocin per protocol   Jiovany Scheffel, CNM, MSN 04/22/2014. 3:12 PM

## 2014-04-22 NOTE — Progress Notes (Signed)
Labor Progress  Subjective: Feels ctx not painful.  Pt looks sad.  When asked if there was something wrong she shared that a family member was found dead last night from unknown causes.    Objective: BP 140/87  Pulse 78  Temp(Src) 97.4 F (36.3 C) (Oral)  Resp 20  Ht 5' 3.5" (1.613 m)  Wt 243 lb (110.224 kg)  BMI 42.37 kg/m2      NICHD: Category 1 CTX:   irregular, every 6-9 minutes Uterus gravid, soft non tender SVE:   Dilation: 4.5 Effacement (%): 60 Station: Ballotable Exam by:: [employer], CNM Extremities: WNL   Assessment:  IUP at 37.1 weeks Membranes: intact Cervix: 4-5/60/-2-3 Labor progress: early active labor GBS: positive  Plan: Continue labor plan Rest/ambulate PRN Continuous monitoring Continue PCN q4 CBG per protocol   Augmentation options reviewed with patient including foley bulb, AROM, and pitocin Pt and family verbalize understanding and agree with treatment plan Bishop Score: 9 Start pitocin at 39mu and increase by 28mu Anticipate SVD IV pain medication PRN  Epidural per pt request Foley after epidural placement   Javen Hinderliter, CNM, MSN 04/22/2014. 9:54 AM

## 2014-04-22 NOTE — H&P (Signed)
[name] is a 33 y.o. female, G3P1011 at 37.1 weeks, presenting for contractions.  Patient states that she was awakened out of sleep, at 0330, due to pain.  Patient unsure of whether she was having contractions or if was just pain. Patient also having nausea and vomiting.  Reports active fetus, denies LOF, & VB.   Patient Active Problem List   Diagnosis Date Noted  . Constipation 03/11/2014  . Depression--has been referred to counseling, on Zoloft. 02/20/2014  . Gestational diabetes 11/29/2013  . 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 Nausea and vomiting in pregnancy 09/27/2013  . Ptyalism 09/27/2013    History of present pregnancy: Patient entered care at 8.1 weeks.   EDC of 05/12/2014 was established by LMP of 08/05/2013 and 6wk [place] on 09/16/2013.   Anatomy scan:  18.4 weeks, with normal findings, but limited spine views and an anterior placenta.   Additional [place] evaluations:  Anatomy F/U at 22.4wks-Complete,  Growth [place] fr S>D: 29wks: EFW: 3lb 9 oz 91.2% cx length: 3.52 cm cx closed Anterior placenta AFI 18.61 cm 70TH%tile; BPP at 32.4wks:  AUA 34.2weeks, AFI 13.3-40%tile, anterior placenta, BPP 6/8 (breathing), cervix closed; 34.1wks BPP 8/10, BPP 35wks 10/10, Growth [place] for S>D: EFW 8LBS 4 OZ (3753 GRAMS) >45YK%, CEPHALIC, AFI 99.8PJ. BPP 8/8. WILL DO NST TODAY: 150 BL, MOD VAR, REACTIVE, BPP 36.4wks 8/8. Significant prenatal events:  Patient experienced hyperemesis at beginning of pregnancy.  Started on zoloft for depression.  Patient also diagnosed with GDM and is currently non-compliant.  Patient with many complaints throughout the pregnancy.  Last evaluation:  04/18/2014 by L. Eulas Post, FNP--1/40/-2  OB History   Grav Para Term Preterm Abortions TAB SAB Ect Mult Living   3 2 2  0 0 0 0 0 0 2     Past Medical History  Diagnosis Date  . Urinary tract infection   . Trichomonas 02/2011    treated  . Sinusitis   . Gestational diabetes   .  Abnormal Pap smear   . H/O varicella   . GDM (gestational diabetes mellitus)   . Hyperemesis     During first pregnancy  . Hx of bacterial infection   . Asthma   . Depression   . Vaginal Pap smear, abnormal    Past Surgical History  Procedure Laterality Date  . No past surgeries     Family History: family history includes Alcohol abuse in her maternal aunt; Asthma in her mother; Cancer in her maternal uncle; Diabetes in her father and mother; Heart attack in her maternal uncle and paternal uncle; Hypertension in her father and mother; Kidney disease in her maternal uncle and paternal uncle; Peripheral vascular disease in her mother; Seizures in her maternal aunt; Thyroid disease in her mother and paternal aunt. There is no history of Anesthesia problems, Hypotension, Malignant hyperthermia, or Pseudochol deficiency. Social History:  reports that she has never smoked. She has never used smokeless tobacco. She reports that she does not drink alcohol or use illicit drugs.   Prenatal Transfer Tool  Maternal Diabetes: Yes:  Diabetes Type:  Diet controlled-Non compliant Genetic Screening: Normal Maternal Ultrasounds/Referrals: Normal Fetal Ultrasounds or other Referrals:  None Maternal Substance Abuse:  No Significant Maternal Medications:  Meds include: Zoloft Protonix Significant Maternal Lab Results: Lab values include: Group B Strep positive    ROS:  See HPI Above  Allergies  Allergen Reactions  . Flagyl [Metronidazole Hcl] Swelling  . Latex Hives  .  Other Other (See Comments)    Pt has a reaction to plastic tape. She breaks out with a rash and her skin peels.     Chest clear Heart RRR without murmur Abd gravid, NT Pelvic: 5/60/-2, No Bloody Show Ext: WNL  FHR: 145 bpm, Mod Var, -Decels, +Accels UCs:  Q2-1min, graphs mild, palpates moderate to strong  Prenatal labs: ABO, Rh:  A Positive Antibody:  Negative Rubella:   Immune RPR:   Negative HBsAg:   Negative HIV:    Negative GBS: Positive (08/06 0000) Sickle cell/Hgb electrophoresis: Normal   Pap:  Unknown GC:  Negative Chlamydia:  Negative Genetic screenings:  Normal Glucola:  Abnormal--A1C 6.1/6.0 Other:  TSHs-WNL    Assessment IUP at 37.1wks Cat I FT Active Labor GBS Positive GDM  Plan: Admit to [facility] per consult with Dr. N.Dillard Routine Labor and Delivery Orders Start PCN for GBS prophylaxis CBGs Q4HR Okay for Epidural  Jolyne Laye LYNNCNM, MSN 04/22/2014, 5:10 AM

## 2014-04-22 NOTE — Progress Notes (Signed)
CBG 88, result did not flow over to pt's flowsheet once machine replaced to dock.

## 2014-04-22 NOTE — Anesthesia Procedure Notes (Signed)
Epidural Patient location during procedure: OB Start time: 04/22/2014 5:02 PM  Staffing Anesthesiologist: Larwence Tu A. Performed by: anesthesiologist   Preanesthetic Checklist Completed: patient identified, site marked, surgical consent, pre-op evaluation, timeout performed, IV checked, risks and benefits discussed and monitors and equipment checked  Epidural Patient position: sitting Prep: site prepped and draped and DuraPrep Patient monitoring: continuous pulse ox and blood pressure Approach: midline Location: L3-L4 Injection technique: LOR air  Needle:  Needle type: Tuohy  Needle gauge: 17 G Needle length: 9 cm and 9 Needle insertion depth: 7 cm Catheter type: closed end flexible Catheter size: 19 Gauge Catheter at skin depth: 12 cm Test dose: negative and Other  Assessment Events: blood not aspirated, injection not painful, no injection resistance, negative IV test and no paresthesia  Additional Notes Patient identified. Risks and benefits discussed including failed block, incomplete  Pain control, post dural puncture headache, nerve damage, paralysis, blood pressure Changes, nausea, vomiting, reactions to medications-both toxic and allergic and post Partum back pain. All questions were answered. Patient expressed understanding and wished to proceed. Sterile technique was used throughout procedure. Epidural site was Dressed with sterile barrier dressing. No paresthesias, signs of intravascular injection Or signs of intrathecal spread were encountered.  Patient was more comfortable after the epidural was dosed. Please see RN's note for documentation of vital signs and FHR which are stable.

## 2014-04-22 NOTE — Anesthesia Preprocedure Evaluation (Signed)
Anesthesia Evaluation  Patient identified by MRN, date of birth, ID band Patient awake    Reviewed: Allergy & Precautions, H&P , Patient's Chart, lab work & pertinent test results  Airway Mallampati: III TM Distance: >3 FB Neck ROM: Full    Dental no notable dental hx. (+) Teeth Intact   Pulmonary asthma ,  breath sounds clear to auscultation  Pulmonary exam normal       Cardiovascular negative cardio ROS  Rhythm:Regular Rate:Normal     Neuro/Psych PSYCHIATRIC DISORDERS Depression negative neurological ROS     GI/Hepatic Neg liver ROS, GERD-  Medicated and Controlled,  Endo/Other  diabetes, Well Controlled, GestationalMorbid obesity  Renal/GU negative Renal ROS  negative genitourinary   Musculoskeletal negative musculoskeletal ROS (+)   Abdominal (+) + obese,   Peds  Hematology  (+) anemia ,   Anesthesia Other Findings   Reproductive/Obstetrics (+) Pregnancy                           Anesthesia Physical Anesthesia Plan  ASA: III  Anesthesia Plan: Epidural   Post-op Pain Management:    Induction:   Airway Management Planned: Natural Airway  Additional Equipment:   Intra-op Plan:   Post-operative Plan:   Informed Consent: I have reviewed the patients History and Physical, chart, labs and discussed the procedure including the risks, benefits and alternatives for the proposed anesthesia with the patient or authorized representative who has indicated his/her understanding and acceptance.   Dental advisory given  Plan Discussed with: Anesthesiologist  Anesthesia Plan Comments:         Anesthesia Quick Evaluation

## 2014-04-22 NOTE — Progress Notes (Signed)
Labor Progress  Subjective: Laying on the left side.  Feeling ok "I just want this baby out"    Objective: BP 131/84  Pulse 89  Temp(Src) 98 F (36.7 C) (Oral)  Resp 20  Ht 5' 3.5" (1.613 m)  Wt 243 lb (110.224 kg)  BMI 42.37 kg/m2     NICHD: Category 1 CTX:   Irregular, occassional Uterus gravid, soft non tender SVE:   Dilation: 4.5 Effacement (%): 60 Station: -2 Exam by:: J.Emly, CNM Extremities: WNL   Assessment:  IUP at 37.1 weeks Membranes: intact Cervix: 4-5/60/-2 Labor progress: early active GBS: positive  Plan: Continue labor plan Rest/Ambulate Continuous monitoring Check progress in 2 hours   [name], CNM, MSN 04/22/2014. 8:41 AM

## 2014-04-22 NOTE — Progress Notes (Signed)
Labor Progress  Subjective: Felling much better with an epidural.    Objective: BP 112/67  Pulse 80  Temp(Src) 97.7 F (36.5 C) (Oral)  Resp 18  Ht 5' 3.5" (1.613 m)  Wt 243 lb (110.224 kg)  BMI 42.37 kg/m2  SpO2 98% I/O last 3 completed shifts: In: -  Out: 525 [Urine:525]    NICHD: Category 1 CTX:   regular, every 3 minutes Uterus gravid, soft non tender SVE:   Dilation: 5.5 Effacement (%): 60 Station: -2 Exam by:: Garris Melhorn, CNM Extremities: WNL Pitocin at 12  Assessment:  IUP at 37.1 weeks Membranes: AROM clear at 1445 Cervix: 5-6/60/-2 Labor progress: active GBS: positive Foley placed IUPC replaced  Plan: Continue labor plan Pitocin per protocol Continuous monitoring   Markus Casten, CNM, MSN 04/22/2014. 7:45 PM

## 2014-04-22 NOTE — MAU Note (Signed)
Pt reports uc's for 30 minutes

## 2014-04-22 NOTE — Progress Notes (Signed)
  Subjective: -In room to assess patient as she has had bowel movement, unknowingly. Patient reporting that she is tired and just wants to be done.   Objective: BP 117/67  Pulse 81  Temp(Src) 97.6 F (36.4 C) (Oral)  Resp 18  Ht 5' 3.5" (1.613 m)  Wt 243 lb (110.224 kg)  BMI 42.37 kg/m2  SpO2 98% I/O last 3 completed shifts: In: -  Out: 525 [Urine:525]   FHT:  120 bpm, Mod Var, -Decels, +Accels UC:   Q1-24min, palpates moderate. MVUs 165 SVE:   10/100/+3 Membranes:AROM Pitocin: 38mUn/min Epidural  Assessment:  IUP at 37.1wks Cat I FT  GBS Positive Transition  Plan: -Complete dilation, will start pushing -Dr. Carmon Sails on standby -Anticipate SVD  Darien Ramus, MSN 04/22/2014, 9:57 PM

## 2014-04-22 NOTE — Progress Notes (Signed)
Addendum  Epidural placed at 5:10 Reassess in 20 minutes

## 2014-04-22 NOTE — Progress Notes (Signed)
Labor Progress  Subjective: "I'm sort of feeling ok, but my back hurts and I have a slight HA"  Objective: BP 144/84  Pulse 79  Temp(Src) 97.9 F (36.6 C) (Oral)  Resp 20  Ht 5' 3.5" (1.613 m)  Wt 243 lb (110.224 kg)  BMI 42.37 kg/m2      Filed Vitals:   04/22/14 1059 04/22/14 1130 04/22/14 1200 04/22/14 1231  BP: 121/82 121/84 139/92 144/84  Pulse: 90 86 92 79  Temp:    97.9 F (36.6 C)  TempSrc:    Oral  Resp: 20 18 20 20   Height:      Weight:        NICHD: Category 1 CTX:   regular, every 4-5 minutes Uterus gravid, soft non tender SVE:   Dilation: 4.5 Effacement (%): 60 Station: Ballotable Exam by:: Darden [employer]aur[employer], CNM Extremities: WNL Pitocin at 6 Unable to AROM still ballotable  Assessment:  IUP at 37.1 weeks  Membranes: intact Cervix: unchanged Labor progress: protracted labor GBS: positive PIH  Plan: Continue labor plan Rest/Ambulate Continious monitoring Increase pitocin per protocol PIH labs   Darden [employer]aurants, CNM, MSN 04/22/2014. 12:32 PM

## 2014-04-23 ENCOUNTER — Encounter (HOSPITAL_COMMUNITY): Payer: Self-pay | Admitting: *Deleted

## 2014-04-23 LAB — CBC
HCT: 29.5 % — ABNORMAL LOW (ref 36.0–46.0)
Hemoglobin: 10 g/dL — ABNORMAL LOW (ref 12.0–15.0)
MCH: 28.7 pg (ref 26.0–34.0)
MCHC: 33.9 g/dL (ref 30.0–36.0)
MCV: 84.5 fL (ref 78.0–100.0)
Platelets: 162 10*3/uL (ref 150–400)
RBC: 3.49 MIL/uL — AB (ref 3.87–5.11)
RDW: 12.2 % (ref 11.5–15.5)
WBC: 14.2 10*3/uL — ABNORMAL HIGH (ref 4.0–10.5)

## 2014-04-23 LAB — COMPREHENSIVE METABOLIC PANEL
ALBUMIN: 2.8 g/dL — AB (ref 3.5–5.2)
ALT: 9 U/L (ref 0–35)
ANION GAP: 16 — AB (ref 5–15)
AST: 17 U/L (ref 0–37)
Alkaline Phosphatase: 120 U/L — ABNORMAL HIGH (ref 39–117)
BUN: 8 mg/dL (ref 6–23)
CHLORIDE: 99 meq/L (ref 96–112)
CO2: 20 mEq/L (ref 19–32)
CREATININE: 0.69 mg/dL (ref 0.50–1.10)
Calcium: 9.2 mg/dL (ref 8.4–10.5)
GFR calc Af Amer: >90 mL/min (ref >90)
GFR calc non Af Amer: >90 mL/min (ref >90)
Glucose, Bld: 195 mg/dL — ABNORMAL HIGH (ref 70–99)
Potassium: 4.1 mEq/L (ref 3.7–5.3)
Sodium: 135 mEq/L — ABNORMAL LOW (ref 137–147)
TOTAL PROTEIN: 5.8 g/dL — AB (ref 6.0–8.3)
Total Bilirubin: 0.3 mg/dL (ref 0.3–1.2)

## 2014-04-23 LAB — URIC ACID: Uric Acid, Serum: 5.3 mg/dL (ref 2.4–7.0)

## 2014-04-23 LAB — LACTATE DEHYDROGENASE: LDH: 216 U/L (ref 94–250)

## 2014-04-23 MED ORDER — SERTRALINE HCL 100 MG PO TABS
100.0000 mg | ORAL_TABLET | Freq: Every day | ORAL | Status: DC
  Administered 2014-04-23 – 2014-04-24 (×2): 100 mg via ORAL
  Filled 2014-04-23 (×2): qty 1

## 2014-04-23 MED ORDER — LANOLIN HYDROUS EX OINT: TOPICAL_OINTMENT | CUTANEOUS | Status: DC | PRN

## 2014-04-23 MED ORDER — BENZOCAINE-MENTHOL 20-0.5 % EX AERO
1.0000 | INHALATION_SPRAY | CUTANEOUS | Status: DC | PRN
Start: 2014-04-23 — End: 2014-04-24
  Filled 2014-04-23: qty 56

## 2014-04-23 MED ORDER — DIPHENHYDRAMINE HCL 25 MG PO CAPS: 25.0000 mg | ORAL_CAPSULE | Freq: Four times a day (QID) | ORAL | Status: DC | PRN

## 2014-04-23 MED ORDER — SENNOSIDES-DOCUSATE SODIUM 8.6-50 MG PO TABS
2.0000 | ORAL_TABLET | ORAL | Status: DC
Start: 2014-04-24 — End: 2014-04-24
  Administered 2014-04-23: 2 via ORAL
  Filled 2014-04-23: qty 2

## 2014-04-23 MED ORDER — ONDANSETRON HCL 4 MG PO TABS: 4.0000 mg | ORAL_TABLET | ORAL | Status: DC | PRN

## 2014-04-23 MED ORDER — SIMETHICONE 80 MG PO CHEW
80.0000 mg | CHEWABLE_TABLET | ORAL | Status: DC | PRN
Start: 2014-04-23 — End: 2014-04-24

## 2014-04-23 MED ORDER — OXYCODONE-ACETAMINOPHEN 5-325 MG PO TABS
1.0000 | ORAL_TABLET | ORAL | Status: DC | PRN
  Administered 2014-04-23: 1 via ORAL
  Filled 2014-04-23 (×2): qty 1

## 2014-04-23 MED ORDER — FERROUS SULFATE 325 (65 FE) MG PO TABS
325.0000 mg | ORAL_TABLET | Freq: Two times a day (BID) | ORAL | Status: DC
  Administered 2014-04-23 – 2014-04-24 (×3): 325 mg via ORAL
  Filled 2014-04-23 (×3): qty 1

## 2014-04-23 MED ORDER — IBUPROFEN 600 MG PO TABS
600.0000 mg | ORAL_TABLET | Freq: Four times a day (QID) | ORAL | Status: DC
  Administered 2014-04-23 – 2014-04-24 (×6): 600 mg via ORAL
  Filled 2014-04-23 (×7): qty 1

## 2014-04-23 MED ORDER — PANTOPRAZOLE SODIUM 40 MG PO TBEC
40.0000 mg | DELAYED_RELEASE_TABLET | Freq: Every day | ORAL | Status: DC
  Administered 2014-04-23 – 2014-04-24 (×2): 40 mg via ORAL
  Filled 2014-04-23 (×2): qty 1

## 2014-04-23 MED ORDER — WITCH HAZEL-GLYCERIN EX PADS: 1.0000 "application " | MEDICATED_PAD | CUTANEOUS | Status: DC | PRN

## 2014-04-23 MED ORDER — PRENATAL MULTIVITAMIN CH
1.0000 | ORAL_TABLET | Freq: Every day | ORAL | Status: DC
  Administered 2014-04-23 – 2014-04-24 (×2): 1 via ORAL
  Filled 2014-04-23 (×2): qty 1

## 2014-04-23 MED ORDER — TETANUS-DIPHTH-ACELL PERTUSSIS 5-2.5-18.5 LF-MCG/0.5 IM SUSP
0.5000 mL | Freq: Once | INTRAMUSCULAR | Status: AC
  Administered 2014-04-23: 0.5 mL via INTRAMUSCULAR
  Filled 2014-04-23: qty 0.5

## 2014-04-23 MED ORDER — ZOLPIDEM TARTRATE 5 MG PO TABS: 5.0000 mg | ORAL_TABLET | Freq: Every evening | ORAL | Status: DC | PRN

## 2014-04-23 MED ORDER — ONDANSETRON HCL 4 MG/2ML IJ SOLN: 4.0000 mg | INTRAMUSCULAR | Status: DC | PRN

## 2014-04-23 MED ORDER — DIBUCAINE 1 % RE OINT: 1.0000 "application " | TOPICAL_OINTMENT | RECTAL | Status: DC | PRN

## 2014-04-23 NOTE — Progress Notes (Signed)
Subjective: Postpartum Day 0: Vaginal delivery, 1st degree perineal laceration Patient up ad lib, reports no syncope or dizziness. Foley placed due to difficulty voiding--to be removed at 12:30p Feeding:  Bottle Contraceptive plan:  Nexplanon Plans outpatient circ.  Objective: Vital signs in last 24 hours: Temp:  [97.6 F (36.4 C)-98.4 F (36.9 C)] 97.9 F (36.6 C) (08/22 0545) Pulse Rate:  [70-105] 82 (08/22 0545) Resp:  [18-20] 20 (08/22 0545) BP: (112-157)/(57-97) 127/84 mmHg (08/22 0545) SpO2:  [96 %-100 %] 100 % (08/22 0545)  Physical Exam:  General: alert Lochia: appropriate Uterine Fundus: firm Perineum: healing well DVT Evaluation: No evidence of DVT seen on physical exam. Negative Homan's sign. Foley draining dark yellow urine, quantity WNL    Recent Labs  04/22/14 1550 04/23/14 0630  HGB 11.6* 10.0*  HCT 33.5* 29.5*    Assessment/Plan: Status post vaginal delivery day 0. Stable Continue current care. D/C foley as planned--observe for spontaneous voiding.    Allena Katz 04/23/2014, 10:45 AM

## 2014-04-23 NOTE — Progress Notes (Signed)
  Subjective: -Dr. Carmon Sails in house and at bedside for prolonged decels during contractions. Discussed options, including usage of vacuum, continue to push, and laboring down.  Patient opt to labor down and encouraged to rest.   Objective: BP 126/84  Pulse 105  Temp(Src) 98.4 F (36.9 C) (Axillary)  Resp 18  Ht 5' 3.5" (1.613 m)  Wt 243 lb (110.224 kg)  BMI 42.37 kg/m2  SpO2 98% I/O last 3 completed shifts: In: -  Out: 525 [Urine:525]   FHT: 135 bpm, Mod Var, -Decels, +Accels UC:   Q2-23minutes SVE:   Dilation: 10 Effacement (%): 100 Station: +3 Exam by:: J. Jalessa Peyser, cnm Membranes: AROM Pitocin:68mUn/min  Assessment:  IUP at 37.1wks Cat I FT  Transition Phase Maternal Exhaustion Fetal Malpresentation-OP  Plan: -Allow to rest and labor down for 61minutes -Continue other mgmt as ordered   [facility], [name],CNM, MSN 04/22/2014, 11:28 PM

## 2014-04-23 NOTE — Anesthesia Postprocedure Evaluation (Signed)
Anesthesia Post Note  Patient: [name]  Procedure(s) Performed: * No procedures listed *  Anesthesia type: Epidural  Patient location: Mother/Baby  Post pain: Pain level controlled  Post assessment: Post-op Vital signs reviewed  Last Vitals:  Filed Vitals:   04/23/14 0545  BP: 127/84  Pulse: 82  Temp: 36.6 C  Resp: 20    Post vital signs: Reviewed  Level of consciousness:alert  Complications: No apparent anesthesia complications

## 2014-04-23 NOTE — Lactation Note (Signed)
This note was copied from the chart of [place]. Lactation Consultation Note  Patient Name: [name] KGYJE'H Date: 04/23/2014 Reason for consult: Follow-up assessment Mom reports she has decided to breast and bottle feed. Baby asleep at this visit, so did not help with latch. [place] reviewed with Mom the importance of breastfeeding with each feeding before giving any supplements. Guidelines for supplementing with breastfeeding reviewed with Mom. Basic teaching reviewed, advised Mom that once baby is over 60 hours old should be at the breast 8-12 times in 24 hours for 15-20 minutes. Lactation brochure left for review, advised of OP services and support group. Encouraged to call if she would like assist with latch, or has questions/concerns. Mom bottle fed her older children.   Maternal Data    Feeding Feeding Type: Formula Nipple Type: Regular  LATCH Score/Interventions                      Lactation Tools Discussed/Used Tools: Pump Breast pump type: Manual   Consult Status Consult Status: Follow-up Date: 04/24/14 Follow-up type: In-patient    Katrine Coho 04/23/2014, 8:41 PM

## 2014-04-24 LAB — PROTEIN / CREATININE RATIO, URINE
CREATININE, URINE: 137.07 mg/dL
PROTEIN CREATININE RATIO: 0.21 — AB (ref 0.00–0.15)
TOTAL PROTEIN, URINE: 28.1 mg/dL

## 2014-04-24 MED ORDER — OXYCODONE-ACETAMINOPHEN 5-325 MG PO TABS: 1.0000 | ORAL_TABLET | ORAL | Status: DC | PRN

## 2014-04-24 MED ORDER — IBUPROFEN 600 MG PO TABS: 600.0000 mg | ORAL_TABLET | Freq: Four times a day (QID) | ORAL | Status: DC | PRN

## 2014-04-24 NOTE — Progress Notes (Signed)
Clinical Social Work Department PSYCHOSOCIAL ASSESSMENT - MATERNAL/CHILD 04/24/2014  Patient:  [name], [name]  Account Number:  [id number]  [place] Date:  04/22/2014  Ardine Eng Name:   [name]    Clinical Social Worker:  Duard Spiewak, LCSW   Date/Time:  04/24/2014 12:00 N  Date Referred:  04/24/2014   Referral source  Central Nursery     Referred reason  Depression/Anxiety   Other referral source:    I:  FAMILY / [place] legal guardian:  PARENT  Guardian - Name Guardian - Age Guardian - Address  [name], [name] 62 [address]  [place], [place] [address]  Lang Snow     Other household support members/support persons Other support:    II  PSYCHOSOCIAL DATA Information Source:    Occupational hygienist Employment:   Both parents employed   Museum/gallery curator resources:  [employer] [place]:   Other  [place] / Grade:   Maternity Care Coordinator / Child Services Coordination / Early Interventions:  Cultural issues impacting care:    III  STRENGTHS Strengths  Supportive family/friends  Home prepared for Child (including basic supplies)  Adequate Resources   Strength comment:    IV  RISK FACTORS AND CURRENT PROBLEMS Current Problem:       V  SOCIAL WORK ASSESSMENT Acknowledged order for Social Work consult to assess mother's history of depression.  Parents are not married, but maintains a supportive relationship. Mother has two other dependents ages 3 and 29.  Informed that she experienced her first episode of depression during this pregnancy.  Informed that the pregnancy was stressful and it didn't help that she was having a 3rd boy.  Informed that she is being prescribed Zoloft and that along with therapy has been helpful.  She communicate intent to continue therapy and medication.  Parents report adequate family support.   She denies any hx of substance abuse. Spoke with the family about the signs/symptoms of PP Depession.  No  acute social concerns related at this time. 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      VI SOCIAL WORK PLAN Social Work Plan  No Further Intervention Required / No Barriers to Discharge   Type of pt/family education:   PP Depression signs/symptoms

## 2014-04-24 NOTE — Discharge Summary (Signed)
  Vaginal Delivery Discharge Summary  [name]  DOB:    1980/11/29 MRN:    [id number] CSN:    [id number]  Date of admission:                  04/22/14  Date of discharge:                   04/24/14  Procedures this admission:   SVB  Date of Delivery: 04/23/14  Newborn Data:  Live born female  Birth Weight: 7 lb 14.8 oz (3595 g) APGAR: 7, 9  Home with mother. Name: [name] Circumcision Plan: Outpatient  History of Present Illness:  Ms. [name] is a 33 y.o. female, G3P3003, who presents at [redacted]w[redacted]d weeks gestation. The patient has been followed at the [facility] and Gynecology division of [facility]. She was admitted onset of labor. Her pregnancy has been complicated by:  Patient Active Problem List   Diagnosis Date Noted  . SVD (spontaneous vaginal delivery) 04/23/2014  . Active labor 04/22/2014  . Constipation 03/11/2014  . Depression--has been referred to counseling, on Zoloft. 02/20/2014  . Gestational diabetes 11/29/2013  . 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 Nausea and vomiting in pregnancy 09/27/2013  . Ptyalism 09/27/2013     Hospital Course:  Admitted in early labor. Positive GBS. Progressed with pitocin augmentation. Utilized epidural for pain management.  Delivery was performed by [name], CNM, without complication. Patient and baby tolerated the procedure without difficulty, with 1st degree laceration noted. Infant status was stable and remained in room with mother.  Mother and infant then had an uncomplicated postpartum course, with bottle feeding going well. Mom's physical exam was WNL, and she was discharged home in stable condition on day 1.  Baby remained as patient for an additional day. Contraception plan was Nexplanon.  She received adequate benefit from po pain medications.   Feeding:  bottle  Contraception:  Nexplanon  Discharge hemoglobin:  Hemoglobin  Date  Value Ref Range Status  04/23/2014 10.0* 12.0 - 15.0 g/dL Final     HCT  Date Value Ref Range Status  04/23/2014 29.5* 36.0 - 46.0 % Final    Discharge Physical Exam:   General: alert Lochia: appropriate Uterine Fundus: firm Incision: healing well DVT Evaluation: No evidence of DVT seen on physical exam. Negative Homan's sign.  Intrapartum Procedures: spontaneous vaginal delivery Postpartum Procedures: none Complications-Operative and Postpartum: none  Discharge Diagnoses: Term Pregnancy-delivered  Discharge Information:  Activity:           pelvic rest Diet:                routine Medications: Ibuprofen and Percocet Condition:      stable Instructions:     Discharge to: home  Follow-up Information   Follow up with [facility] In 6 weeks. (Call for any questions or concerns.  Call office to schedule baby's circumcision.)    Specialty:  Obstetrics and Gynecology   Contact information:   [address] [place] [address] [phone]       [name] [facility] 04/24/2014 8:31 AM

## 2014-04-24 NOTE — Discharge Instructions (Signed)

## 2014-04-25 ENCOUNTER — Ambulatory Visit: Payer: Self-pay

## 2014-04-25 NOTE — Progress Notes (Signed)
Ur chart review completed.  

## 2014-04-25 NOTE — Lactation Note (Signed)
This note was copied from the chart of [place]. Lactation Consultation Note Mom changed to formula. States baby wouldn't latch. I explained that I could help her try to latch, mom stated a couple of nurses has tried and I'm comfortable w/formula feeding because I'm going back to work. I stated that she could always pump and bottle feed breast milk. Mom stated "no, thanks I'm comfortable with just formula feeding, that's what I did with my other children". Only BF once. Baby skin bili is high, and baby may stay as baby pt.depends on lab results. Reminded mom that if she changes her mind that she could call us for questions.  Patient Name: [name] Date: 04/25/2014     Maternal Data    Feeding    LATCH Score/Interventions                      Lactation Tools Discussed/Used     Consult Status      [name], [name] 04/25/2014, 9:22 AM

## 2014-05-19 IMAGING — US US OB COMP +14 WK
1 series · 13 of 28 positions shown · non-contrast
Comparison: none

[Series 1: nt · 0.23mm/px · 13 of 45 slices shown]
[im 2/45]
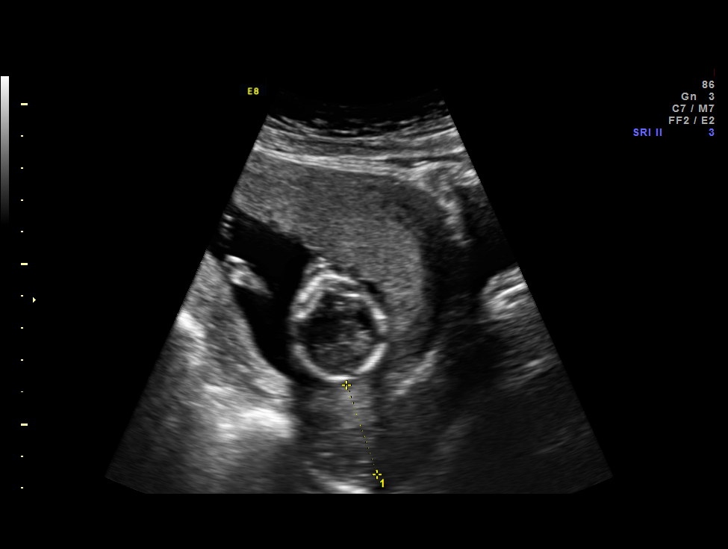
[im 5/45]
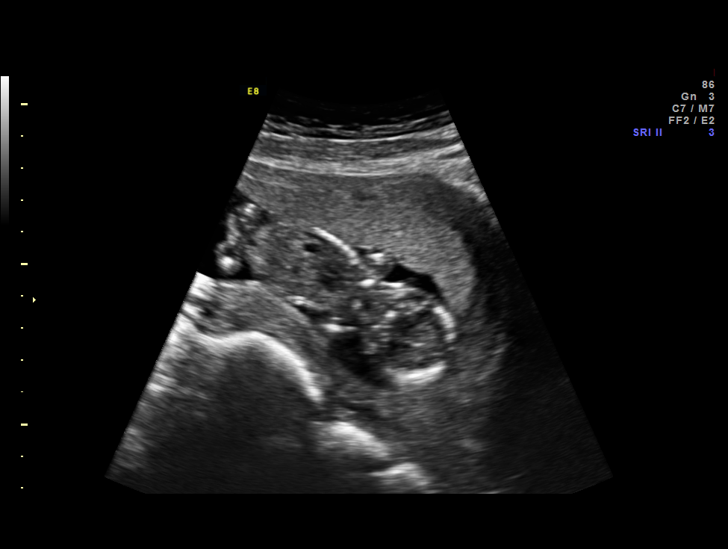
[im 9/45]
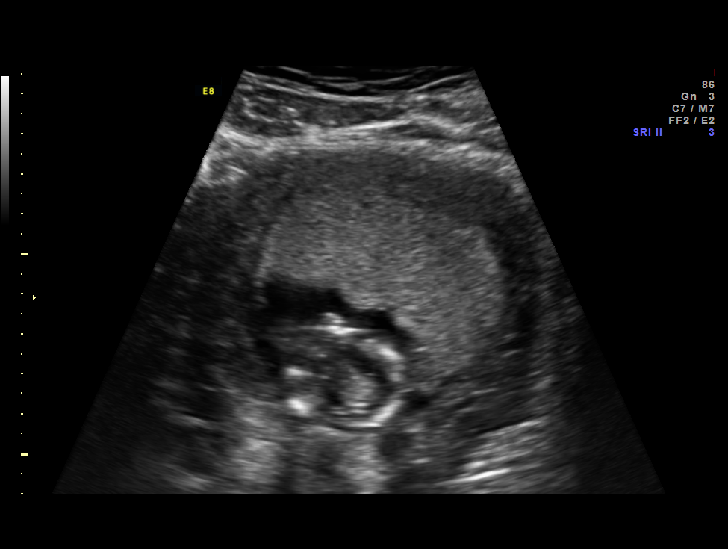
[im 12/45]
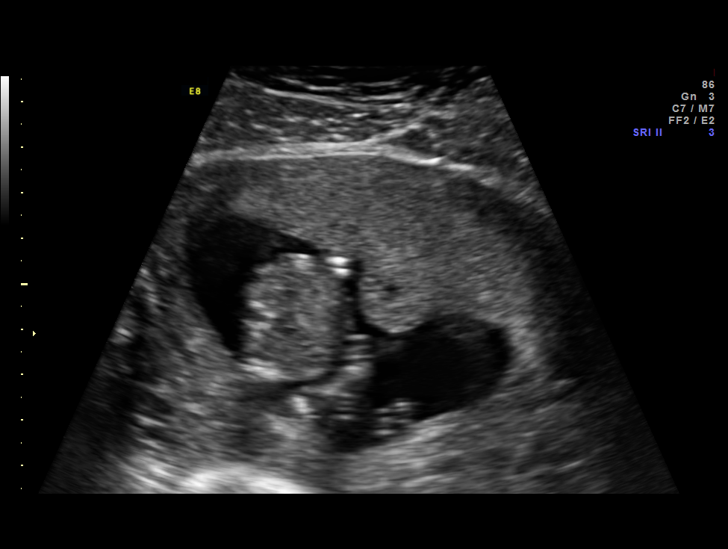
[im 15/45]
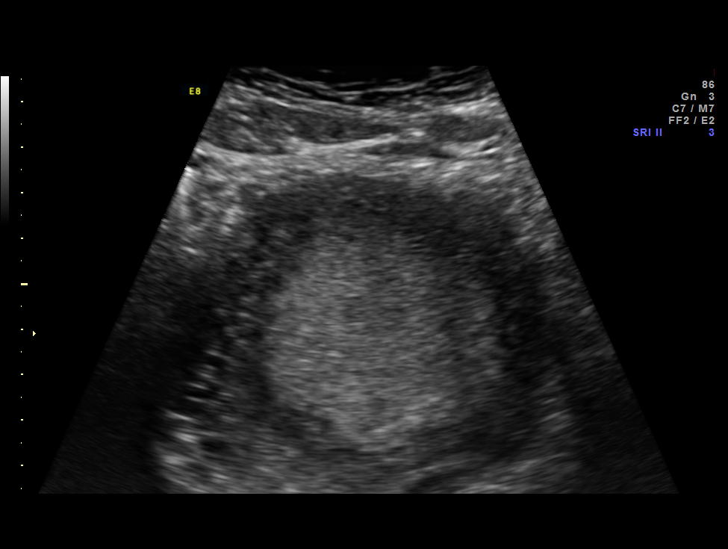
[im 18/45]
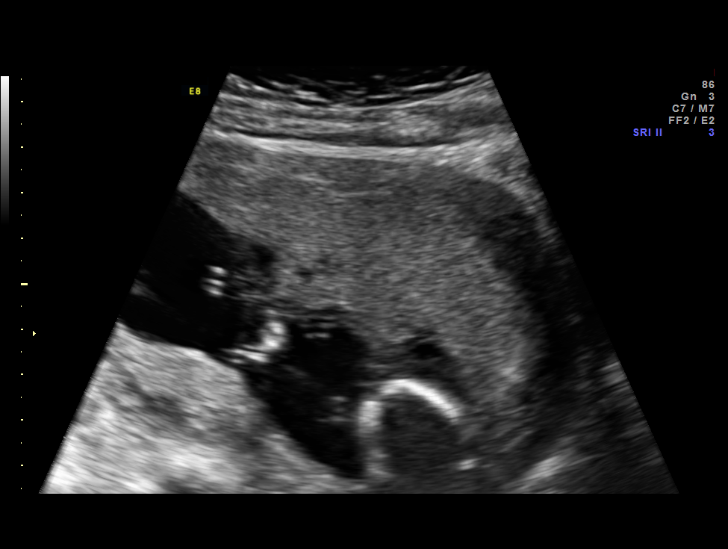
[im 23/45]
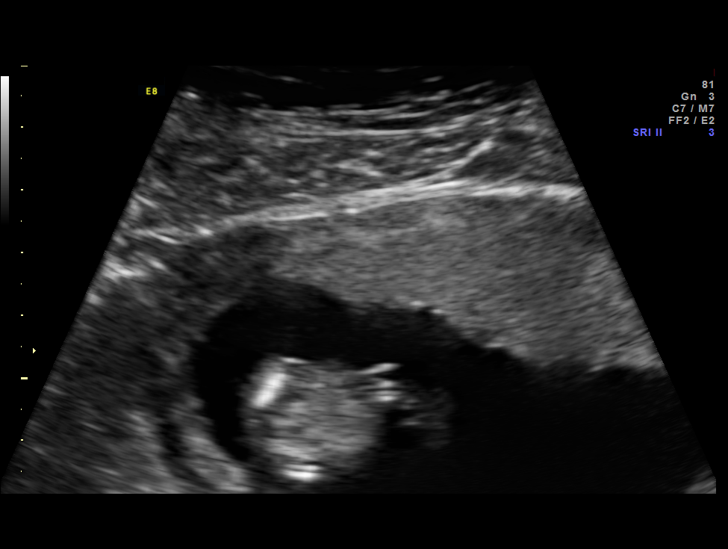
[im 27/45]
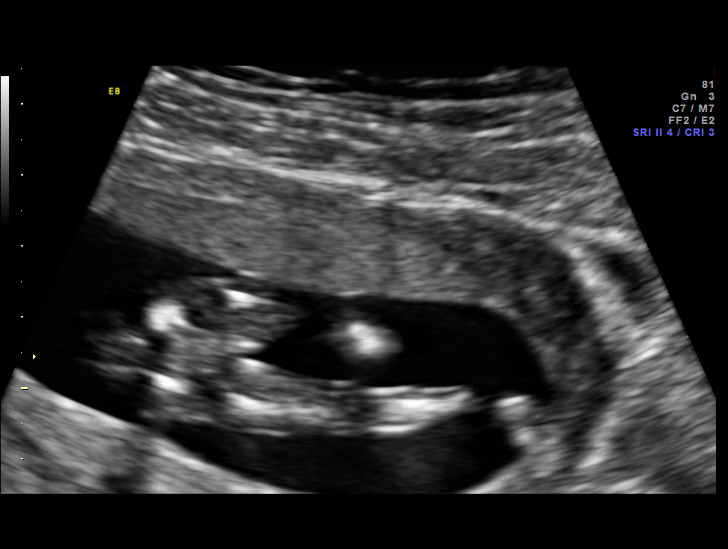
[im 30/45]
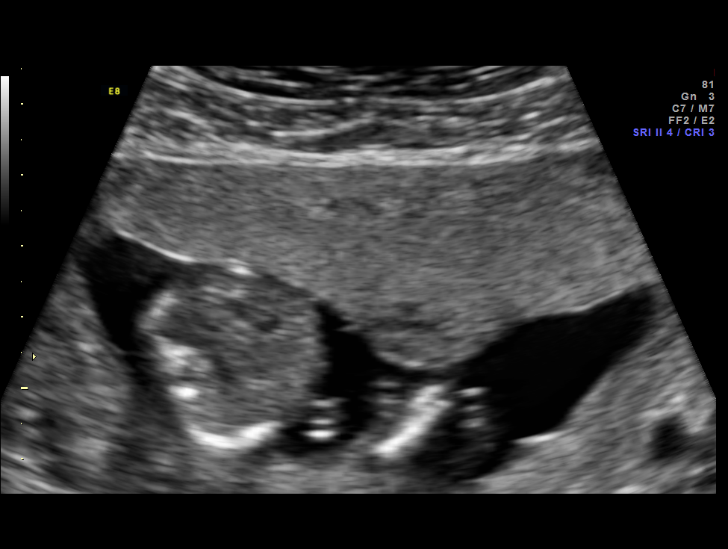
[im 33/45]
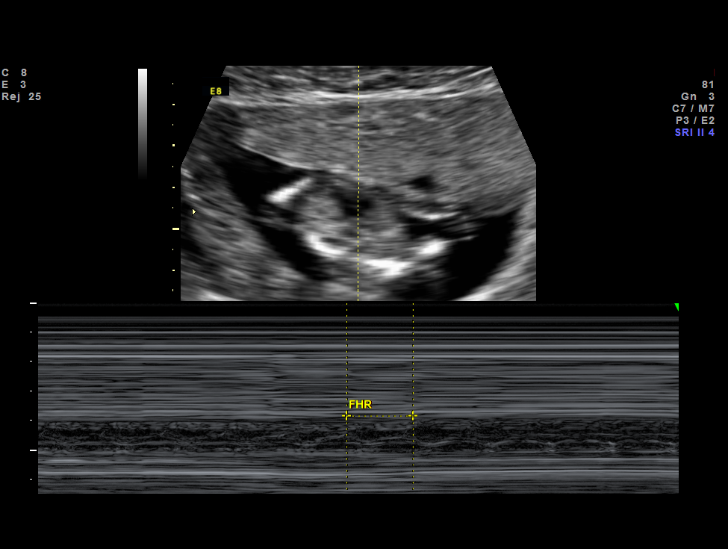
[im 36/45]
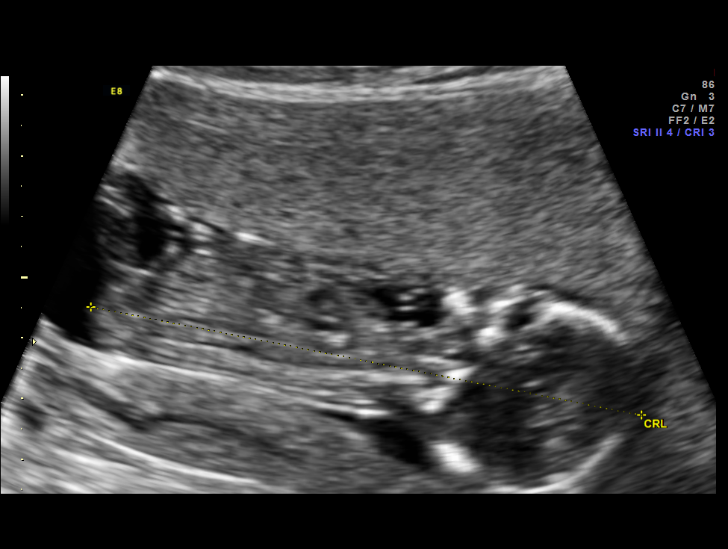
[im 40/45]
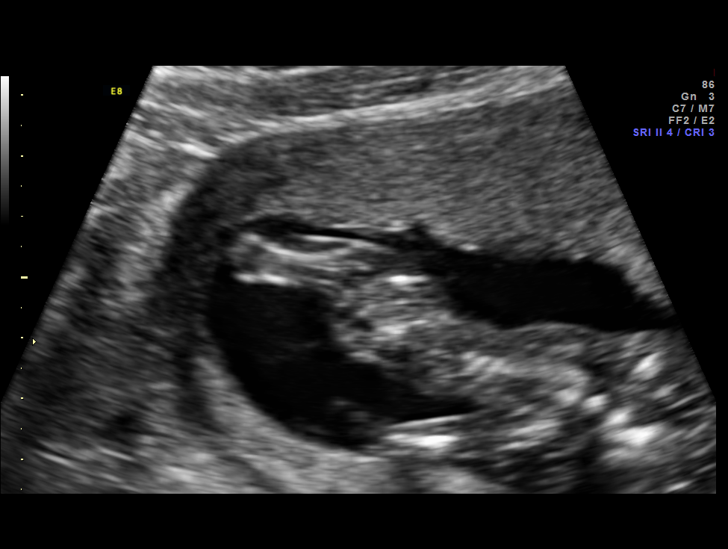
[im 43/45]
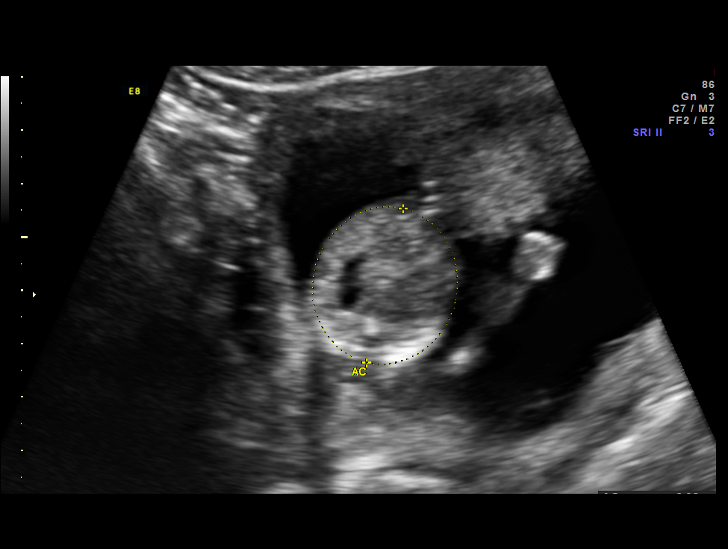

[13 of 28 positions shown; findings below may reference images not displayed]

OBSTETRICS REPORT
                      (Signed Final 11/10/2013 [DATE])

Service(s) Provided

 US OB COMP + 14 WK                                    76805.1
Indications

 Basic anatomic survey
 Unsure of LMP;  Establish Gestational [AGE]
Fetal Evaluation

 Num Of Fetuses:    1
 Fetal Heart Rate:  152                          bpm
 Cardiac Activity:  Observed
 Presentation:      Cephalic
 Placenta:          Anterior, above cervical os
 P. Cord            Visualized, central
 Insertion:

 Amniotic Fluid
 AFI FV:      Subjectively within normal limits
Biometry

 CRL:       91  mm     G. Age:  N/A                    EDD:

 BPD:     26.8  mm     G. Age:  14w 5d                CI:         80.5   70 - 86
 OFD:     33.3  mm                                    FL/HC:
 HC:      96.4  mm     G. Age:  14w 3d                HC/AC:      1.08   1.14 -

 AC:      89.2  mm     G. Age:  15w 1d                FL/BPD:
 FL:      14.9  mm     G. Age:  14w 3d                FL/AC:      16.7   20 - 24

 Est. FW:     105  gm      0 lb 4 oz
Gestational Age

 U/S Today:     14w 5d                                        EDD:   05/06/14
 Best:          14w 3d     Det. By:  Early Exam  (09/16/13)   EDD:   05/08/14
Anatomy

 Choroid Plexus:   Appears normal         Lower             Visualized
                                          Extremities:
 Stomach:          Appears normal, left   Upper             Visualized
                   sided                  Extremities:
 Bladder:          Appears normal
Cervix Uterus Adnexa

 Cervix:       Normal appearance by transabdominal scan.
 Uterus:       No abnormality visualized.
 Cul De Sac:   No free fluid seen.
 Left Ovary:    No adnexal mass visualized.
 Right Ovary:   No adnexal mass visualized.

 Adnexa:     No abnormality visualized.
Impression

 Single IUP at 14 [DATE] weeks by early ultrasound
 Unable to perform first trimester screen (outside of
 gestational window)
 Limited views of the fetal anatomy obtained due to early
 gestational age
 Normal amniotic fluid volume
Recommendations

 Would offer Quad screen at 15-20 weeks gestation
 Recommend ultrasound at 18-20 weeks for fetal anatomy.
 Please contact our office if you would prefer that this
 procedure be performed here.

 questions or concerns.

## 2014-07-04 ENCOUNTER — Encounter (HOSPITAL_COMMUNITY): Payer: Self-pay | Admitting: *Deleted

## 2019-05-31 LAB — OB RESULTS CONSOLE GC/CHLAMYDIA
Chlamydia: NEGATIVE
Gonorrhea: NEGATIVE
Gonorrhea: NEGATIVE

## 2019-05-31 LAB — OB RESULTS CONSOLE ANTIBODY SCREEN: Antibody Screen: NEGATIVE

## 2019-05-31 LAB — OB RESULTS CONSOLE RPR: RPR: NONREACTIVE

## 2019-05-31 LAB — OB RESULTS CONSOLE HEPATITIS B SURFACE ANTIGEN: Hepatitis B Surface Ag: NEGATIVE

## 2019-05-31 LAB — OB RESULTS CONSOLE ABO/RH: RH Type: POSITIVE

## 2019-05-31 LAB — OB RESULTS CONSOLE HIV ANTIBODY (ROUTINE TESTING): HIV: NONREACTIVE

## 2019-05-31 LAB — OB RESULTS CONSOLE RUBELLA ANTIBODY, IGM: Rubella: IMMUNE

## 2019-06-28 ENCOUNTER — Encounter (HOSPITAL_COMMUNITY): Payer: Self-pay | Admitting: Obstetrics and Gynecology

## 2019-07-04 ENCOUNTER — Other Ambulatory Visit (HOSPITAL_COMMUNITY): Payer: Self-pay | Admitting: Obstetrics and Gynecology

## 2019-07-12 ENCOUNTER — Other Ambulatory Visit (HOSPITAL_COMMUNITY): Payer: Self-pay | Admitting: Obstetrics and Gynecology

## 2019-07-12 ENCOUNTER — Ambulatory Visit (HOSPITAL_COMMUNITY): Payer: Self-pay | Attending: Obstetrics and Gynecology | Admitting: Genetic Counselor

## 2019-07-12 ENCOUNTER — Ambulatory Visit (HOSPITAL_COMMUNITY): Payer: Self-pay | Admitting: Genetic Counselor

## 2019-07-12 ENCOUNTER — Other Ambulatory Visit: Payer: Self-pay

## 2019-07-12 ENCOUNTER — Ambulatory Visit (HOSPITAL_COMMUNITY): Payer: Self-pay

## 2019-07-12 DIAGNOSIS — O285 Abnormal chromosomal and genetic finding on antenatal screening of mother: Secondary | ICD-10-CM

## 2019-07-12 DIAGNOSIS — Z31430 Encounter of female for testing for genetic disease carrier status for procreative management: Secondary | ICD-10-CM

## 2019-07-12 DIAGNOSIS — Z36 Encounter for antenatal screening for chromosomal anomalies: Secondary | ICD-10-CM

## 2019-07-12 DIAGNOSIS — Z3A13 13 weeks gestation of pregnancy: Secondary | ICD-10-CM

## 2019-07-12 DIAGNOSIS — Z315 Encounter for genetic counseling: Secondary | ICD-10-CM

## 2019-07-12 NOTE — Progress Notes (Signed)
07/12/2019  [name] 04-08-1981 MRN: [id number] DOV: 07/12/2019  Ms. [name] presented to the [facility] for [facility]aternal [facility] for a genetics consultation regarding abnormal noninvasive prenatal screening (NIPS) results. NIPS results demonstrated an  increased risk for triploidy, trisomy 22, or trisomy 48 in the pregnancy. [name] came to her appointment alone due to COVID-19 visitor restrictions.   Indication for genetic counseling - NIPS high risk for triploidy, trisomy 44, or trisomy 44 due to insufficient fetal DNA fraction  Prenatal history  [name] is a [id number], 38 y.o. female. Her current pregnancy has completed [redacted]w[redacted]d (Estimated Date of Delivery: 01/11/20).  [name] denied exposure to environmental toxins or chemical agents. She denied the use of alcohol, tobacco or street drugs. She reported taking prenatal vitamins. She denied significant viral illnesses, fevers, and bleeding during the course of her pregnancy. Her medical and surgical histories were noncontributory.  Family History  A three generation pedigree was drafted and reviewed. The family history is remarkable for the following:  - [name] reported a personal history of a learning disability. We discussed that many times, learning disabilities are multifactorial in nature, occurring due to a combination of genetic and environmental factors that are difficult to identify. Learning disabilities can appear to run in families; thus, there is a chance that [name]'s children could also experience learning difficulties of some kind. She understands that she should make the pediatrician aware of any concerns she has about her children's development.  - [name] had two paternal aunts that died from breast cancer. One died at the age of 59, and the other at the age of 67. She has several paternal cousins with cancer as well as a history of breast, prostate, and lung cancer on her mother's side of the family. Ms.  Lyons partner also has a maternal family history of cancer. Though most cancers are thought to be sporadic or due to environmental factors, some families appear to have a strong predisposition to cancers. When considering a family history of cancer, we look for common types of cancer in multiple family members occurring at younger than typical ages. We discussed the option of meeting with a cancer genetic counselor to discuss any possible screening or testing options available. If they are concerned about their family histories of cancer and would like to learn more about their families' chance for an inherited cancer syndrome, their doctors may refer them or their relatives to the [facility] [phone]).   The remaining family histories were reviewed and found to be noncontributory for birth defects, intellectual disability, recurrent pregnancy loss, and known genetic conditions.    The patient's ethnicity is African American. The father of the pregnancy's ethnicity is African American. Ashkenazi Jewish ancestry and consanguinity were denied. Pedigree will be scanned under Media.  Discussion  AimeeSmithhad Panorama noninvasive prenatal screening (NIPS) through the laboratory [facility] that was high risk due to insufficient fetal DNA fraction of 2.6%. Given the insufficient fetal fraction, an additional analysis was performed by [facility] that identified the pregnancy as being at high risk (1 in 93) for triploidy, trisomy 59, and trisomy 72.     Panorama NIPS utilizes single nucleotide polymorphisms (SNPs) to distinguish maternal from fetal (placental) DNA. The term "fetal fraction" refers to the amount of sample that is believed to have come from fetal DNA rather than maternal DNA. We reviewed that there are many possible reasons a sample may have a low fetal fraction, including early gestational age,  high maternal BMI, suboptimal sample collection, maternal use of medications like low molecular  weight heparin, pregnancy loss, pregnancy complications, and normal variation. Low fetal fraction is also associated with aneuploidy in 20-30% of cases. Pregnancies affected by triploidy, trisomy 33, and trisomy 40 have all been associated with low fetal fraction on NIPS; however, there is no increased incidence of trisomy 72 or monosomy X in cases with low fetal fraction. It is thought that pregnancies with triploidy, trisomy 9, and trisomy 18 may have smaller placentas, which could result in an insufficient fetal fraction.  During my discussion with [name], I attempted to review the fact that triploidy, trisomy 28, and trisomy 63 are lethal conditions that impact various organ systems throughout the body. I also attempted to review that infants with triploidy, trisomy 86, and trisomy 4 often pass away during pregnancy or very shortly after birth, and that most do not survive beyond 38 year of age. However, [name] requested that I not review these conditions in detail with her. She was tearful and indicated that learning about these conditions was making her too nervous. Instead, I explained that the conditions her result was high-risk for are "serious conditions", and if she chooses to have additional screening or testing that could possibly confirm a risk for trisomy 23, trisomy 57, or triploidy in the pregnancy, we would have a more thorough discussion of the condition of concern at that time.  We discussed additional aneuploidy screening options that are available to [name]. Firstly, we reviewed that redrawing a new sample for NIPS was possible. If the redraw was successful and contained a sufficient fetal fraction, results could clarify the risks for chromosomal aneuploidy in the current pregnancy. Secondly, [name] was counseled that quad screening is another form of aneuploidy screening that is available at 15 weeks' gestation. Instead of utilizing fetal DNA, quad screening measures four  proteins in a pregnant woman's blood. The levels of these proteins can help identify if a pregnancy is at high risk for trisomy 38 (Down syndrome), trisomy 18, and open neural tube defects (ONTDs). Quad screening detects 75-80% of cases of Down syndrome, 73% of cases of trisomy 18, and 80-90% of ONTDs. It cannot assess for the risk of trisomy 82 or triploidy in a pregnancy. Ms. Marina was interested in first pursuing a redraw for NIPS.   Ms. Kosco was also counseled regarding diagnostic testing via amniocentesis any time after 16 weeks' gestation. We discussed the technical aspects of the procedure and quoted up to a 1 in 500 (0.2%) risk for spontaneous pregnancy loss or other adverse pregnancy outcomes as a result of amniocentesis. Cultured cells from an amniocentesis sample allow for the visualization of a fetal karyotype, which can detect >99% of chromosomal aberrations. Chromosomal microarray can also be performed to identify smaller deletions or duplications of fetal chromosomal material. Ms. Balbo was not interested in considering undergoing amniocentesis at this time. She understands that amniocentesis is available at any point after 16 weeks of pregnancy and that she may opt to undergo the procedure at a later date should she change her mind.  Per ACOG recommendation, carrier screening for hemoglobinopathies, cystic fibrosis (CF) and spinal muscular atrophy (SMA) was discussed including information about the conditions, rationale for testing, autosomal recessive inheritance, and the option of prenatal diagnosis. We reviewed that Ms. Yeater previously had a normal MCV of 84.5 and normal hemoglobin electrophoresis ruling out clinically significant hemoglobinopathies such as sickle cell disease. I offered additional carrier screening  for CF and SMA, which Ms. Riki Sheer was interested in pursuing.   Ms. Kittell was feeling tremendously stressed today upon our discussion of her NIPS results. She was under the  impression that Panorama NIPS was only going to assess for gender and was not aware that she could get a result that was high-risk for a fetal aneuploidy. I validated that it can feel very frightening to receive an unexpected result during pregnancy. We discussed my role as a Dietitian, and I informed Ms. Meller that my goal was to support her while also informing her about this result in order to allow her to make the best decisions for herself moving forward in her pregnancy. She appreciated this and expressed that this was just a lot for her to take in unexpectedly. I reminded Ms. Perrins that a 1 in 17 risk for the conditions we discussed equates to approximately a 6% risk; thus, there is a 94% chance that the current fetus does not have one of those conditions. Ms. Daigler was anxious to get more insight into the fetus's chromosomal aneuploidy status as soon as possible. For this reason, she was interested in having a sample redrawn for NIPS today. We discussed that fetal fraction often increases the farther along a woman is in pregnancy. However, we also reviewed that it is possible that there still may be an insufficient fetal fraction in the second sample that could preclude a result. Ms. Richison understood this potential limitation and still desired to try NIPS again.  Given that we do not have a contract with [facility], we were unable to perform a sample redraw for Panorama NIPS in our office. Rather than having to wait for a redraw for Panorama in her referring provider's office, a sample for MaterniT21 NIPS was drawn today. While MaterniT21's NIPS technology assesses the risk for trisomy 15 and trisomy 18 in the pregnancy, it cannot accurately assess for triploidy. Because of this, it is recommended that Ms. Cacace return to Maternal Fetal Medicine for her anatomy ultrasound, as signs of triploidy should be evident on ultrasound by that point in a pregnancy. Ms. Sauer had her blood drawn for MaterniT21  NIPS in addition to carrier screening for CF and SMA today. Results from NIPS will take 5-7 days to be returned. Results from carrier screening will take 2-3 weeks to be returned. I will call Ms. Budman when results become available.  I counseled Ms. Geitz regarding the above risks and available options. The approximate face-to-face time with the genetic counselor was 30 minutes.  In summary:  Discussed NIPS result and options for follow-up testing  High risk (1 in 63) for triploidy, trisomy 89, and trisomy 61 due to low fetal fraction  Opted to have a sample drawn for MaterniT21 NIPS today. MaterniT21 can assess for trisomy 61 and trisomy 104, but cannot assess for triploidy. We will follow results  Recommend referral to Maternal Fetal Medicine for anatomy ultrasound to assess for triploidy  Discussed carrier screening for cystic fibrosis, spinal muscular atrophy, and hemoglobinopathies  Opted to undergo carrier screening. We will follow results  Offered additional testing and screening  Declined amniocentesis (not available until 16 weeks' gestation)  Reviewed family history concerns   Buelah Manis, MS Genetic Counselor

## 2019-07-19 ENCOUNTER — Telehealth (HOSPITAL_COMMUNITY): Payer: Self-pay | Admitting: Genetic Counselor

## 2019-07-19 NOTE — Telephone Encounter (Signed)
I called Ms. Cowen to discuss her negative noninvasive prenatal screening (NIPS)/cell free DNA (cfDNA) testing result. Specifically, Ms. Crymes had MaterniT21 testing through [facility]. Repeat NIPS was offered as Ms. Okerlund's initial Panorama result came back high risk for fetal triploidy, trisomy 89, or trisomy 17 due to insufficient fetal fraction. These negative MaterniT21 results demonstrated an expected representation of chromosome 21, 12, and 37 and sex chromosome material, greatly reducing the likelihood of trisomies 7, 68, or 96, or sex chromosome aneuploidies for the pregnancy. Ms. Pflugh requested to know about the expected fetal sex, which was female.  NIPS analyzes placental (fetal) DNA in maternal circulation. NIPS is considered to be highly specific and sensitive, but is not considered to be a diagnostic test. We reviewed that this testing identifies the majority of pregnancies with trisomies 47, 45, and 55, as well as the sex of the baby. I informed Ms. Stobie that MaterniT21 is unable to assess for fetal triploidy unlike Panorama. However, we discussed that fetuses with triploidy often demonstrate fetal structural anomalies or growth or placental abnormalities on ultrasound. For this reason, it is recommended that Ms. Speiser have her anatomy ultrasound performed here in Maternal Fetal Medicine. Ms. Hickenbottom was also reminded that diagnostic testing via amniocentesis is available should she be interested in confirming this result and ruling out triploidy entirely. She confirmed that she had no questions about these results at this time.  Buelah Manis, MS Genetic Counselor

## 2019-07-20 LAB — MATERNIT21 PLUS CORE+SCA
Fetal Fraction: 6
Monosomy X (Turner Syndrome): NOT DETECTED
Result (T21): NEGATIVE
Trisomy 13 (Patau syndrome): NEGATIVE
Trisomy 18 (Edwards syndrome): NEGATIVE
Trisomy 21 (Down syndrome): NEGATIVE
XXX (Triple X Syndrome): NOT DETECTED
XXY (Klinefelter Syndrome): NOT DETECTED
XYY (Jacobs Syndrome): NOT DETECTED

## 2019-07-20 LAB — INHERITEST(R) CF/SMA PANEL

## 2019-07-26 ENCOUNTER — Other Ambulatory Visit (HOSPITAL_COMMUNITY): Payer: Self-pay | Admitting: Obstetrics and Gynecology

## 2019-07-26 ENCOUNTER — Telehealth (HOSPITAL_COMMUNITY): Payer: Self-pay | Admitting: Genetic Counselor

## 2019-07-26 NOTE — Telephone Encounter (Signed)
I called Ms. Dugay to discuss hernegative carrier screening results for cystic fibrosis (CF) and spinal muscular atrophy (SMA). Ms. Alkire for the mutations analyzed in the CFTR gene associated with CF.Based on this negative resultand her ethnic background, she has a 1 in316residual risk of being a carrier forCF. 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 *3+80T>G SNP.Based on this negative result  and her ethnic background, she has a 1 in375residual risk of being a carrier for SMA. Ms. Banken negative carrier screening results significantly decrease the chance of her pregnancies being affected by one of theseconditions. Ms. Basil was relieved to hear these results and confirmed that she had no further questions at this time.  Buelah Manis, MS Genetic Counselor

## 2019-07-28 ENCOUNTER — Encounter (HOSPITAL_COMMUNITY): Payer: Self-pay | Admitting: Obstetrics and Gynecology

## 2019-07-28 ENCOUNTER — Other Ambulatory Visit (HOSPITAL_COMMUNITY): Payer: Self-pay | Admitting: Obstetrics and Gynecology

## 2019-08-03 ENCOUNTER — Other Ambulatory Visit (HOSPITAL_COMMUNITY): Payer: Self-pay | Admitting: Obstetrics and Gynecology

## 2019-08-03 DIAGNOSIS — O285 Abnormal chromosomal and genetic finding on antenatal screening of mother: Secondary | ICD-10-CM

## 2019-08-03 DIAGNOSIS — Z3689 Encounter for other specified antenatal screening: Secondary | ICD-10-CM

## 2019-08-03 DIAGNOSIS — Z3A19 19 weeks gestation of pregnancy: Secondary | ICD-10-CM

## 2019-08-17 ENCOUNTER — Encounter (HOSPITAL_COMMUNITY): Payer: Self-pay | Admitting: *Deleted

## 2019-08-20 ENCOUNTER — Other Ambulatory Visit (HOSPITAL_COMMUNITY): Payer: Self-pay | Admitting: *Deleted

## 2019-08-20 ENCOUNTER — Ambulatory Visit (HOSPITAL_COMMUNITY): Payer: Managed Care, Other (non HMO) | Admitting: *Deleted

## 2019-08-20 ENCOUNTER — Other Ambulatory Visit: Payer: Self-pay

## 2019-08-20 ENCOUNTER — Ambulatory Visit (HOSPITAL_BASED_OUTPATIENT_CLINIC_OR_DEPARTMENT_OTHER): Payer: Managed Care, Other (non HMO) | Admitting: Obstetrics

## 2019-08-20 ENCOUNTER — Ambulatory Visit (HOSPITAL_COMMUNITY)
Admission: RE | Admit: 2019-08-20 | Discharge: 2019-08-20 | Disposition: A | Discharge status: Home / Self Care | Payer: Managed Care, Other (non HMO) | Source: Ambulatory Visit | Attending: Obstetrics and Gynecology | Admitting: Obstetrics and Gynecology

## 2019-08-20 ENCOUNTER — Encounter (HOSPITAL_COMMUNITY): Payer: Self-pay

## 2019-08-20 VITALS — BP 109/83 | HR 102 | Temp 97.8°F

## 2019-08-20 DIAGNOSIS — O24112 Pre-existing diabetes mellitus, type 2, in pregnancy, second trimester: Secondary | ICD-10-CM | POA: Diagnosis not present

## 2019-08-20 DIAGNOSIS — Z3A19 19 weeks gestation of pregnancy: Secondary | ICD-10-CM | POA: Diagnosis present

## 2019-08-20 DIAGNOSIS — O09522 Supervision of elderly multigravida, second trimester: Secondary | ICD-10-CM

## 2019-08-20 DIAGNOSIS — O10912 Unspecified pre-existing hypertension complicating pregnancy, second trimester: Secondary | ICD-10-CM | POA: Insufficient documentation

## 2019-08-20 DIAGNOSIS — O99212 Obesity complicating pregnancy, second trimester: Secondary | ICD-10-CM

## 2019-08-20 DIAGNOSIS — O0992 Supervision of high risk pregnancy, unspecified, second trimester: Secondary | ICD-10-CM | POA: Diagnosis not present

## 2019-08-20 DIAGNOSIS — O0990 Supervision of high risk pregnancy, unspecified, unspecified trimester: Secondary | ICD-10-CM

## 2019-08-20 DIAGNOSIS — Z3689 Encounter for other specified antenatal screening: Secondary | ICD-10-CM | POA: Diagnosis not present

## 2019-08-20 DIAGNOSIS — E669 Obesity, unspecified: Secondary | ICD-10-CM | POA: Insufficient documentation

## 2019-08-20 DIAGNOSIS — O10012 Pre-existing essential hypertension complicating pregnancy, second trimester: Secondary | ICD-10-CM

## 2019-08-20 DIAGNOSIS — O10919 Unspecified pre-existing hypertension complicating pregnancy, unspecified trimester: Secondary | ICD-10-CM

## 2019-08-20 DIAGNOSIS — O289 Unspecified abnormal findings on antenatal screening of mother: Secondary | ICD-10-CM

## 2019-08-20 DIAGNOSIS — O285 Abnormal chromosomal and genetic finding on antenatal screening of mother: Secondary | ICD-10-CM | POA: Insufficient documentation

## 2019-08-20 NOTE — Progress Notes (Signed)
MFM Consult  This patient was seen for a detailed fetal anatomy scan due to advanced maternal age, maternal obesity, history of chronic hypertension treated with labetalol, and type 2 diabetes treated with Metformin.  The patient reports that she was diagnosed with type 2 diabetes about 8 years ago.  She reports that she is taking Metformin sporadically as it upsets her stomach.  She also reports that she is only performing fingersticks sporadically.  Her hemoglobin A1c earlier in her current pregnancy was 6.4%.  She reports that her postprandial fingersticks last night were in the 180s.  The patient also reports that she is only taking labetalol for treatment of her elevated blood pressures sporadically. She denies any other significant past medical history.   Her initial cell free DNA test indicated a high risk of trisomy 18 or 13 due to a low fetal fraction.  She subsequently had another cell free DNA test run by different lab which indicated a low risk for trisomy 21, 18, and 13.  An MSAFP was 1.71 MoM.  She was informed that the fetal growth and amniotic fluid level were appropriate for her gestational age.   There were no obvious fetal anomalies noted on today's ultrasound exam.  However, today's exam was limited  due to extreme maternal body habitus and the fetal position.  The patient was informed that anomalies may be missed due to technical limitations. If the fetus is in a suboptimal position or maternal habitus is increased, visualization of the fetus in the maternal uterus may be impaired.  The implications and management of chronic hypertension in pregnancy was discussed.  The patient was encouraged to take labetalol as prescribed.  She was advised that in most women, their blood pressures will start to increase once they enter the third trimester of pregnancy.  The patient was advised that should her blood pressures increase, the dosage of her antihypertensive medications may need to be  increased.  The increased risk of superimposed preeclampsia, an indicated preterm delivery, and possible fetal growth restriction due to chronic hypertension in pregnancy was discussed.   To decrease her risk of superimposed preeclampsia, she should start taking a daily baby aspirin (81 mg daily) for preeclampsia prophylaxis.   The implications and management of diabetes in pregnancy was discussed in detail with the patient. She was advised that our goals for her fingerstick values are fasting values of 90-95 or less and two-hour postprandials of 120 or less.  Should her fingerstick values be above these values, she may have to be started on insulin to help her achieve better glycemic control.  As the patient reports that she has been unable to take Metformin because it upsets her stomach, consideration should be given to switching her to insulin for glycemic control.  The patient was advised that getting her fingerstick values as close to these goals as possible would provide her with the most optimal obstetrical outcome.  The increased risk of fetal anomalies, fetal macrosomia, polyhydramnios, an indicated preterm delivery, and an IUFD associated with uncontrolled diabetes in pregnancy was discussed.  The patient was reassured that based on today's ultrasound, there were no obvious anomalies that were noted in her fetus.  The patient reports that she already has a fetal echocardiogram scheduled in a few weeks with pediatric cardiology.    Due to diabetes in pregnancy and chronic hypertension, she should continue to be followed with serial growth ultrasounds.  Twice-weekly fetal testing should be started at around 32 weeks.  The  patient was advised that should the fetal growth appear normal and she maintains normal glucose control, that delivery at around 39 weeks is usually recommended.  Should her blood pressures be increased or should her blood glucose levels remain uncontrolled, delivery should occur at  around 37 weeks.  Due to advanced maternal age and as her first cell free DNA test indicated a high risk for trisomy 62 or 80, the patient was offered and declined an amniocentesis today for definitive diagnosis of fetal aneuploidy.  She reports that she is comfortable with the normal results from her second cell free DNA test.  A follow-up exam was scheduled in 4 weeks to obtain better views of the fetal anatomy.  A total of 45 minutes was spent counseling and coordinating the care for this patient.  Greater than 50% of the time was spent in direct face-to-face contact.

## 2019-09-03 NOTE — L&D Delivery Note (Signed)
Delivery Note:   [id number] at [redacted]w[redacted]d  Admitting diagnosis: Encounter for planned induction of labor [Z34.90] Risks:  1 - CHTN - started labetalol 200 mg BID at 17 wks, poor compliance w/ regimen d/t persistent N/V in pregnancy             - followed with MFM consult, ANFT and serial growths, SGA at 19 wks, AGA at 32 wks, normal BPP's             - PEC labs wnl 29 wks, no evidence superimposed PEC 2 - T2DM - poor control on metformin 500 mg BID, not taking regularly d/t N/V, last A1c 6.4 at start of PNC             - normal fetal echo 3 - N/V refractory to Zofran, phenergan, doxylamine/VB6 4 - BMI 42 at onset of PNC, 20 lbs TWG 5 - AMA 39 yo 6 - Hx HSV1,2, 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 Pushing in L lateral position with CNM and L&D staff support, FOB present for birth and supportive.  Delivery of a Live born female  Birth Weight:  7 lb 6.7 oz (3365 g) APGAR: 9, 9  Newborn Delivery   Birth date/time: 12/21/2019 16:38:00 Delivery type: Vaginal, Spontaneous      in cephalic presentation, position OA to ROT. Nuchal Cord: No  Cord double clamped after cessation of pulsation, cut by FOB.  Collection of cord blood for typing completed. Cord blood donation-None  Arterial cord blood sample-No    Placenta delivered-Spontaneous  with 3 vessels . Cord evulsion with delivery, gentle traction only but very fragile insert,  blacenta removed fro vagina with small traction and maternal effort.  Uterotonics: Pitocin bolus after fetal delivery Placenta to L&D for disposal. Uterine tone firm, bleeding small  None  laceration identified.  Episiotomy:None  Local analgesia: NA  Est. Blood Loss ([id number]   Complications: None   Mom to postpartum.  Baby to Couplet care / Skin to Skin.  Delivery Report:  Review the Delivery Report for details.     Signed: Juliene Pina, CNM, MSN 12/21/2019, 5:25 PM

## 2019-09-20 ENCOUNTER — Other Ambulatory Visit: Payer: Self-pay

## 2019-09-20 ENCOUNTER — Ambulatory Visit (HOSPITAL_COMMUNITY): Payer: Managed Care, Other (non HMO) | Admitting: *Deleted

## 2019-09-20 ENCOUNTER — Encounter (HOSPITAL_COMMUNITY): Payer: Self-pay | Admitting: *Deleted

## 2019-09-20 ENCOUNTER — Ambulatory Visit (HOSPITAL_COMMUNITY)
Admission: RE | Admit: 2019-09-20 | Discharge: 2019-09-20 | Disposition: A | Discharge status: Home / Self Care | Payer: Managed Care, Other (non HMO) | Source: Ambulatory Visit | Attending: Obstetrics | Admitting: Obstetrics

## 2019-09-20 VITALS — BP 117/75 | HR 98 | Temp 97.7°F

## 2019-09-20 DIAGNOSIS — Z362 Encounter for other antenatal screening follow-up: Secondary | ICD-10-CM | POA: Diagnosis not present

## 2019-09-20 DIAGNOSIS — O10012 Pre-existing essential hypertension complicating pregnancy, second trimester: Secondary | ICD-10-CM | POA: Diagnosis not present

## 2019-09-20 DIAGNOSIS — O289 Unspecified abnormal findings on antenatal screening of mother: Secondary | ICD-10-CM

## 2019-09-20 DIAGNOSIS — Z3A23 23 weeks gestation of pregnancy: Secondary | ICD-10-CM

## 2019-09-20 DIAGNOSIS — O10919 Unspecified pre-existing hypertension complicating pregnancy, unspecified trimester: Secondary | ICD-10-CM | POA: Diagnosis not present

## 2019-09-20 DIAGNOSIS — O24112 Pre-existing diabetes mellitus, type 2, in pregnancy, second trimester: Secondary | ICD-10-CM

## 2019-09-20 DIAGNOSIS — O99212 Obesity complicating pregnancy, second trimester: Secondary | ICD-10-CM

## 2019-10-11 ENCOUNTER — Other Ambulatory Visit: Payer: Self-pay

## 2019-10-11 ENCOUNTER — Inpatient Hospital Stay (HOSPITAL_COMMUNITY)
Admission: AD | Admit: 2019-10-11 | Discharge: 2019-10-11 | Disposition: A | Discharge status: Home / Self Care | Payer: Managed Care, Other (non HMO) | Attending: Obstetrics & Gynecology | Admitting: Obstetrics & Gynecology

## 2019-10-11 ENCOUNTER — Encounter (HOSPITAL_COMMUNITY): Payer: Self-pay | Admitting: Obstetrics & Gynecology

## 2019-10-11 DIAGNOSIS — K5909 Other constipation: Secondary | ICD-10-CM | POA: Insufficient documentation

## 2019-10-11 DIAGNOSIS — O24112 Pre-existing diabetes mellitus, type 2, in pregnancy, second trimester: Secondary | ICD-10-CM | POA: Insufficient documentation

## 2019-10-11 DIAGNOSIS — O10912 Unspecified pre-existing hypertension complicating pregnancy, second trimester: Secondary | ICD-10-CM | POA: Diagnosis not present

## 2019-10-11 DIAGNOSIS — O09522 Supervision of elderly multigravida, second trimester: Secondary | ICD-10-CM | POA: Insufficient documentation

## 2019-10-11 DIAGNOSIS — F329 Major depressive disorder, single episode, unspecified: Secondary | ICD-10-CM | POA: Insufficient documentation

## 2019-10-11 DIAGNOSIS — Z3A26 26 weeks gestation of pregnancy: Secondary | ICD-10-CM | POA: Diagnosis not present

## 2019-10-11 DIAGNOSIS — J45909 Unspecified asthma, uncomplicated: Secondary | ICD-10-CM | POA: Diagnosis not present

## 2019-10-11 DIAGNOSIS — Z833 Family history of diabetes mellitus: Secondary | ICD-10-CM | POA: Insufficient documentation

## 2019-10-11 DIAGNOSIS — O99512 Diseases of the respiratory system complicating pregnancy, second trimester: Secondary | ICD-10-CM | POA: Insufficient documentation

## 2019-10-11 DIAGNOSIS — O99612 Diseases of the digestive system complicating pregnancy, second trimester: Secondary | ICD-10-CM | POA: Insufficient documentation

## 2019-10-11 DIAGNOSIS — Z79899 Other long term (current) drug therapy: Secondary | ICD-10-CM | POA: Diagnosis not present

## 2019-10-11 DIAGNOSIS — O99342 Other mental disorders complicating pregnancy, second trimester: Secondary | ICD-10-CM | POA: Diagnosis not present

## 2019-10-11 DIAGNOSIS — O26892 Other specified pregnancy related conditions, second trimester: Secondary | ICD-10-CM | POA: Diagnosis not present

## 2019-10-11 DIAGNOSIS — E119 Type 2 diabetes mellitus without complications: Secondary | ICD-10-CM | POA: Diagnosis not present

## 2019-10-11 DIAGNOSIS — Z8249 Family history of ischemic heart disease and other diseases of the circulatory system: Secondary | ICD-10-CM | POA: Diagnosis not present

## 2019-10-11 LAB — URINALYSIS, ROUTINE W REFLEX MICROSCOPIC
Bilirubin Urine: NEGATIVE
Glucose, UA: NEGATIVE mg/dL
Hgb urine dipstick: NEGATIVE
Ketones, ur: 20 mg/dL — AB
Leukocytes,Ua: NEGATIVE
Nitrite: NEGATIVE
Protein, ur: NEGATIVE mg/dL
Specific Gravity, Urine: 1.018 (ref 1.005–1.030)
pH: 6 (ref 5.0–8.0)

## 2019-10-11 MED ORDER — FLEET ENEMA 7-19 GM/118ML RE ENEM
1.0000 | ENEMA | Freq: Once | RECTAL | Status: AC
  Administered 2019-10-11: 1 via RECTAL

## 2019-10-11 NOTE — MAU Note (Signed)
Is constipated.  Has been trying to eat right, but it's not helping. Has been throwing up, can't keep her pills down.  Tooth is broke, has to be removed, is hurting her

## 2019-10-11 NOTE — Discharge Instructions (Signed)
-  take colace three times a day, and try enema at home. Keep up fiber, fruits, vegetables water and miralax.   Constipation, Adult Constipation is when a person has fewer bowel movements in a week than normal, has difficulty having a bowel movement, or has stools that are dry, hard, or larger than normal. Constipation may be caused by an underlying condition. It may become worse with age if a person takes certain medicines and does not take in enough fluids. Follow these instructions at home: Eating and drinking   Eat foods that have a lot of fiber, such as fresh fruits and vegetables, whole grains, and beans.  Limit foods that are high in fat, low in fiber, or overly processed, such as french fries, hamburgers, cookies, candies, and soda.  Drink enough fluid to keep your urine clear or pale yellow. General instructions  Exercise regularly or as told by your health care provider.  Go to the restroom when you have the urge to go. Do not hold it in.  Take over-the-counter and prescription medicines only as told by your health care provider. These include any fiber supplements.  Practice pelvic floor retraining exercises, such as deep breathing while relaxing the lower abdomen and pelvic floor relaxation during bowel movements.  Watch your condition for any changes.  Keep all follow-up visits as told by your health care provider. This is important. Contact a health care provider if:  You have pain that gets worse.  You have a fever.  You do not have a bowel movement after 4 days.  You vomit.  You are not hungry.  You lose weight.  You are bleeding from the anus.  You have thin, pencil-like stools. Get help right away if:  You have a fever and your symptoms suddenly get worse.  You leak stool or have blood in your stool.  Your abdomen is bloated.  You have severe pain in your abdomen.  You feel dizzy or you faint. This information is not intended to replace advice  given to you by your health care provider. Make sure you discuss any questions you have with your health care provider. Document Revised: 08/01/2017 Document Reviewed: 02/07/2016 Elsevier Patient Education  2020 [employer].

## 2019-10-11 NOTE — MAU Provider Note (Signed)
History     CSN: [id number]  Arrival date and time: 10/11/19 1840   None     Chief Complaint  Patient presents with  . Constipation  . Emesis   HPI Patient [name] is a 39 y.o. G4P3003  At [redacted]w[redacted]d here with complaints of constipation. She denies vaginal bleeding, decreased fetal movements, LOF. She has not been taking her stool softeners. She feels that she has had only 2 bm in the past two days; they were hard and small. Before that, she cannot remember her last BM. She also says that she is feeling nauseated and vomiting due to the pain in her colon.    OB History    Gravida  4   Para  3   Term  3   Preterm  0   AB  0   Living  3     SAB  0   TAB  0   Ectopic  0   Multiple  0   Live Births  3           Past Medical History:  Diagnosis Date  . Abnormal Pap smear   . Asthma   . Depression   . GDM (gestational diabetes mellitus)   . H/O varicella   . Hx of bacterial infection   . Hyperemesis    During first pregnancy  . Hypertension   . Sinusitis   . Trichomonas 02/2011   treated  . Type 2 diabetes mellitus complicating pregnancy, antepartum   . Urinary tract infection   . Vaginal Pap smear, abnormal     Past Surgical History:  Procedure Laterality Date  . NO PAST SURGERIES      Family History  Problem Relation Age of Onset  . Diabetes Mother   . Hypertension Mother   . Peripheral vascular disease Mother   . Thyroid disease Mother   . Asthma Mother   . Diabetes Father   . Hypertension Father   . Seizures Maternal Aunt   . Alcohol abuse Maternal Aunt   . Heart attack Maternal Uncle   . Kidney disease Maternal Uncle   . Cancer Maternal Uncle        prostate  . Thyroid disease Paternal Aunt   . Heart attack Paternal Uncle   . Kidney disease Paternal Uncle   . Anesthesia problems Neg Hx   . Hypotension Neg Hx   . Malignant hyperthermia Neg Hx   . Pseudochol deficiency Neg Hx     Social History   Tobacco Use  . Smoking  status: Never Smoker  . Smokeless tobacco: Never Used  Substance Use Topics  . Alcohol use: No  . Drug use: No    Allergies:  Allergies  Allergen Reactions  . Flagyl [Metronidazole Hcl] Swelling  . Latex Hives  . Other Other (See Comments)    Pt has a reaction to plastic tape. She breaks out with a rash and her skin peels.    Medications Prior to Admission  Medication Sig Dispense Refill Last Dose  . Acetaminophen (TYLENOL PO) Take by mouth.     . docusate sodium (COLACE) 100 MG capsule Take 200 mg by mouth daily as needed for mild constipation.      Marland Kitchen doxylamine, Sleep, (UNISOM) 25 MG tablet Take 25 mg by mouth at bedtime.     . FLUOXETINE HCL PO Take by mouth.     Marland Kitchen ibuprofen (ADVIL,MOTRIN) 600 MG tablet Take 1 tablet (600 mg total) by mouth every  6 (six) hours as needed. (Patient not taking: Reported on 08/20/2019) 36 tablet 2   . oxyCODONE-acetaminophen (PERCOCET/ROXICET) 5-325 MG per tablet Take 1-2 tablets by mouth every 4 (four) hours as needed for severe pain (moderate - severe pain). (Patient not taking: Reported on 09/20/2019) 30 tablet 0   . pantoprazole (PROTONIX) 40 MG tablet Take 40 mg by mouth daily.     . Prenatal Vit-Fe Fumarate-FA (PRENATAL MULTIVITAMIN) TABS tablet Take 1 tablet by mouth daily.      . sertraline (ZOLOFT) 50 MG tablet Take 100 mg by mouth daily.       Review of Systems  Constitutional: Negative.   HENT: Negative.   Respiratory: Negative.   Cardiovascular: Negative.   Gastrointestinal: Negative.   Genitourinary: Negative.   Musculoskeletal: Negative.   Neurological: Negative.    Physical Exam   Blood pressure 120/64, pulse 91, temperature 98.6 F (37 C), temperature source Oral, resp. rate 20, weight 118.7 kg, last menstrual period 04/06/2019, SpO2 98 %, unknown if currently breastfeeding.  Physical Exam  Constitutional: She is oriented to person, place, and time. She appears well-developed.  HENT:  Head: Normocephalic.  Respiratory:  Effort normal.  GI: Soft.  Genitourinary:    Genitourinary Comments: NEFG; cervix is long and closed, large amount of stool in colon.    Musculoskeletal:        General: Normal range of motion.     Cervical back: Normal range of motion.  Neurological: She is alert and oriented to person, place, and time.  Skin: Skin is warm and dry.    MAU Course  Procedures  MDM -NST: 140 bpm, mod var, present acel, neg decels, uterine irratability.  -Enema given in MAU, patient feels relief and desires discharge.   Assessment and Plan   1. Other constipation    2. Patient stable for discharge; will send RX for Fleet enema and docusate sodium. Improve diet with fruits vegetables and fiber.   3. Keep follow up appointment; chart sent to [facility].   Mervyn Skeeters [place] 10/11/2019, 8:08 PM

## 2019-10-19 ENCOUNTER — Other Ambulatory Visit (HOSPITAL_COMMUNITY): Payer: Self-pay | Admitting: Obstetrics and Gynecology

## 2019-10-19 DIAGNOSIS — O99212 Obesity complicating pregnancy, second trimester: Secondary | ICD-10-CM

## 2019-10-19 DIAGNOSIS — O09512 Supervision of elderly primigravida, second trimester: Secondary | ICD-10-CM

## 2019-10-19 DIAGNOSIS — Z3A28 28 weeks gestation of pregnancy: Secondary | ICD-10-CM

## 2019-10-19 DIAGNOSIS — Z3689 Encounter for other specified antenatal screening: Secondary | ICD-10-CM

## 2019-10-22 ENCOUNTER — Other Ambulatory Visit: Payer: Self-pay

## 2019-10-22 ENCOUNTER — Ambulatory Visit (HOSPITAL_COMMUNITY): Payer: Managed Care, Other (non HMO) | Admitting: *Deleted

## 2019-10-22 ENCOUNTER — Ambulatory Visit (HOSPITAL_COMMUNITY)
Admission: RE | Admit: 2019-10-22 | Discharge: 2019-10-22 | Disposition: A | Discharge status: Home / Self Care | Payer: Managed Care, Other (non HMO) | Source: Ambulatory Visit | Attending: Obstetrics and Gynecology | Admitting: Obstetrics and Gynecology

## 2019-10-22 ENCOUNTER — Other Ambulatory Visit (HOSPITAL_COMMUNITY): Payer: Self-pay | Admitting: *Deleted

## 2019-10-22 ENCOUNTER — Encounter (HOSPITAL_COMMUNITY): Payer: Self-pay

## 2019-10-22 VITALS — BP 124/84 | HR 100 | Temp 97.3°F

## 2019-10-22 DIAGNOSIS — E669 Obesity, unspecified: Secondary | ICD-10-CM | POA: Diagnosis not present

## 2019-10-22 DIAGNOSIS — O09512 Supervision of elderly primigravida, second trimester: Secondary | ICD-10-CM

## 2019-10-22 DIAGNOSIS — O99213 Obesity complicating pregnancy, third trimester: Secondary | ICD-10-CM | POA: Insufficient documentation

## 2019-10-22 DIAGNOSIS — O09513 Supervision of elderly primigravida, third trimester: Secondary | ICD-10-CM | POA: Diagnosis not present

## 2019-10-22 DIAGNOSIS — Z3689 Encounter for other specified antenatal screening: Secondary | ICD-10-CM

## 2019-10-22 DIAGNOSIS — O24113 Pre-existing diabetes mellitus, type 2, in pregnancy, third trimester: Secondary | ICD-10-CM | POA: Diagnosis not present

## 2019-10-22 DIAGNOSIS — E119 Type 2 diabetes mellitus without complications: Secondary | ICD-10-CM | POA: Diagnosis not present

## 2019-10-22 DIAGNOSIS — O10919 Unspecified pre-existing hypertension complicating pregnancy, unspecified trimester: Secondary | ICD-10-CM

## 2019-10-22 DIAGNOSIS — Z7984 Long term (current) use of oral hypoglycemic drugs: Secondary | ICD-10-CM | POA: Insufficient documentation

## 2019-10-22 DIAGNOSIS — O99212 Obesity complicating pregnancy, second trimester: Secondary | ICD-10-CM | POA: Diagnosis not present

## 2019-10-22 DIAGNOSIS — Z3A28 28 weeks gestation of pregnancy: Secondary | ICD-10-CM

## 2019-10-22 DIAGNOSIS — O24119 Pre-existing diabetes mellitus, type 2, in pregnancy, unspecified trimester: Secondary | ICD-10-CM

## 2019-10-28 ENCOUNTER — Encounter (HOSPITAL_COMMUNITY): Payer: Self-pay | Admitting: Obstetrics and Gynecology

## 2019-10-28 ENCOUNTER — Inpatient Hospital Stay (HOSPITAL_COMMUNITY)
Admission: AD | Admit: 2019-10-28 | Discharge: 2019-10-28 | Disposition: A | Discharge status: Home / Self Care | Payer: 59 | Attending: Obstetrics and Gynecology | Admitting: Obstetrics and Gynecology

## 2019-10-28 DIAGNOSIS — Z833 Family history of diabetes mellitus: Secondary | ICD-10-CM | POA: Insufficient documentation

## 2019-10-28 DIAGNOSIS — O10913 Unspecified pre-existing hypertension complicating pregnancy, third trimester: Secondary | ICD-10-CM | POA: Diagnosis not present

## 2019-10-28 DIAGNOSIS — Z8349 Family history of other endocrine, nutritional and metabolic diseases: Secondary | ICD-10-CM | POA: Diagnosis not present

## 2019-10-28 DIAGNOSIS — Z79899 Other long term (current) drug therapy: Secondary | ICD-10-CM | POA: Insufficient documentation

## 2019-10-28 DIAGNOSIS — Z881 Allergy status to other antibiotic agents status: Secondary | ICD-10-CM | POA: Insufficient documentation

## 2019-10-28 DIAGNOSIS — Z7984 Long term (current) use of oral hypoglycemic drugs: Secondary | ICD-10-CM | POA: Insufficient documentation

## 2019-10-28 DIAGNOSIS — Z87440 Personal history of urinary (tract) infections: Secondary | ICD-10-CM | POA: Diagnosis not present

## 2019-10-28 DIAGNOSIS — O24113 Pre-existing diabetes mellitus, type 2, in pregnancy, third trimester: Secondary | ICD-10-CM | POA: Diagnosis not present

## 2019-10-28 DIAGNOSIS — E119 Type 2 diabetes mellitus without complications: Secondary | ICD-10-CM | POA: Diagnosis not present

## 2019-10-28 DIAGNOSIS — O212 Late vomiting of pregnancy: Secondary | ICD-10-CM | POA: Insufficient documentation

## 2019-10-28 DIAGNOSIS — R112 Nausea with vomiting, unspecified: Secondary | ICD-10-CM | POA: Diagnosis present

## 2019-10-28 DIAGNOSIS — Z825 Family history of asthma and other chronic lower respiratory diseases: Secondary | ICD-10-CM | POA: Diagnosis not present

## 2019-10-28 DIAGNOSIS — Z8249 Family history of ischemic heart disease and other diseases of the circulatory system: Secondary | ICD-10-CM | POA: Insufficient documentation

## 2019-10-28 DIAGNOSIS — O09523 Supervision of elderly multigravida, third trimester: Secondary | ICD-10-CM | POA: Diagnosis not present

## 2019-10-28 DIAGNOSIS — O219 Vomiting of pregnancy, unspecified: Secondary | ICD-10-CM

## 2019-10-28 DIAGNOSIS — Z91040 Latex allergy status: Secondary | ICD-10-CM | POA: Diagnosis not present

## 2019-10-28 DIAGNOSIS — Z3A29 29 weeks gestation of pregnancy: Secondary | ICD-10-CM | POA: Insufficient documentation

## 2019-10-28 LAB — COMPREHENSIVE METABOLIC PANEL
ALT: 20 U/L (ref 0–44)
AST: 20 U/L (ref 15–41)
Albumin: 3.3 g/dL — ABNORMAL LOW (ref 3.5–5.0)
Alkaline Phosphatase: 50 U/L (ref 38–126)
Anion gap: 11 (ref 5–15)
BUN: 6 mg/dL (ref 6–20)
CO2: 20 mmol/L — ABNORMAL LOW (ref 22–32)
Calcium: 9.1 mg/dL (ref 8.9–10.3)
Chloride: 105 mmol/L (ref 98–111)
Creatinine, Ser: 0.5 mg/dL (ref 0.44–1.00)
GFR calc Af Amer: >60 mL/min (ref >60)
GFR calc non Af Amer: >60 mL/min (ref >60)
Glucose, Bld: 117 mg/dL — ABNORMAL HIGH (ref 70–99)
Potassium: 3.8 mmol/L (ref 3.5–5.1)
Sodium: 136 mmol/L (ref 135–145)
Total Bilirubin: 0.6 mg/dL (ref 0.3–1.2)
Total Protein: 6.7 g/dL (ref 6.5–8.1)

## 2019-10-28 LAB — CBC WITH DIFFERENTIAL/PLATELET
Abs Immature Granulocytes: 0.05 10*3/uL (ref 0.00–0.07)
Basophils Absolute: 0 10*3/uL (ref 0.0–0.1)
Basophils Relative: 0 %
Eosinophils Absolute: 0.1 10*3/uL (ref 0.0–0.5)
Eosinophils Relative: 1 %
HCT: 35.5 % — ABNORMAL LOW (ref 36.0–46.0)
Hemoglobin: 12 g/dL (ref 12.0–15.0)
Immature Granulocytes: 1 %
Lymphocytes Relative: 20 %
Lymphs Abs: 1.2 10*3/uL (ref 0.7–4.0)
MCH: 29.9 pg (ref 26.0–34.0)
MCHC: 33.8 g/dL (ref 30.0–36.0)
MCV: 88.5 fL (ref 80.0–100.0)
Monocytes Absolute: 0.4 10*3/uL (ref 0.1–1.0)
Monocytes Relative: 6 %
Neutro Abs: 4.5 10*3/uL (ref 1.7–7.7)
Neutrophils Relative %: 72 %
Platelets: 261 10*3/uL (ref 150–400)
RBC: 4.01 MIL/uL (ref 3.87–5.11)
RDW: 11.9 % (ref 11.5–15.5)
WBC: 6.2 10*3/uL (ref 4.0–10.5)
nRBC: 0 % (ref 0.0–0.2)

## 2019-10-28 LAB — URINALYSIS, ROUTINE W REFLEX MICROSCOPIC
Bilirubin Urine: NEGATIVE
Glucose, UA: 150 mg/dL — AB
Hgb urine dipstick: NEGATIVE
Ketones, ur: 5 mg/dL — AB
Leukocytes,Ua: NEGATIVE
Nitrite: NEGATIVE
Protein, ur: 30 mg/dL — AB
Specific Gravity, Urine: 1.03 (ref 1.005–1.030)
pH: 5 (ref 5.0–8.0)

## 2019-10-28 LAB — PROTEIN / CREATININE RATIO, URINE
Creatinine, Urine: 227.4 mg/dL
Protein Creatinine Ratio: 0.08 mg/mg{Cre} (ref 0.00–0.15)
Total Protein, Urine: 18 mg/dL

## 2019-10-28 LAB — HIV ANTIBODY (ROUTINE TESTING W REFLEX): HIV Screen 4th Generation wRfx: NONREACTIVE

## 2019-10-28 MED ORDER — ACETAMINOPHEN 325 MG PO TABS
650.0000 mg | ORAL_TABLET | Freq: Four times a day (QID) | ORAL | Status: DC | PRN
  Administered 2019-10-28: 650 mg via ORAL
  Filled 2019-10-28: qty 2

## 2019-10-28 MED ORDER — LACTATED RINGERS IV BOLUS
1000.0000 mL | Freq: Once | INTRAVENOUS | Status: AC
  Administered 2019-10-28: 1000 mL via INTRAVENOUS

## 2019-10-28 MED ORDER — METOCLOPRAMIDE HCL 5 MG/ML IJ SOLN
10.0000 mg | Freq: Three times a day (TID) | INTRAMUSCULAR | Status: DC
  Administered 2019-10-28: 10 mg via INTRAVENOUS
  Filled 2019-10-28: qty 2

## 2019-10-28 MED ORDER — DOXYLAMINE-PYRIDOXINE 10-10 MG PO TBEC: 2.0000 | DELAYED_RELEASE_TABLET | Freq: Every evening | ORAL | 0 refills | Status: DC | PRN

## 2019-10-28 MED ORDER — METOCLOPRAMIDE HCL 5 MG/ML IJ SOLN: 10.0000 mg | Freq: Once | INTRAMUSCULAR | Status: DC

## 2019-10-28 NOTE — MAU Provider Note (Signed)
History     CSN: [id number]  Arrival date and time: 10/28/19 1255   First Provider Initiated Contact with Patient 10/28/19 1328      Chief Complaint  Patient presents with  . Nausea  . Emesis  . Headache   Ms. [name] is a 39 y.o. G4P3003 at [redacted]w[redacted]d who presents to MAU today from the office at [facility] for further evaluation of elevated blood pressure and ongoing N/V. The patient has CHTN and is currently taking Labetalol 200 mg BID. She states BP in the office was 140s/100. She has a headache and has not treated it.  She has not taken any pain medication today. She denies visual changes, abdominal pain or change in LE edema. She has been having the N/V ongoing throughout the pregnancy. She denies diarrhea, but has significant constipation. She is taking ODT Zofran for N/V. She is taking Metformin 500 mg for T2DM. Her fasting sugars have been wnl but her 2 hr pp are running 140s.   OB History    Gravida  4   Para  3   Term  3   Preterm  0   AB  0   Living  3     SAB  0   TAB  0   Ectopic  0   Multiple  0   Live Births  3           Past Medical History:  Diagnosis Date  . Abnormal Pap smear   . Asthma   . Depression   . GDM (gestational diabetes mellitus)   . H/O varicella   . Hx of bacterial infection   . Hyperemesis    During first pregnancy  . Hypertension   . Sinusitis   . Trichomonas 02/2011   treated  . Type 2 diabetes mellitus complicating pregnancy, antepartum   . Urinary tract infection   . Vaginal Pap smear, abnormal     Past Surgical History:  Procedure Laterality Date  . NO PAST SURGERIES      Family History  Problem Relation Age of Onset  . Diabetes Mother   . Hypertension Mother   . Peripheral vascular disease Mother   . Thyroid disease Mother   . Asthma Mother   . Diabetes Father   . Hypertension Father   . Seizures Maternal Aunt   . Alcohol abuse Maternal Aunt   . Heart attack Maternal Uncle   . Kidney disease Maternal  Uncle   . Cancer Maternal Uncle        prostate  . Thyroid disease Paternal Aunt   . Heart attack Paternal Uncle   . Kidney disease Paternal Uncle   . Anesthesia problems Neg Hx   . Hypotension Neg Hx   . Malignant hyperthermia Neg Hx   . Pseudochol deficiency Neg Hx     Social History   Tobacco Use  . Smoking status: Never Smoker  . Smokeless tobacco: Never Used  Substance Use Topics  . Alcohol use: No  . Drug use: No    Allergies:  Allergies  Allergen Reactions  . Flagyl [Metronidazole Hcl] Swelling  . Latex Hives  . Other Other (See Comments)    Pt has a reaction to plastic tape. She breaks out with a rash and her skin peels.    Medications Prior to Admission  Medication Sig Dispense Refill Last Dose  . Acetaminophen (TYLENOL PO) Take by mouth.   10/27/2019 at Unknown time  . docusate sodium (COLACE) 100 MG  capsule Take 200 mg by mouth daily as needed for mild constipation.    10/27/2019 at Unknown time  . FLUOXETINE HCL PO Take by mouth.   10/27/2019 at Unknown time  . metFORMIN (GLUCOPHAGE) 500 MG tablet Take by mouth 2 (two) times daily with a meal.   10/28/2019 at Unknown time  . pantoprazole (PROTONIX) 40 MG tablet Take 40 mg by mouth daily.   10/27/2019 at Unknown time  . Prenatal Vit-Fe Fumarate-FA (PRENATAL MULTIVITAMIN) TABS tablet Take 1 tablet by mouth daily.    10/27/2019 at Unknown time  . doxylamine, Sleep, (UNISOM) 25 MG tablet Take 25 mg by mouth at bedtime.     Marland Kitchen ibuprofen (ADVIL,MOTRIN) 600 MG tablet Take 1 tablet (600 mg total) by mouth every 6 (six) hours as needed. (Patient not taking: Reported on 08/20/2019) 36 tablet 2   . oxyCODONE-acetaminophen (PERCOCET/ROXICET) 5-325 MG per tablet Take 1-2 tablets by mouth every 4 (four) hours as needed for severe pain (moderate - severe pain). (Patient not taking: Reported on 09/20/2019) 30 tablet 0   . sertraline (ZOLOFT) 50 MG tablet Take 100 mg by mouth daily.       Review of Systems  Constitutional: Negative  for appetite change and fatigue.  HENT: Negative.   Eyes: Negative.   Respiratory: Negative.   Cardiovascular: Negative.   Gastrointestinal: Positive for constipation, nausea and vomiting.  Endocrine: Negative.   Genitourinary: Negative.   Musculoskeletal: Negative.   Skin: Negative.   Allergic/Immunologic: Negative.   Neurological: Negative.   Hematological: Negative.   Psychiatric/Behavioral: Negative.    Physical Exam   Blood pressure 122/74, pulse 99, temperature 98 F (36.7 C), resp. rate (!) 22, height 5\' 3"  (1.6 m), weight 123.4 kg, last menstrual period 04/06/2019, SpO2 98 %, unknown if currently breastfeeding. FHT BL 155 variability present, no decels No contractions noted   Results for orders placed or performed during the hospital encounter of 10/28/19 (from the past 24 hour(s))  Urinalysis, Routine w reflex microscopic     Status: Abnormal   Collection Time: 10/28/19  1:42 PM  Result Value Ref Range   Color, Urine YELLOW YELLOW   APPearance CLEAR CLEAR   Specific Gravity, Urine 1.030 1.005 - 1.030   pH 5.0 5.0 - 8.0   Glucose, UA 150 (A) NEGATIVE mg/dL   Hgb urine dipstick NEGATIVE NEGATIVE   Bilirubin Urine NEGATIVE NEGATIVE   Ketones, ur 5 (A) NEGATIVE mg/dL   Protein, ur 30 (A) NEGATIVE mg/dL   Nitrite NEGATIVE NEGATIVE   Leukocytes,Ua NEGATIVE NEGATIVE   RBC / HPF 0-5 0 - 5 RBC/hpf   WBC, UA 0-5 0 - 5 WBC/hpf   Bacteria, UA RARE (A) NONE SEEN   Squamous Epithelial / LPF 6-10 0 - 5   Mucus PRESENT   Protein / creatinine ratio, urine     Status: None   Collection Time: 10/28/19  1:42 PM  Result Value Ref Range   Creatinine, Urine 227.40 mg/dL   Total Protein, Urine 18 mg/dL   Protein Creatinine Ratio 0.08 0.00 - 0.15 mg/mg[Cre]  CBC with Differential/Platelet     Status: Abnormal   Collection Time: 10/28/19  2:04 PM  Result Value Ref Range   WBC 6.2 4.0 - 10.5 K/uL   RBC 4.01 3.87 - 5.11 MIL/uL   Hemoglobin 12.0 12.0 - 15.0 g/dL   HCT 35.5 (L) 36.0  - 46.0 %   MCV 88.5 80.0 - 100.0 fL   MCH 29.9 26.0 - 34.0 pg  MCHC 33.8 30.0 - 36.0 g/dL   RDW 11.9 11.5 - 15.5 %   Platelets 261 150 - 400 K/uL   nRBC 0.0 0.0 - 0.2 %   Neutrophils Relative % 72 %   Neutro Abs 4.5 1.7 - 7.7 K/uL   Lymphocytes Relative 20 %   Lymphs Abs 1.2 0.7 - 4.0 K/uL   Monocytes Relative 6 %   Monocytes Absolute 0.4 0.1 - 1.0 K/uL   Eosinophils Relative 1 %   Eosinophils Absolute 0.1 0.0 - 0.5 K/uL   Basophils Relative 0 %   Basophils Absolute 0.0 0.0 - 0.1 K/uL   Immature Granulocytes 1 %   Abs Immature Granulocytes 0.05 0.00 - 0.07 K/uL   Physical Exam  Constitutional: She is oriented to person, place, and time. She appears well-developed and well-nourished. No distress.  HENT:  Head: Normocephalic.  Cardiovascular: Normal rate.  Respiratory: Effort normal.  GI: There is no abdominal tenderness. There is no rebound.  Musculoskeletal:        General: Normal range of motion.     Cervical back: Neck supple.  Neurological: She is alert and oriented to person, place, and time.  Skin: Skin is warm and dry.  Psychiatric: She has a normal mood and affect. Her behavior is normal. Thought content normal.    MAU Course  Procedures IV bolus of LR, CMP, CBC, UPC, HIV, RPR MDM NST reactive, IV bolus and IV Reglan given.  Labs reviewed and negative  Assessment and Plan  G4P3003 at 29.2 IUP with nausea and vomiting CHTN in pregnancy T2DM  Cat 1 strip  Pleas Koch Stein [place]st 10/28/2019, 2:42 PM

## 2019-10-28 NOTE — Discharge Instructions (Signed)
Take medication as prescribed.  Try to stay hydrated.    Hyperemesis Gravidarum Hyperemesis gravidarum is a severe form of nausea and vomiting that happens during pregnancy. Hyperemesis is worse than morning sickness. It may cause you to have nausea or vomiting all day for many days. It may keep you from eating and drinking enough food and liquids, which can lead to dehydration, malnutrition, and weight loss. Hyperemesis usually occurs during the first half (the first 20 weeks) of pregnancy. It often goes away once a woman is in her second half of pregnancy. However, sometimes hyperemesis continues through an entire pregnancy. What are the causes? The cause of this condition is not known. It may be related to changes in chemicals (hormones) in the body during pregnancy, such as the high level of pregnancy hormone (human chorionic gonadotropin) or the increase in the female sex hormone (estrogen). What are the signs or symptoms? Symptoms of this condition include:  Nausea that does not go away.  Vomiting that does not allow you to keep any food down.  Weight loss.  Body fluid loss (dehydration).  Having no desire to eat, or not liking food that you have previously enjoyed. How is this diagnosed? This condition may be diagnosed based on:  A physical exam.  Your medical history.  Your symptoms.  Blood tests.  Urine tests. How is this treated? This condition is managed by controlling symptoms. This may include:  Following an eating plan. This can help lessen nausea and vomiting.  Taking prescription medicines. An eating plan and medicines are often used together to help control symptoms. If medicines do not help relieve nausea and vomiting, you may need to receive fluids through an IV at the hospital. Follow these instructions at home: Eating and drinking   Avoid the following: ? Drinking fluids with meals. Try not to drink anything during the 30 minutes before and after your  meals. ? Drinking more than 1 cup of fluid at a time. ? Eating foods that trigger your symptoms. These may include spicy foods, coffee, high-fat foods, very sweet foods, and acidic foods. ? Skipping meals. Nausea can be more intense on an empty stomach. If you cannot tolerate food, do not force it. Try sucking on ice chips or other frozen items and make up for missed calories later. ? Lying down within 2 hours after eating. ? Being exposed to environmental triggers. These may include food smells, smoky rooms, closed spaces, rooms with strong smells, warm or humid places, overly loud and noisy rooms, and rooms with motion or flickering lights. Try eating meals in a well-ventilated area that is free of strong smells. ? Quick and sudden changes in your movement. ? Taking iron pills and multivitamins that contain iron. If you take prescription iron pills, do not stop taking them unless your health care provider approves. ? Preparing food. The smell of food can spoil your appetite or trigger nausea.  To help relieve your symptoms: ? Listen to your body. Everyone is different and has different preferences. Find what works best for you. ? Eat and drink slowly. ? Eat 5-6 small meals daily instead of 3 large meals. Eating small meals and snacks can help you avoid an empty stomach. ? In the morning, before getting out of bed, eat a couple of crackers to avoid moving around on an empty stomach. ? Try eating starchy foods as these are usually tolerated well. Examples include cereal, toast, bread, potatoes, pasta, rice, and pretzels. ? Include at least  1 serving of protein with your meals and snacks. Protein options include lean meats, poultry, seafood, beans, nuts, nut butters, eggs, cheese, and yogurt. ? Try eating a protein-rich snack before bed. Examples of a protein-rick snack include cheese and crackers or a peanut butter sandwich made with 1 slice of whole-wheat bread and 1 tsp (5 g) of peanut  butter. ? Eat or suck on things that have ginger in them. It may help relieve nausea. Add  tsp ground ginger to hot tea or choose ginger tea. ? Try drinking 100% fruit juice or an electrolyte drink. An electrolyte drink contains sodium, potassium, and chloride. ? Drink fluids that are cold, clear, and carbonated or sour. Examples include lemonade, ginger ale, lemon-lime soda, ice water, and sparkling water. ? Brush your teeth or use a mouth rinse after meals. ? Talk with your health care provider about starting a supplement of vitamin B6. General instructions  Take over-the-counter and prescription medicines only as told by your health care provider.  Follow instructions from your health care provider about eating or drinking restrictions.  Continue to take your prenatal vitamins as told by your health care provider. If you are having trouble taking your prenatal vitamins, talk with your health care provider about different options.  Keep all follow-up and pre-birth (prenatal) visits as told by your health care provider. This is important. Contact a health care provider if:  You have pain in your abdomen.  You have a severe headache.  You have vision problems.  You are losing weight.  You feel weak or dizzy. Get help right away if:  You cannot drink fluids without vomiting.  You vomit blood.  You have constant nausea and vomiting.  You are very weak.  You faint.  You have a fever and your symptoms suddenly get worse. Summary  Hyperemesis gravidarum is a severe form of nausea and vomiting that happens during pregnancy.  Making some changes to your eating habits may help relieve nausea and vomiting.  This condition may be managed with medicine.  If medicines do not help relieve nausea and vomiting, you may need to receive fluids through an IV at the hospital. This information is not intended to replace advice given to you by your health care provider. Make sure you  discuss any questions you have with your health care provider. Document Revised: 09/08/2017 Document Reviewed: 04/17/2016 Elsevier Patient Education  [place].

## 2019-10-28 NOTE — MAU Note (Signed)
.   [name] is a 39 y.o. at [redacted]w[redacted]d here in MAU reporting: she was evaluated in the office today and her blood pressure was elevated.States she started zofran a couple of weeks ago and has been constipated. Vomiting earlier today at the office and has a severe headache  Onset of complaint: ongoing  Pain score: 10 Vitals:   10/28/19 1314 10/28/19 1318  BP:  (!) 141/71  Pulse:  (!) 108  Resp:  (!) 22  Temp:  98 F (36.7 C)  SpO2: 97%      FHT:185 Lab orders placed from triage: UA

## 2019-10-28 NOTE — MAU Provider Note (Signed)
History     CSN: [id number]  Arrival date and time: 10/28/19 1255   First Provider Initiated Contact with Patient 10/28/19 1328      Chief Complaint  Patient presents with  . Nausea  . Emesis  . Headache   HPI Ms. [name] is a 39 y.o. G4P3003 at [redacted]w[redacted]d who presents to MAU today from the office at [facility] for further evaluation of elevated blood pressure and ongoing N/V. The patient has [facility] and is not currently on medications. She states BP in the office was 140s/100. She has a headache. She has not taken any pain medication today. She denies visual changes, abdominal pain or change in LE edema. She has been having the N/V ongoing throughout the pregnancy. She denies diarrhea, but has significant constipation. She is taking ODT Zofran for N/V. She is taking Metformin for T2DM.   OB History    Gravida  4   Para  3   Term  3   Preterm  0   AB  0   Living  3     SAB  0   TAB  0   Ectopic  0   Multiple  0   Live Births  3           Past Medical History:  Diagnosis Date  . Abnormal Pap smear   . Asthma   . Depression   . GDM (gestational diabetes mellitus)   . H/O varicella   . Hx of bacterial infection   . Hyperemesis    During first pregnancy  . Hypertension   . Sinusitis   . Trichomonas 02/2011   treated  . Type 2 diabetes mellitus complicating pregnancy, antepartum   . Urinary tract infection   . Vaginal Pap smear, abnormal     Past Surgical History:  Procedure Laterality Date  . NO PAST SURGERIES      Family History  Problem Relation Age of Onset  . Diabetes Mother   . Hypertension Mother   . Peripheral vascular disease Mother   . Thyroid disease Mother   . Asthma Mother   . Diabetes Father   . Hypertension Father   . Seizures Maternal Aunt   . Alcohol abuse Maternal Aunt   . Heart attack Maternal Uncle   . Kidney disease Maternal Uncle   . Cancer Maternal Uncle        prostate  . Thyroid disease Paternal Aunt   . Heart attack  Paternal Uncle   . Kidney disease Paternal Uncle   . Anesthesia problems Neg Hx   . Hypotension Neg Hx   . Malignant hyperthermia Neg Hx   . Pseudochol deficiency Neg Hx     Social History   Tobacco Use  . Smoking status: Never Smoker  . Smokeless tobacco: Never Used  Substance Use Topics  . Alcohol use: No  . Drug use: No    Allergies:  Allergies  Allergen Reactions  . Flagyl [Metronidazole Hcl] Swelling  . Latex Hives  . Other Other (See Comments)    Pt has a reaction to plastic tape. She breaks out with a rash and her skin peels.    Medications Prior to Admission  Medication Sig Dispense Refill Last Dose  . Acetaminophen (TYLENOL PO) Take by mouth.   10/27/2019 at Unknown time  . docusate sodium (COLACE) 100 MG capsule Take 200 mg by mouth daily as needed for mild constipation.    10/27/2019 at Unknown time  . FLUOXETINE HCL  PO Take by mouth.   10/27/2019 at Unknown time  . metFORMIN (GLUCOPHAGE) 500 MG tablet Take by mouth 2 (two) times daily with a meal.   10/28/2019 at Unknown time  . pantoprazole (PROTONIX) 40 MG tablet Take 40 mg by mouth daily.   10/27/2019 at Unknown time  . Prenatal Vit-Fe Fumarate-FA (PRENATAL MULTIVITAMIN) TABS tablet Take 1 tablet by mouth daily.    10/27/2019 at Unknown time  . doxylamine, Sleep, (UNISOM) 25 MG tablet Take 25 mg by mouth at bedtime.     Marland Kitchen ibuprofen (ADVIL,MOTRIN) 600 MG tablet Take 1 tablet (600 mg total) by mouth every 6 (six) hours as needed. (Patient not taking: Reported on 08/20/2019) 36 tablet 2   . oxyCODONE-acetaminophen (PERCOCET/ROXICET) 5-325 MG per tablet Take 1-2 tablets by mouth every 4 (four) hours as needed for severe pain (moderate - severe pain). (Patient not taking: Reported on 09/20/2019) 30 tablet 0   . sertraline (ZOLOFT) 50 MG tablet Take 100 mg by mouth daily.       Review of Systems  Constitutional: Negative for fever.  Eyes: Negative for visual disturbance.  Cardiovascular: Positive for leg swelling.   Gastrointestinal: Positive for constipation, nausea and vomiting. Negative for abdominal pain and diarrhea.  Genitourinary: Negative for vaginal bleeding and vaginal discharge.  Neurological: Positive for headaches.   Physical Exam   Blood pressure (!) 141/71, pulse (!) 108, temperature 98 F (36.7 C), resp. rate (!) 22, height 5\' 3"  (1.6 m), weight 123.4 kg, last menstrual period 04/06/2019, SpO2 97 %, unknown if currently breastfeeding.  Physical Exam  Nursing note and vitals reviewed. Constitutional: She is oriented to person, place, and time. She appears well-developed and well-nourished. No distress.  HENT:  Head: Normocephalic and atraumatic.  Cardiovascular: Normal rate.  Respiratory: Effort normal.  GI: Soft. She exhibits no distension.  Musculoskeletal:        General: Edema present.  Neurological: She is alert and oriented to person, place, and time. She has normal reflexes.  Skin: Skin is warm and dry. No erythema.  Psychiatric: She has a normal mood and affect.    MAU Course  Procedures  None  MDM CBC, CMP, UA and urine protein/creatinine ratio IV LR bolus ordered.  Immediately following initial evaluation of patient, CNM from [place] arrived to assume care of the patient.  1340 - care turned over to [facility], CNM  Kerry Hough, PA-C 10/28/2019, 1:42 PM  Assessment and Plan

## 2019-10-29 LAB — RPR: RPR Ser Ql: NONREACTIVE

## 2019-11-22 ENCOUNTER — Other Ambulatory Visit: Payer: Self-pay

## 2019-11-22 ENCOUNTER — Ambulatory Visit (HOSPITAL_COMMUNITY)
Admission: RE | Admit: 2019-11-22 | Discharge: 2019-11-22 | Disposition: A | Discharge status: Home / Self Care | Payer: 59 | Source: Ambulatory Visit | Attending: Obstetrics and Gynecology | Admitting: Obstetrics and Gynecology

## 2019-11-22 ENCOUNTER — Encounter (HOSPITAL_COMMUNITY): Payer: Self-pay

## 2019-11-22 ENCOUNTER — Ambulatory Visit (HOSPITAL_COMMUNITY): Payer: 59 | Admitting: *Deleted

## 2019-11-22 VITALS — BP 150/91 | HR 115 | Temp 97.3°F

## 2019-11-22 DIAGNOSIS — O24119 Pre-existing diabetes mellitus, type 2, in pregnancy, unspecified trimester: Secondary | ICD-10-CM | POA: Diagnosis not present

## 2019-11-22 DIAGNOSIS — Z362 Encounter for other antenatal screening follow-up: Secondary | ICD-10-CM

## 2019-11-22 DIAGNOSIS — O99213 Obesity complicating pregnancy, third trimester: Secondary | ICD-10-CM

## 2019-11-22 DIAGNOSIS — O24113 Pre-existing diabetes mellitus, type 2, in pregnancy, third trimester: Secondary | ICD-10-CM

## 2019-11-22 DIAGNOSIS — O10013 Pre-existing essential hypertension complicating pregnancy, third trimester: Secondary | ICD-10-CM

## 2019-11-22 DIAGNOSIS — O0990 Supervision of high risk pregnancy, unspecified, unspecified trimester: Secondary | ICD-10-CM

## 2019-11-22 DIAGNOSIS — O289 Unspecified abnormal findings on antenatal screening of mother: Secondary | ICD-10-CM

## 2019-11-22 DIAGNOSIS — E669 Obesity, unspecified: Secondary | ICD-10-CM

## 2019-11-22 DIAGNOSIS — Z3A32 32 weeks gestation of pregnancy: Secondary | ICD-10-CM

## 2019-11-22 DIAGNOSIS — Z7984 Long term (current) use of oral hypoglycemic drugs: Secondary | ICD-10-CM

## 2019-11-29 ENCOUNTER — Encounter (HOSPITAL_COMMUNITY): Payer: Self-pay | Admitting: *Deleted

## 2019-11-29 ENCOUNTER — Ambulatory Visit (HOSPITAL_COMMUNITY): Payer: 59 | Admitting: *Deleted

## 2019-11-29 ENCOUNTER — Other Ambulatory Visit: Payer: Self-pay

## 2019-11-29 ENCOUNTER — Ambulatory Visit (HOSPITAL_COMMUNITY)
Admission: RE | Admit: 2019-11-29 | Discharge: 2019-11-29 | Disposition: A | Discharge status: Home / Self Care | Payer: 59 | Source: Ambulatory Visit | Attending: Obstetrics and Gynecology | Admitting: Obstetrics and Gynecology

## 2019-11-29 VITALS — BP 146/78 | HR 115 | Temp 97.8°F

## 2019-11-29 DIAGNOSIS — Z3A33 33 weeks gestation of pregnancy: Secondary | ICD-10-CM

## 2019-11-29 DIAGNOSIS — O24113 Pre-existing diabetes mellitus, type 2, in pregnancy, third trimester: Secondary | ICD-10-CM

## 2019-11-29 DIAGNOSIS — O99213 Obesity complicating pregnancy, third trimester: Secondary | ICD-10-CM

## 2019-11-29 DIAGNOSIS — I10 Essential (primary) hypertension: Secondary | ICD-10-CM | POA: Insufficient documentation

## 2019-11-29 DIAGNOSIS — O10019 Pre-existing essential hypertension complicating pregnancy, unspecified trimester: Secondary | ICD-10-CM

## 2019-11-29 DIAGNOSIS — O24119 Pre-existing diabetes mellitus, type 2, in pregnancy, unspecified trimester: Secondary | ICD-10-CM | POA: Diagnosis not present

## 2019-11-29 DIAGNOSIS — O289 Unspecified abnormal findings on antenatal screening of mother: Secondary | ICD-10-CM

## 2019-12-06 ENCOUNTER — Other Ambulatory Visit (HOSPITAL_COMMUNITY): Payer: Self-pay | Admitting: *Deleted

## 2019-12-06 ENCOUNTER — Ambulatory Visit (HOSPITAL_COMMUNITY)
Admission: RE | Admit: 2019-12-06 | Discharge: 2019-12-06 | Disposition: A | Discharge status: Home / Self Care | Payer: 59 | Source: Ambulatory Visit | Attending: Obstetrics and Gynecology | Admitting: Obstetrics and Gynecology

## 2019-12-06 ENCOUNTER — Other Ambulatory Visit: Payer: Self-pay

## 2019-12-06 ENCOUNTER — Encounter (HOSPITAL_COMMUNITY): Payer: Self-pay | Admitting: *Deleted

## 2019-12-06 ENCOUNTER — Ambulatory Visit (HOSPITAL_COMMUNITY): Payer: 59 | Admitting: *Deleted

## 2019-12-06 VITALS — BP 123/82 | HR 115 | Temp 97.8°F

## 2019-12-06 DIAGNOSIS — O24319 Unspecified pre-existing diabetes mellitus in pregnancy, unspecified trimester: Secondary | ICD-10-CM

## 2019-12-06 DIAGNOSIS — O24119 Pre-existing diabetes mellitus, type 2, in pregnancy, unspecified trimester: Secondary | ICD-10-CM | POA: Diagnosis not present

## 2019-12-06 DIAGNOSIS — O09529 Supervision of elderly multigravida, unspecified trimester: Secondary | ICD-10-CM | POA: Insufficient documentation

## 2019-12-06 DIAGNOSIS — Z3A34 34 weeks gestation of pregnancy: Secondary | ICD-10-CM

## 2019-12-06 DIAGNOSIS — O24113 Pre-existing diabetes mellitus, type 2, in pregnancy, third trimester: Secondary | ICD-10-CM

## 2019-12-06 DIAGNOSIS — O289 Unspecified abnormal findings on antenatal screening of mother: Secondary | ICD-10-CM

## 2019-12-06 DIAGNOSIS — O99213 Obesity complicating pregnancy, third trimester: Secondary | ICD-10-CM

## 2019-12-06 DIAGNOSIS — O10019 Pre-existing essential hypertension complicating pregnancy, unspecified trimester: Secondary | ICD-10-CM

## 2019-12-07 ENCOUNTER — Encounter (HOSPITAL_COMMUNITY): Payer: Self-pay | Admitting: *Deleted

## 2019-12-07 ENCOUNTER — Telehealth (HOSPITAL_COMMUNITY): Payer: Self-pay | Admitting: *Deleted

## 2019-12-07 NOTE — Telephone Encounter (Signed)
Preadmission screen  

## 2019-12-09 ENCOUNTER — Telehealth (HOSPITAL_COMMUNITY): Payer: Self-pay | Admitting: *Deleted

## 2019-12-09 NOTE — Telephone Encounter (Signed)
Preadmission screen  

## 2019-12-15 ENCOUNTER — Ambulatory Visit (HOSPITAL_COMMUNITY): Payer: 59

## 2019-12-15 ENCOUNTER — Encounter (HOSPITAL_COMMUNITY): Payer: Self-pay

## 2019-12-15 ENCOUNTER — Ambulatory Visit (HOSPITAL_COMMUNITY): Admission: RE | Admit: 2019-12-15 | Payer: 59 | Source: Ambulatory Visit

## 2019-12-17 ENCOUNTER — Other Ambulatory Visit: Payer: Self-pay | Admitting: Obstetrics and Gynecology

## 2019-12-17 LAB — OB RESULTS CONSOLE GBS: GBS: POSITIVE

## 2019-12-18 ENCOUNTER — Other Ambulatory Visit (HOSPITAL_COMMUNITY): Payer: 59

## 2019-12-20 ENCOUNTER — Other Ambulatory Visit (HOSPITAL_COMMUNITY)
Admission: RE | Admit: 2019-12-20 | Discharge: 2019-12-20 | Disposition: A | Discharge status: Home / Self Care | Payer: 59 | Source: Ambulatory Visit | Attending: Obstetrics and Gynecology | Admitting: Obstetrics and Gynecology

## 2019-12-20 LAB — SARS CORONAVIRUS 2 (TAT 6-24 HRS): SARS Coronavirus 2: NEGATIVE

## 2019-12-21 ENCOUNTER — Inpatient Hospital Stay (HOSPITAL_COMMUNITY)
Admission: AD | Admit: 2019-12-21 | Discharge: 2019-12-23 | DRG: 805 | Disposition: A | Discharge status: Home / Self Care | Payer: 59 | Attending: Obstetrics & Gynecology | Admitting: Obstetrics & Gynecology

## 2019-12-21 ENCOUNTER — Encounter (HOSPITAL_COMMUNITY): Payer: Self-pay | Admitting: Anesthesiology

## 2019-12-21 ENCOUNTER — Other Ambulatory Visit: Payer: Self-pay

## 2019-12-21 ENCOUNTER — Encounter (HOSPITAL_COMMUNITY): Payer: Self-pay | Admitting: Obstetrics and Gynecology

## 2019-12-21 ENCOUNTER — Inpatient Hospital Stay (HOSPITAL_COMMUNITY): Payer: 59

## 2019-12-21 DIAGNOSIS — O2412 Pre-existing diabetes mellitus, type 2, in childbirth: Secondary | ICD-10-CM | POA: Diagnosis present

## 2019-12-21 DIAGNOSIS — O1002 Pre-existing essential hypertension complicating childbirth: Secondary | ICD-10-CM | POA: Diagnosis present

## 2019-12-21 DIAGNOSIS — Z7984 Long term (current) use of oral hypoglycemic drugs: Secondary | ICD-10-CM

## 2019-12-21 DIAGNOSIS — F419 Anxiety disorder, unspecified: Secondary | ICD-10-CM | POA: Diagnosis present

## 2019-12-21 DIAGNOSIS — O24919 Unspecified diabetes mellitus in pregnancy, unspecified trimester: Secondary | ICD-10-CM | POA: Diagnosis present

## 2019-12-21 DIAGNOSIS — E119 Type 2 diabetes mellitus without complications: Secondary | ICD-10-CM | POA: Diagnosis present

## 2019-12-21 DIAGNOSIS — O99214 Obesity complicating childbirth: Secondary | ICD-10-CM | POA: Diagnosis present

## 2019-12-21 DIAGNOSIS — O3413 Maternal care for benign tumor of corpus uteri, third trimester: Secondary | ICD-10-CM | POA: Diagnosis present

## 2019-12-21 DIAGNOSIS — D259 Leiomyoma of uterus, unspecified: Secondary | ICD-10-CM | POA: Diagnosis present

## 2019-12-21 DIAGNOSIS — Z6841 Body Mass Index (BMI) 40.0 and over, adult: Secondary | ICD-10-CM

## 2019-12-21 DIAGNOSIS — Z20822 Contact with and (suspected) exposure to covid-19: Secondary | ICD-10-CM | POA: Diagnosis present

## 2019-12-21 DIAGNOSIS — Z3490 Encounter for supervision of normal pregnancy, unspecified, unspecified trimester: Secondary | ICD-10-CM | POA: Diagnosis present

## 2019-12-21 DIAGNOSIS — O99344 Other mental disorders complicating childbirth: Secondary | ICD-10-CM | POA: Diagnosis present

## 2019-12-21 DIAGNOSIS — F329 Major depressive disorder, single episode, unspecified: Secondary | ICD-10-CM | POA: Diagnosis present

## 2019-12-21 DIAGNOSIS — O365930 Maternal care for other known or suspected poor fetal growth, third trimester, not applicable or unspecified: Secondary | ICD-10-CM | POA: Diagnosis present

## 2019-12-21 DIAGNOSIS — O99824 Streptococcus B carrier state complicating childbirth: Secondary | ICD-10-CM | POA: Diagnosis present

## 2019-12-21 DIAGNOSIS — O10919 Unspecified pre-existing hypertension complicating pregnancy, unspecified trimester: Secondary | ICD-10-CM | POA: Diagnosis present

## 2019-12-21 DIAGNOSIS — Z3A37 37 weeks gestation of pregnancy: Secondary | ICD-10-CM | POA: Diagnosis not present

## 2019-12-21 DIAGNOSIS — Z91040 Latex allergy status: Secondary | ICD-10-CM | POA: Diagnosis not present

## 2019-12-21 DIAGNOSIS — F32A Depression, unspecified: Secondary | ICD-10-CM | POA: Diagnosis present

## 2019-12-21 LAB — CBC
HCT: 36.5 % (ref 36.0–46.0)
Hemoglobin: 12.7 g/dL (ref 12.0–15.0)
MCH: 30 pg (ref 26.0–34.0)
MCHC: 34.8 g/dL (ref 30.0–36.0)
MCV: 86.3 fL (ref 80.0–100.0)
Platelets: 242 10*3/uL (ref 150–400)
RBC: 4.23 MIL/uL (ref 3.87–5.11)
RDW: 12.3 % (ref 11.5–15.5)
WBC: 4.2 10*3/uL (ref 4.0–10.5)
nRBC: 0 % (ref 0.0–0.2)

## 2019-12-21 LAB — COMPREHENSIVE METABOLIC PANEL
ALT: 21 U/L (ref 0–44)
AST: 23 U/L (ref 15–41)
Albumin: 3.2 g/dL — ABNORMAL LOW (ref 3.5–5.0)
Alkaline Phosphatase: 103 U/L (ref 38–126)
Anion gap: 10 (ref 5–15)
BUN: 9 mg/dL (ref 6–20)
CO2: 20 mmol/L — ABNORMAL LOW (ref 22–32)
Calcium: 9.4 mg/dL (ref 8.9–10.3)
Chloride: 105 mmol/L (ref 98–111)
Creatinine, Ser: 0.72 mg/dL (ref 0.44–1.00)
GFR calc Af Amer: >60 mL/min (ref >60)
GFR calc non Af Amer: >60 mL/min (ref >60)
Glucose, Bld: 128 mg/dL — ABNORMAL HIGH (ref 70–99)
Potassium: 4 mmol/L (ref 3.5–5.1)
Sodium: 135 mmol/L (ref 135–145)
Total Bilirubin: 0.5 mg/dL (ref 0.3–1.2)
Total Protein: 6.5 g/dL (ref 6.5–8.1)

## 2019-12-21 LAB — PROTEIN / CREATININE RATIO, URINE
Creatinine, Urine: 211.6 mg/dL
Protein Creatinine Ratio: 0.38 mg/mg{Cre} — ABNORMAL HIGH (ref 0.00–0.15)
Total Protein, Urine: 80 mg/dL

## 2019-12-21 LAB — TYPE AND SCREEN
ABO/RH(D): A POS
Antibody Screen: NEGATIVE

## 2019-12-21 LAB — GLUCOSE, CAPILLARY
Glucose-Capillary: 134 mg/dL — ABNORMAL HIGH (ref 70–99)
Glucose-Capillary: 131 mg/dL — ABNORMAL HIGH (ref 70–99)
Glucose-Capillary: 96 mg/dL (ref 70–99)
Glucose-Capillary: 86 mg/dL (ref 70–99)

## 2019-12-21 LAB — ABO/RH: ABO/RH(D): A POS

## 2019-12-21 MED ORDER — ZOLPIDEM TARTRATE 5 MG PO TABS: 5.0000 mg | ORAL_TABLET | Freq: Every evening | ORAL | Status: DC | PRN

## 2019-12-21 MED ORDER — ONDANSETRON HCL 4 MG PO TABS: 4.0000 mg | ORAL_TABLET | ORAL | Status: DC | PRN

## 2019-12-21 MED ORDER — OXYTOCIN 10 UNIT/ML IJ SOLN: 10.0000 [IU] | Freq: Once | INTRAMUSCULAR | Status: DC

## 2019-12-21 MED ORDER — NIFEDIPINE ER OSMOTIC RELEASE 30 MG PO TB24
30.0000 mg | ORAL_TABLET | Freq: Every day | ORAL | Status: DC
  Administered 2019-12-21 – 2019-12-22 (×2): 30 mg via ORAL
  Filled 2019-12-21 (×2): qty 1

## 2019-12-21 MED ORDER — HYDROXYZINE HCL 25 MG PO TABS
50.0000 mg | ORAL_TABLET | Freq: Four times a day (QID) | ORAL | Status: DC | PRN
  Administered 2019-12-21: 50 mg via ORAL
  Filled 2019-12-21: qty 1

## 2019-12-21 MED ORDER — ONDANSETRON HCL 4 MG/2ML IJ SOLN: 4.0000 mg | INTRAMUSCULAR | Status: DC | PRN

## 2019-12-21 MED ORDER — DIPHENHYDRAMINE HCL 25 MG PO CAPS: 25.0000 mg | ORAL_CAPSULE | Freq: Four times a day (QID) | ORAL | Status: DC | PRN

## 2019-12-21 MED ORDER — METFORMIN HCL ER 500 MG PO TB24
1000.0000 mg | ORAL_TABLET | Freq: Every day | ORAL | Status: DC
  Administered 2019-12-21 (×2): 500 mg via ORAL
  Filled 2019-12-21 (×3): qty 2

## 2019-12-21 MED ORDER — FAMOTIDINE IN NACL 20-0.9 MG/50ML-% IV SOLN
20.0000 mg | Freq: Every day | INTRAVENOUS | Status: DC
  Administered 2019-12-21: 20 mg via INTRAVENOUS
  Filled 2019-12-21: qty 50

## 2019-12-21 MED ORDER — ONDANSETRON HCL 4 MG/2ML IJ SOLN: 4.0000 mg | Freq: Four times a day (QID) | INTRAMUSCULAR | Status: DC | PRN

## 2019-12-21 MED ORDER — SOD CITRATE-CITRIC ACID 500-334 MG/5ML PO SOLN: 30.0000 mL | ORAL | Status: DC | PRN

## 2019-12-21 MED ORDER — DIPHENHYDRAMINE HCL 50 MG/ML IJ SOLN: 12.5000 mg | INTRAMUSCULAR | Status: DC | PRN

## 2019-12-21 MED ORDER — FENTANYL-BUPIVACAINE-NACL 0.5-0.125-0.9 MG/250ML-% EP SOLN
12.0000 mL/h | EPIDURAL | Status: DC | PRN
  Filled 2019-12-21: qty 250

## 2019-12-21 MED ORDER — FAMOTIDINE 20 MG PO TABS
20.0000 mg | ORAL_TABLET | Freq: Two times a day (BID) | ORAL | Status: DC
  Administered 2019-12-21 – 2019-12-23 (×4): 20 mg via ORAL
  Filled 2019-12-21 (×4): qty 1

## 2019-12-21 MED ORDER — PHENYLEPHRINE 40 MCG/ML (10ML) SYRINGE FOR IV PUSH (FOR BLOOD PRESSURE SUPPORT): 80.0000 ug | PREFILLED_SYRINGE | INTRAVENOUS | Status: DC | PRN

## 2019-12-21 MED ORDER — COCONUT OIL OIL: 1.0000 "application " | TOPICAL_OIL | Status: DC | PRN

## 2019-12-21 MED ORDER — METFORMIN HCL ER 500 MG PO TB24
500.0000 mg | ORAL_TABLET | Freq: Every day | ORAL | Status: DC
  Administered 2019-12-22 – 2019-12-23 (×2): 500 mg via ORAL
  Filled 2019-12-21 (×2): qty 1

## 2019-12-21 MED ORDER — ACETAMINOPHEN 325 MG PO TABS
650.0000 mg | ORAL_TABLET | ORAL | Status: DC | PRN
  Administered 2019-12-21 – 2019-12-23 (×4): 650 mg via ORAL
  Filled 2019-12-21 (×4): qty 2

## 2019-12-21 MED ORDER — FLEET ENEMA 7-19 GM/118ML RE ENEM: 1.0000 | ENEMA | Freq: Every day | RECTAL | Status: DC | PRN

## 2019-12-21 MED ORDER — METFORMIN HCL ER 500 MG PO TB24
500.0000 mg | ORAL_TABLET | Freq: Every day | ORAL | Status: DC
  Filled 2019-12-21: qty 1

## 2019-12-21 MED ORDER — LIDOCAINE HCL (PF) 1 % IJ SOLN: 30.0000 mL | INTRAMUSCULAR | Status: DC | PRN

## 2019-12-21 MED ORDER — OXYTOCIN BOLUS FROM INFUSION
500.0000 mL | Freq: Once | INTRAVENOUS | Status: AC
  Administered 2019-12-21: 500 mL via INTRAVENOUS

## 2019-12-21 MED ORDER — SODIUM CHLORIDE 0.9 % IV SOLN
5.0000 10*6.[IU] | Freq: Once | INTRAVENOUS | Status: AC
  Administered 2019-12-21: 5 10*6.[IU] via INTRAVENOUS
  Filled 2019-12-21: qty 5

## 2019-12-21 MED ORDER — EPHEDRINE 5 MG/ML INJ: 10.0000 mg | INTRAVENOUS | Status: DC | PRN

## 2019-12-21 MED ORDER — FLUOXETINE HCL 20 MG PO CAPS
20.0000 mg | ORAL_CAPSULE | Freq: Every day | ORAL | Status: DC
  Administered 2019-12-21 – 2019-12-23 (×3): 20 mg via ORAL
  Filled 2019-12-21 (×3): qty 1

## 2019-12-21 MED ORDER — WITCH HAZEL-GLYCERIN EX PADS: 1.0000 "application " | MEDICATED_PAD | CUTANEOUS | Status: DC | PRN

## 2019-12-21 MED ORDER — OXYTOCIN 40 UNITS IN NORMAL SALINE INFUSION - SIMPLE MED: 2.5000 [IU]/h | INTRAVENOUS | Status: DC

## 2019-12-21 MED ORDER — TETANUS-DIPHTH-ACELL PERTUSSIS 5-2.5-18.5 LF-MCG/0.5 IM SUSP: 0.5000 mL | Freq: Once | INTRAMUSCULAR | Status: DC

## 2019-12-21 MED ORDER — OXYTOCIN 40 UNITS IN NORMAL SALINE INFUSION - SIMPLE MED: 1.0000 m[IU]/min | INTRAVENOUS | Status: DC

## 2019-12-21 MED ORDER — ACETAMINOPHEN 325 MG PO TABS: 650.0000 mg | ORAL_TABLET | ORAL | Status: DC | PRN

## 2019-12-21 MED ORDER — IBUPROFEN 600 MG PO TABS
600.0000 mg | ORAL_TABLET | Freq: Four times a day (QID) | ORAL | Status: DC
  Administered 2019-12-21 – 2019-12-23 (×7): 600 mg via ORAL
  Filled 2019-12-21 (×7): qty 1

## 2019-12-21 MED ORDER — PRENATAL MULTIVITAMIN CH
1.0000 | ORAL_TABLET | Freq: Every day | ORAL | Status: DC
  Administered 2019-12-22 – 2019-12-23 (×2): 1 via ORAL
  Filled 2019-12-21 (×2): qty 1

## 2019-12-21 MED ORDER — LABETALOL HCL 200 MG PO TABS
200.0000 mg | ORAL_TABLET | Freq: Two times a day (BID) | ORAL | Status: DC
  Administered 2019-12-21: 200 mg via ORAL
  Filled 2019-12-21: qty 1

## 2019-12-21 MED ORDER — SIMETHICONE 80 MG PO CHEW: 80.0000 mg | CHEWABLE_TABLET | ORAL | Status: DC | PRN

## 2019-12-21 MED ORDER — LACTATED RINGERS IV SOLN: INTRAVENOUS | Status: DC

## 2019-12-21 MED ORDER — BISACODYL 10 MG RE SUPP: 10.0000 mg | Freq: Every day | RECTAL | Status: DC | PRN

## 2019-12-21 MED ORDER — OXYTOCIN 40 UNITS IN NORMAL SALINE INFUSION - SIMPLE MED
INTRAVENOUS | Status: AC
  Administered 2019-12-21: 2 m[IU]/min via INTRAVENOUS
  Filled 2019-12-21: qty 1000

## 2019-12-21 MED ORDER — LACTATED RINGERS IV SOLN: 500.0000 mL | Freq: Once | INTRAVENOUS | Status: DC

## 2019-12-21 MED ORDER — IBUPROFEN 800 MG PO TABS
800.0000 mg | ORAL_TABLET | Freq: Once | ORAL | Status: AC
  Administered 2019-12-21: 800 mg via ORAL
  Filled 2019-12-21: qty 1

## 2019-12-21 MED ORDER — PENICILLIN G POT IN DEXTROSE 60000 UNIT/ML IV SOLN
3.0000 10*6.[IU] | INTRAVENOUS | Status: DC
  Administered 2019-12-21: 3 10*6.[IU] via INTRAVENOUS
  Filled 2019-12-21: qty 50

## 2019-12-21 MED ORDER — DIBUCAINE (PERIANAL) 1 % EX OINT: 1.0000 "application " | TOPICAL_OINTMENT | CUTANEOUS | Status: DC | PRN

## 2019-12-21 MED ORDER — TERBUTALINE SULFATE 1 MG/ML IJ SOLN: 0.2500 mg | Freq: Once | INTRAMUSCULAR | Status: DC | PRN

## 2019-12-21 MED ORDER — LACTATED RINGERS IV SOLN: 500.0000 mL | INTRAVENOUS | Status: DC | PRN

## 2019-12-21 MED ORDER — BENZOCAINE-MENTHOL 20-0.5 % EX AERO: 1.0000 "application " | INHALATION_SPRAY | CUTANEOUS | Status: DC | PRN

## 2019-12-21 MED ORDER — SENNOSIDES-DOCUSATE SODIUM 8.6-50 MG PO TABS
2.0000 | ORAL_TABLET | ORAL | Status: DC
  Administered 2019-12-21 – 2019-12-22 (×2): 2 via ORAL
  Filled 2019-12-21 (×2): qty 2

## 2019-12-21 NOTE — Lactation Note (Signed)
This note was copied from a baby's chart. Lactation Consultation Note  Patient Name: [name] [id number] Date: 12/21/2019  P4, 4 hour ETI female infant. Mom's feeding choice at admission was breast and formula feeding. Per mom, she made two attempts to breastfed infant but she is in pain and decided to formula fed infant only. LC mention if mom changes her mind again we are here for her.    Maternal Data    Feeding Feeding Type: Bottle Fed - Formula Nipple Type: Slow - flow  LATCH Score                   Interventions    Lactation Tools Discussed/Used     Consult Status      Vicente Serene 12/21/2019, 9:15 PM

## 2019-12-21 NOTE — Progress Notes (Signed)
Noted low pulse rate since delivery, on labetalol 200 mf BID. BP remains in mild range.  Will DC labetalol and change to Procardia 30 XL tonight, then daily.  Rpt PEC labs in am.  Continue close observation.

## 2019-12-21 NOTE — H&P (Signed)
OB ADMISSION/ HISTORY & PHYSICAL:  Admission Date: 12/21/2019  8:54 AM  Admit Diagnosis: preg    [name] is a 39 y.o. female presenting for scheduled IOL, CHTN on labetalol, T2DM poorly controlled, morbid obesity.  Presents for admit, anxious, hyperventilating, requesting "something for anxiety". N/V present throughout pregnancy, not taking her medications regularly because of it. Using Tums for HB with minimal relief, also ptyalism not responsive to Rubinol trial.  + FM, no LOF/VB, reports lost mucous plug yesterday, lots of cramping overnight.  Denies HA/NV/RUQ pain/visual changes.   Partner present and supportive.    Prenatal History: [id number]   EDC : 01/11/2020, by Last Menstrual Period  Prenatal care at [facility] since [redacted] wks EGA  Prenatal course complicated by: 1 - CHTN - started labetalol 200 mg BID at 17 wks, poor compliance w/ regimen d/t persistent N/V in pregnancy  - followed with MFM consult, ANFT and serial growths, SGA at 19 wks, AGA at 32 wks, normal BPP's  - PEC labs wnl 29 wks, no evidence superimposed PEC 2 - T2DM - poor control on metformin 500 mg BID, not taking regularly d/t N/V, last A1c 6.4 at start of PNC  - normal fetal echo 3 - N/V refractory to Zofran, phenergan, doxylamine/VB6 4 - BMI 42 at onset of PNC, 20 lbs TWG 5 - AMA 39 yo 6 - Hx HSV1,2, 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, 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  7 - Depression/anxiety on Prozac 20 mg  8 - SGA at 19 wks - MFM consult EFW 14  %, resolved at 32 wks EFW 86% 9 - Fibroid - posterior 9x8x5 10 - Abnormal genetic screening - low FF Panorama, normal MaternT21, AFP wnl   Prenatal Labs: ABO, Rh: A (09/28 0000)  Antibody: Negative (09/28 0000) Rubella: Immune (09/28 0000)  RPR: NON REACTIVE (02/25 1404)  HBsAg: Negative (09/28 0000)  HIV: NON REACTIVE (02/25 1404)  GBS: Positive/-- (04/16 0000)  DM 2 - poor glycemic control on metformin, declined insulin Genetic Screening: low FF Panorama, normal  MaternT21, AFP wnl Ultrasound: per MFM normal anatomy, posterior placenta    Maternal Diabetes: Yes:  Diabetes Type:  Pre-pregnancy Genetic Screening: Normal Maternal Ultrasounds/Referrals: Normal Fetal Ultrasounds or other Referrals:  Fetal echo Maternal Substance Abuse:  No Significant Maternal Medications:  Meds include: Prozac, metformin, labetalol, Zofran, promethazine, Tums, Reglan, Rubinol Significant Maternal Lab Results:  Group B Strep positive Other Comments:  BMI 48  Medical / Surgical History :  Past medical history:  Past Medical History:  Diagnosis Date  . Abnormal Pap smear   . Anemia   . Asthma   . Depression   . Fibroid   . GDM (gestational diabetes mellitus)   . H/O varicella   . HSV (herpes simplex virus) anogenital infection   . Hx of bacterial infection   . Hyperemesis    During first pregnancy  . Hypertension   . Sinusitis   . Trichomonas 02/2011   treated  . Type 2 diabetes mellitus complicating pregnancy, antepartum   . Urinary tract infection   . Vaginal Pap smear, abnormal      Past surgical history:  Past Surgical History:  Procedure Laterality Date  . BREAST SURGERY    . NO PAST SURGERIES       Family History:  Family History  Problem Relation Age of Onset  . Diabetes Mother   . Hypertension Mother   . Peripheral vascular disease Mother   . Thyroid disease Mother   .  Asthma Mother   . Diabetes Father   . Hypertension Father   . Seizures Maternal Aunt   . Alcohol abuse Maternal Aunt   . Heart attack Maternal Uncle   . Kidney disease Maternal Uncle   . Cancer Maternal Uncle        prostate  . Thyroid disease Paternal Aunt   . Heart attack Paternal Uncle   . Kidney disease Paternal Uncle   . Anesthesia problems Neg Hx   . Hypotension Neg Hx   . Malignant hyperthermia Neg Hx   . Pseudochol deficiency Neg Hx      Social History:  reports that she has never smoked. She has never used smokeless tobacco. She reports that she  does not drink alcohol or use drugs.   Allergies: Flagyl [metronidazole hcl], Latex, and Other   Current Medications at time of admission:  Per patient takes her Prozac regularly, not compliant with metformin and labetalol Occasional Tums, Zofran,  Phenergan, Robinul  Review of Systems: ROS As noted above Physical Exam: Vital signs and nursing notes reviewed.  ED Triage Vitals  Enc Vitals Group     BP 12/21/19 0931 (!) 143/105     Pulse Rate 12/21/19 0931 100     Resp 12/21/19 0931 18     Temp 12/21/19 0925 98.7 F (37.1 C)     Temp Source 12/21/19 0925 Oral     SpO2 --      Weight 12/21/19 0920 277 lb 14.4 oz (126.1 kg)     Height 12/21/19 0920 5\' 3"  (1.6 m)     Head Circumference --      Peak Flow --      Pain Score 12/21/19 0921 0     Pain Loc --      Pain Edu? --      Excl. in [place]? --      General: AAO x 3, NAD, anxious Heart: RRR Lungs:CTAB Abdomen: Gravid, NT, obese Extremities: trace edema Genitalia / VE: Dilation: 4 Effacement (%): 70 Cervical Position: Middle Station: -1 Presentation: Vertex Exam by:: Melina Copa, CNM  IBOW, membrane sweep done  FHR: 150 BPM, moderate variability, + accels, no decels TOCO: Ctx q 3 min, mild  Labs:   Pending T&S, CBC, RPR, CMP, PCR CBG (last 3)  Recent Labs    12/21/19 0959  GLUCAP 134*     Assessment:  39 y.o. G4P3003 at [redacted]w[redacted]d for IOL 2/2 CHTN, DM2 w/ poor glycemic control Admit to BS, routine orders  CHTN - no evidence superimposed preeclampsia, PEC labs pending, BP mild range  - monitor closely for worsening BP's/PEC developing  - continue labetalol 200 mg BID DM2  - resume metformin, will give 1000 mg XR daily  - CBG q 2 hrs in labor, start endo tool SSI for CBG > 120 x 2 N/V   - Pepcid 20 mg IV daily Anxiety  - Vistaril 50 mg q 6 hr PRN  - continue Prozac 20 mg daily  - nitrous gas for labor pain, epidural as needed IOL  - favorable cvx, start Pitocin titrate per protocol, AROM with 2nd dose ABX  -  nitrous gas for pain control early in labor, epidural PRN GBS positive  - PCN per protocol FWB  - category 1 tracing, reassuring  - continuous EFM HSV 1,2  - no evidence of lesions Undesired fertility  - PP BTL, patient counseled in office  - maintain epidural cath in place if available  Dr Mancel Bale notified of  admission / plan of care   [place], MSN 12/21/2019, 10:24 AM

## 2019-12-21 NOTE — Progress Notes (Signed)
Patient ID: [name], female   DOB: [birth year], 39 y.o.   MRN: [id number] [name] is a 39 y.o. G4P3003 at [redacted]w[redacted]d by LMP admitted for IOL at 37 wks for [facility] and DM 2 with poor control.  Subjective:  Feeling ctx more intense but still able to cope well. Anxiety returning, worried about birth, does not like epidural / loss of movement and numb sensation in feet, but also worried about pain w/ delivery.  Denies HA/NV/RUQ pain/visual changes.    Objective: Vitals:   12/21/19 1239 12/21/19 1345 12/21/19 1433 12/21/19 1435  BP: 121/68 (!) 143/103  (!) 145/100  Pulse: 77 78  83  Resp: 18 16    Temp:   98.5 F (36.9 C)   TempSrc:   Oral   Weight:      Height:        FHT:  FHR: 140 bpm, variability: moderate,  accelerations:  Present,  decelerations:  Absent UC:   regular, every 2-3 minutes SVE:   Dilation: 5 Effacement (%): 90 Station: -1 Exam by:: Melina Copa, CNM Arom, clear AF, vertex FSE applied, external monitoring difficult d/t body habitus  Labs:   Recent Labs    12/21/19 1132  WBC 4.2  HGB 12.7  HCT 36.5  PLT 242   CMP     Component Value Date/Time   NA 135 12/21/2019 1132   K 4.0 12/21/2019 1132   CL 105 12/21/2019 1132   CO2 20 (L) 12/21/2019 1132   GLUCOSE 128 (H) 12/21/2019 1132   BUN 9 12/21/2019 1132   CREATININE 0.72 12/21/2019 1132   CALCIUM 9.4 12/21/2019 1132   PROT 6.5 12/21/2019 1132   ALBUMIN 3.2 (L) 12/21/2019 1132   AST 23 12/21/2019 1132   ALT 21 12/21/2019 1132   ALKPHOS 103 12/21/2019 1132   BILITOT 0.5 12/21/2019 1132   GFRNONAA >60 12/21/2019 1132   GFRAA >60 12/21/2019 1132   PCR 0.38  CBG (last 3)  Recent Labs    12/21/19 0959 12/21/19 1203 12/21/19 1313  GLUCAP 134* 131* 96    Assessment / Plan: Induction of labor due to [facility],  progressing well on pitocin  Labor: AROM for augmentation, anticipate accelerated labor curve Preeclampsia:  PCR c/w PEC, all other labs wnl, now added superimposed PEC w/o severe features On  labetalol 200 mg BID restarted today Continue monitoring closely for worsening BP/PEC neural sx DMT2: started metformin late this am, first 2 CBG > 120, noted med effect shortly after. Continue CBG q 2 hrs, SSI per endo tool if elevated again Fetal Wellbeing:  Category I Pain Control:  Nitrous Oxide trial for now, epidural PRN I/D:  GBS prophylaxis, 2nd PCN dose given now Anticipated MOD:  NSVD  Juliene Pina, CNM, MSN 12/21/2019, 2:55 PM

## 2019-12-22 ENCOUNTER — Encounter (HOSPITAL_COMMUNITY)
Admission: AD | Disposition: A | Discharge status: Home / Self Care | Payer: Self-pay | Source: Home / Self Care | Attending: Obstetrics & Gynecology

## 2019-12-22 LAB — COMPREHENSIVE METABOLIC PANEL
ALT: 21 U/L (ref 0–44)
AST: 32 U/L (ref 15–41)
Albumin: 2.7 g/dL — ABNORMAL LOW (ref 3.5–5.0)
Alkaline Phosphatase: 82 U/L (ref 38–126)
Anion gap: 10 (ref 5–15)
BUN: 12 mg/dL (ref 6–20)
CO2: 22 mmol/L (ref 22–32)
Calcium: 9 mg/dL (ref 8.9–10.3)
Chloride: 105 mmol/L (ref 98–111)
Creatinine, Ser: 0.65 mg/dL (ref 0.44–1.00)
GFR calc Af Amer: >60 mL/min (ref >60)
GFR calc non Af Amer: >60 mL/min (ref >60)
Glucose, Bld: 104 mg/dL — ABNORMAL HIGH (ref 70–99)
Potassium: 4 mmol/L (ref 3.5–5.1)
Sodium: 137 mmol/L (ref 135–145)
Total Bilirubin: 0.5 mg/dL (ref 0.3–1.2)
Total Protein: 5.5 g/dL — ABNORMAL LOW (ref 6.5–8.1)

## 2019-12-22 LAB — CBC
HCT: 33.3 % — ABNORMAL LOW (ref 36.0–46.0)
Hemoglobin: 11.2 g/dL — ABNORMAL LOW (ref 12.0–15.0)
MCH: 29.9 pg (ref 26.0–34.0)
MCHC: 33.6 g/dL (ref 30.0–36.0)
MCV: 88.8 fL (ref 80.0–100.0)
Platelets: 203 10*3/uL (ref 150–400)
RBC: 3.75 MIL/uL — ABNORMAL LOW (ref 3.87–5.11)
RDW: 12.4 % (ref 11.5–15.5)
WBC: 7.6 10*3/uL (ref 4.0–10.5)
nRBC: 0 % (ref 0.0–0.2)

## 2019-12-22 LAB — GLUCOSE, CAPILLARY: Glucose-Capillary: 102 mg/dL — ABNORMAL HIGH (ref 70–99)

## 2019-12-22 LAB — RPR: RPR Ser Ql: NONREACTIVE

## 2019-12-22 SURGERY — LIGATION, FALLOPIAN TUBE, POSTPARTUM
Anesthesia: Choice | Laterality: Bilateral

## 2019-12-22 MED ORDER — METOCLOPRAMIDE HCL 10 MG PO TABS: 10.0000 mg | ORAL_TABLET | Freq: Once | ORAL | Status: DC

## 2019-12-22 MED ORDER — NIFEDIPINE ER OSMOTIC RELEASE 30 MG PO TB24
60.0000 mg | ORAL_TABLET | Freq: Every day | ORAL | Status: DC
  Administered 2019-12-23: 60 mg via ORAL
  Filled 2019-12-22: qty 2

## 2019-12-22 MED ORDER — FAMOTIDINE 20 MG PO TABS: 40.0000 mg | ORAL_TABLET | Freq: Once | ORAL | Status: DC

## 2019-12-22 MED ORDER — LACTATED RINGERS IV SOLN: INTRAVENOUS | Status: DC

## 2019-12-22 NOTE — Progress Notes (Signed)
MOB was referred for history of depression/anxiety. * Referral screened out by Clinical Social Worker because none of the following criteria appear to apply: ~ History of anxiety/depression during this pregnancy, or of post-partum depression following prior delivery. ~ Diagnosis of anxiety and/or depression within last 3 years OR * MOB's symptoms currently being treated with medication and/or therapy. Per chart review, MOB is currently prescribed/taking Prozac.  Please contact the Clinical Social Worker if needs arise, by [facility] request, or if MOB scores greater than 9/yes to question 10 on Edinburgh Postpartum Depression Screen.  Abundio Miu, [place] Worker [facility] Cell#: [phone]

## 2019-12-22 NOTE — Progress Notes (Signed)
Subjective: Postpartum Day # 1 : S/P NSVD due to IOL for chronic hypertension and T2DM, poorly controlled. Patient up ad lib, denies syncope or dizziness. Reports consuming regular diet without issues and denies N/V. Patient reports no bowel movement but is passing flatus.  Denies issues with urination and reports bleeding is "normal."  Patient is bottle feeding and reports going well.  Desires BTL for postpartum contraception.  Pain is being appropriately managed with use of Motrin.   No laceration Feeding:  Formula Contraceptive plan:  BTL  Objective: Vital signs in last 24 hours: Patient Vitals for the past 24 hrs:  BP Temp Temp src Pulse Resp SpO2  12/22/19 0712 (!) 150/94 -- -- 85 18 --  12/22/19 0330 137/72 98.1 F (36.7 C) Oral 88 18 --  12/21/19 2335 (!) 146/93 98 F (36.7 C) Oral 72 18 --  12/21/19 1930 (!) 149/98 98.1 F (36.7 C) Oral 66 18 --  12/21/19 1832 (!) 155/89 98.4 F (36.9 C) Oral (!) 51 20 99 %  12/21/19 1745 (!) 148/90 -- -- 60 -- --  12/21/19 1741 137/69 -- -- 74 -- --  12/21/19 1715 133/64 -- -- 90 -- --  12/21/19 1701 (!) 141/80 -- -- 71 -- --  12/21/19 1646 134/72 -- -- 79 18 --  12/21/19 1536 132/76 -- -- 84 20 --  12/21/19 1435 (!) 145/100 -- -- 83 -- --  12/21/19 1433 -- 98.5 F (36.9 C) Oral -- -- --  12/21/19 1345 (!) 143/103 -- -- 78 16 --  12/21/19 1239 121/68 -- -- 77 18 --  12/21/19 1201 (!) 144/100 -- -- 85 17 --    Physical Exam:  General: alert and cooperative Mood/Affect: appropriate Lungs: clear to auscultation, no wheezes, rales or rhonchi, symmetric air entry.  Heart: normal rate, regular rhythm, normal S1, S2, no murmurs, rubs, clicks or gallops. Breast: breasts appear normal, no suspicious masses, no skin or nipple changes or axillary nodes. Abdomen:  + bowel sounds, soft, non-tender GU: perineum intact, healing well. No signs of external hematomas.  Uterine Fundus: firm Lochia: appropriate Skin: Warm, Dry. DVT Evaluation: No  evidence of DVT seen on physical exam.  CBC Latest Ref Rng & Units 12/22/2019 12/21/2019 10/28/2019  WBC 4.0 - 10.5 K/uL 7.6 4.2 6.2  Hemoglobin 12.0 - 15.0 g/dL 11.2(L) 12.7 12.0  Hematocrit 36.0 - 46.0 % 33.3(L) 36.5 35.5(L)  Platelets 150 - 400 K/uL 203 242 261    Results for orders placed or performed during the hospital encounter of 12/21/19 (from the past 24 hour(s))  CBC     Status: None   Collection Time: 12/21/19 11:32 AM  Result Value Ref Range   WBC 4.2 4.0 - 10.5 K/uL   RBC 4.23 3.87 - 5.11 MIL/uL   Hemoglobin 12.7 12.0 - 15.0 g/dL   HCT 36.5 36.0 - 46.0 %   MCV 86.3 80.0 - 100.0 fL   MCH 30.0 26.0 - 34.0 pg   MCHC 34.8 30.0 - 36.0 g/dL   RDW 12.3 11.5 - 15.5 %   Platelets 242 150 - 400 K/uL   nRBC 0.0 0.0 - 0.2 %  Comprehensive metabolic panel     Status: Abnormal   Collection Time: 12/21/19 11:32 AM  Result Value Ref Range   Sodium 135 135 - 145 mmol/L   Potassium 4.0 3.5 - 5.1 mmol/L   Chloride 105 98 - 111 mmol/L   CO2 20 (L) 22 - 32 mmol/L   Glucose, Bld 128 (  H) 70 - 99 mg/dL   BUN 9 6 - 20 mg/dL   Creatinine, Ser 0.72 0.44 - 1.00 mg/dL   Calcium 9.4 8.9 - 10.3 mg/dL   Total Protein 6.5 6.5 - 8.1 g/dL   Albumin 3.2 (L) 3.5 - 5.0 g/dL   AST 23 15 - 41 U/L   ALT 21 0 - 44 U/L   Alkaline Phosphatase 103 38 - 126 U/L   Total Bilirubin 0.5 0.3 - 1.2 mg/dL   GFR calc non Af Amer >60 >60 mL/min   GFR calc Af Amer >60 >60 mL/min   Anion gap 10 5 - 15  Type and screen [place]     Status: None   Collection Time: 12/21/19 11:33 AM  Result Value Ref Range   ABO/RH(D) A POS    Antibody Screen NEG    Sample Expiration      12/24/2019,2359 Performed at [facility], [place] [address], [place], [place] [address]   ABO/Rh     Status: None   Collection Time: 12/21/19 11:33 AM  Result Value Ref Range   ABO/RH(D)      A POS Performed at [facility] [address], [place], [place] [address]   Glucose, capillary     Status: Abnormal    Collection Time: 12/21/19 12:03 PM  Result Value Ref Range   Glucose-Capillary 131 (H) 70 - 99 mg/dL  Glucose, capillary     Status: None   Collection Time: 12/21/19  1:13 PM  Result Value Ref Range   Glucose-Capillary 96 70 - 99 mg/dL  Glucose, capillary     Status: None   Collection Time: 12/21/19  3:01 PM  Result Value Ref Range   Glucose-Capillary 86 70 - 99 mg/dL  CBC     Status: Abnormal   Collection Time: 12/22/19  5:55 AM  Result Value Ref Range   WBC 7.6 4.0 - 10.5 K/uL   RBC 3.75 (L) 3.87 - 5.11 MIL/uL   Hemoglobin 11.2 (L) 12.0 - 15.0 g/dL   HCT 33.3 (L) 36.0 - 46.0 %   MCV 88.8 80.0 - 100.0 fL   MCH 29.9 26.0 - 34.0 pg   MCHC 33.6 30.0 - 36.0 g/dL   RDW 12.4 11.5 - 15.5 %   Platelets 203 150 - 400 K/uL   nRBC 0.0 0.0 - 0.2 %  Comprehensive metabolic panel     Status: Abnormal   Collection Time: 12/22/19  5:55 AM  Result Value Ref Range   Sodium 137 135 - 145 mmol/L   Potassium 4.0 3.5 - 5.1 mmol/L   Chloride 105 98 - 111 mmol/L   CO2 22 22 - 32 mmol/L   Glucose, Bld 104 (H) 70 - 99 mg/dL   BUN 12 6 - 20 mg/dL   Creatinine, Ser 0.65 0.44 - 1.00 mg/dL   Calcium 9.0 8.9 - 10.3 mg/dL   Total Protein 5.5 (L) 6.5 - 8.1 g/dL   Albumin 2.7 (L) 3.5 - 5.0 g/dL   AST 32 15 - 41 U/L   ALT 21 0 - 44 U/L   Alkaline Phosphatase 82 38 - 126 U/L   Total Bilirubin 0.5 0.3 - 1.2 mg/dL   GFR calc non Af Amer >60 >60 mL/min   GFR calc Af Amer >60 >60 mL/min   Anion gap 10 5 - 15  Glucose, capillary     Status: Abnormal   Collection Time: 12/22/19  6:37 AM  Result Value  Ref Range   Glucose-Capillary 102 (H) 70 - 99 mg/dL     CBG (last 3)  Recent Labs    12/21/19 1313 12/21/19 1501 12/22/19 0637  GLUCAP 96 86 102*     I/O last 3 completed shifts: In: 132.1 [I.V.:132.1] Out: 200 [Blood:200]   Assessment Postpartum Day # 1 : S/P NSVD due to IOL for chronic hypertension. Pt stable. U-1 involution. Bottle feeding. Hemodynamically stable. BPs continue to be  elevated. Denies headache, epigastric pain, or visual changes. AST increased from 23 to 32. CBGs stable.   Plan: 1. Due to OR schedule BTL was canceled and will be done at 6 weeks PP. Will give Depo Provera at discharge.  2. May resume diabetic diet.  3. Increase Procardia to 60mg  daily for elevated BPs. Will recheck CMP in the AM. 4. Continue Metformin 500mg  5. VTE prophylactics: Early ambulated as tolerates.  6. Pain control: Motrin/Tylenol PRN  Dr. Alwyn Pea to be updated on patient status  Zettie Pho, MSN 12/22/2019, 11:13 AM

## 2019-12-22 NOTE — Progress Notes (Addendum)
Inpatient Diabetes Program Recommendations  AACE/ADA: New Consensus Statement on Inpatient Glycemic Control  Target Ranges:  Prepandial:   less than 140 mg/dL      Peak postprandial:   less than 180 mg/dL (1-2 hours)      Critically ill patients:  140 - 180 mg/dL   Results for [name], [name] (MRN [id number]) as of 12/22/2019 08:56  Ref. Range 12/21/2019 09:59 12/21/2019 12:03 12/21/2019 13:13 12/21/2019 15:01 12/22/2019 06:37  Glucose-Capillary Latest Ref Range: 70 - 99 mg/dL 134 (H) 131 (H) 96 86 102 (H)   Review of Glycemic Control  Diabetes history: DM2 Outpatient Diabetes medications: Metformin 500 mg BID Current orders for Inpatient glycemic control: Metformin XR 500 mg QAM  Inpatient Diabetes Program Recommendations:    Insulin-Correction: While inpatient please consider ordering CBGs AC&HS with Novolog 0-9 units TID with meals.  NOTE: Chart reviewed.  Patient with DM2 hx and takes Metformin 500 mg BID as an outpatient. Patient delivered on 12/21/19 and fasting glucose 102 mg/dl this morning. Currently ordered Metformin XR 500 mg QAM. Recommend adding Novolog correction scale TID with meals while inpatient.   Thanks, Barnie Alderman, RN, MSN, CDE Diabetes Coordinator Inpatient Diabetes Program [phone] (Team Pager from 8am to 5pm)

## 2019-12-23 LAB — GLUCOSE, CAPILLARY: Glucose-Capillary: 88 mg/dL (ref 70–99)

## 2019-12-23 MED ORDER — METFORMIN HCL ER 500 MG PO TB24: 500.0000 mg | ORAL_TABLET | Freq: Every day | ORAL | 2 refills | Status: DC

## 2019-12-23 MED ORDER — NIFEDIPINE ER 60 MG PO TB24: 60.0000 mg | ORAL_TABLET | Freq: Every day | ORAL | 2 refills | Status: DC

## 2019-12-23 MED ORDER — IBUPROFEN 600 MG PO TABS: 600.0000 mg | ORAL_TABLET | Freq: Four times a day (QID) | ORAL | 0 refills | Status: DC

## 2019-12-23 NOTE — Discharge Summary (Signed)
OB Discharge Summary   Patient Name: [name] DOB: Apr 09, 1981 MRN: [id number]  Date of admission: 12/21/2019 Delivering MD: Juliene Pina  Date of delivery: 12/21/19 Type of delivery: NSVD  Newborn Data: Sex: Baby girl Name: ??? Live born female  Birth Weight: 7 lb 6.7 oz (3365 g) APGAR: 1, 9  Newborn Delivery   Birth date/time: 12/21/2019 16:38:00 Delivery type: Vaginal, Spontaneous     Feeding: bottle Infant being discharge to home with mother in stable condition.   Admitting diagnosis: Encounter for planned induction of labor [Z34.90] Intrauterine pregnancy: [redacted]w[redacted]d     Secondary diagnosis:  Principal Problem:   Postpartum care following vaginal delivery 4/20 Active Problems:   DM (diabetes mellitus) in pregnancy   Depression / anxiety   SVD (spontaneous vaginal delivery)   Encounter for planned induction of labor   Chronic hypertension affecting pregnancy   BMI 45.0-49.9, adult ([place])                                Complications: None                                                              Intrapartum Procedures: spontaneous vaginal delivery Postpartum Procedures: none Complications-Operative and Postpartum: none Induction: AROM and Pitocin  History of Present Illness: Ms. [name] is a 39 y.o. female, [id number], who presents at [redacted]w[redacted]d weeks gestation. The patient has been followed at  [facility] and [facility]necology  Her pregnancy has been complicated by:  Patient Active Problem List   Diagnosis Date Noted   Encounter for planned induction of labor 12/21/2019   Chronic hypertension affecting pregnancy 12/21/2019   BMI 45.0-49.9, adult ([place]) 12/21/2019   Postpartum care following vaginal delivery 4/20 12/21/2019   SVD (spontaneous vaginal delivery) 04/23/2014   Depression / anxiety 02/20/2014   DM (diabetes mellitus) in pregnancy 11/29/2013   Latex allergy 11/29/2013   Allergy or intolerance to drug--flagyl 11/29/2013   Hospital course:   Onset of Labor With Vaginal Delivery     39 y.o. yo [id number] at [redacted]w[redacted]d was admitted in Latent Labor on 12/21/2019. Patient had an uncomplicated labor course as follows:  Membrane Rupture Time/Date: 2:43 PM ,12/21/2019   Intrapartum Procedures: Episiotomy: None [1]                                         Lacerations:  None [1]  Patient had a delivery of a Viable infant. 12/21/2019  Information for the patient's newborn:  Iqra, Suhr [id number]  Delivery Method: Vaginal, Spontaneous(Filed from Delivery Summary)    Patient had an uncomplicated postpartum course.  She is ambulating, tolerating a regular diet, passing flatus, and urinating well. CBGs are stable with Metformin 500mg . BP is normotensive with Procardia 60mg . Patient is discharged home in stable condition on 12/23/19. Will have [place] office make a referral and appointment for endocrinology, first available for follow-up of T2DM and foot ulcers.   Postpartum Day # 2 : S/P NSVD due to cHTN and poorly controlled T2DM. Patient up ad lib, denies syncope or dizziness. Reports consuming regular diet without issues and denies N/V.  Patient reports no bowel movement and is passing flatus.  Denies issues with urination and reports bleeding is "good."  Patient is bottle feeding and reports going well.  Desires BTL for postpartum contraception.  Pain is being appropriately managed with use of Motrin.   Physical exam  Vitals:   12/22/19 1437 12/22/19 1933 12/22/19 2126 12/23/19 0536  BP: (!) 141/91 (!) 145/83 (!) 140/99 127/73  Pulse: 97 76 91 89  Resp: 18 17 16 17   Temp: 98 F (36.7 C) 98.7 F (37.1 C) 98 F (36.7 C) 98.5 F (36.9 C)  TempSrc: Oral Oral Oral Oral  SpO2:  100% 100% 100%  Weight:      Height:       General: alert and cooperative Lochia: appropriate Uterine Fundus: firm Perineum: intact DVT Evaluation: No evidence of DVT seen on physical exam.  Labs: Lab Results  Component Value Date   WBC 7.6 12/22/2019   HGB 11.2  (L) 12/22/2019   HCT 33.3 (L) 12/22/2019   MCV 88.8 12/22/2019   PLT 203 12/22/2019   CMP Latest Ref Rng & Units 12/22/2019  Glucose 70 - 99 mg/dL 104(H)  BUN 6 - 20 mg/dL 12  Creatinine 0.44 - 1.00 mg/dL 0.65  Sodium 135 - 145 mmol/L 137  Potassium 3.5 - 5.1 mmol/L 4.0  Chloride 98 - 111 mmol/L 105  CO2 22 - 32 mmol/L 22  Calcium 8.9 - 10.3 mg/dL 9.0  Total Protein 6.5 - 8.1 g/dL 5.5(L)  Total Bilirubin 0.3 - 1.2 mg/dL 0.5  Alkaline Phos 38 - 126 U/L 82  AST 15 - 41 U/L 32  ALT 0 - 44 U/L 21   Date of discharge: 12/23/2019 Discharge Diagnoses: Term Pregnancy-delivered Discharge instruction: per After Visit Summary and "Baby and Me Booklet".  Activity:           pelvic rest Advance as tolerated. Pelvic rest for 6 weeks.  Diet:                routine Medications: Ibuprofen Postpartum contraception: Tubal Ligation Condition:  Pt discharge to home with baby in stable condition.  Meds:   Discharge Follow Up:  Follow-up [facility]. Schedule an appointment as soon as possible for a visit in 1 week(s).   Specialty: Obstetrics and Gynecology Why: For blood pressure check Contact information: [place]. [address] [place] [phone]           12/23/2019, 8:55 AM  Suzan Nailer, CNM  MD Addendum: Patient was admitted for induction of labor at [redacted] weeks EGA for poorly controlled DM2 and CHTN. She is to follow up in office for 1 week BP check and referral to endocrinology for diabetes management.  On post partum day 2 she reported a headache that improved with tylenol usage. Her blood pressures remained less than 150/100 while on procardia XL.  She did not want to stay in the hospital any longer.  We discussed importance of her taking her procardia daily.  We discussed she may use tylenol and ibuprofen for headache pain and discussed pain precautions.  We also reviewed preeclampsia precautions  and she expressed understanding.   Dr. Alesia Richards.  12/23/2019.

## 2019-12-23 NOTE — Progress Notes (Signed)
Went to pt room to give noon medications and reassess BP, pt asked if I could call the MD and see if she could go home at this time.  Stated that her headache was "somewhat better", still rating headache 5 out of 10, and BP = 149 89.  Spoke to Dr Alesia Richards about request, stated she was fine with patient being discharged and transferred MD call to room to speak to the patient.

## 2019-12-29 ENCOUNTER — Other Ambulatory Visit: Payer: Self-pay

## 2019-12-31 ENCOUNTER — Ambulatory Visit: Payer: 59 | Admitting: Endocrinology

## 2020-01-24 ENCOUNTER — Other Ambulatory Visit: Payer: Self-pay

## 2020-01-24 ENCOUNTER — Encounter: Payer: Self-pay | Admitting: Endocrinology

## 2020-01-24 ENCOUNTER — Ambulatory Visit (INDEPENDENT_AMBULATORY_CARE_PROVIDER_SITE_OTHER): Payer: 59 | Admitting: Endocrinology

## 2020-01-24 VITALS — BP 144/88 | HR 78 | Ht 63.0 in | Wt 267.0 lb

## 2020-01-24 DIAGNOSIS — O24419 Gestational diabetes mellitus in pregnancy, unspecified control: Secondary | ICD-10-CM

## 2020-01-24 DIAGNOSIS — E119 Type 2 diabetes mellitus without complications: Secondary | ICD-10-CM

## 2020-01-24 LAB — TSH: TSH: 3.65 u[IU]/mL (ref 0.35–4.50)

## 2020-01-24 LAB — POCT GLYCOSYLATED HEMOGLOBIN (HGB A1C): Hemoglobin A1C: 6.3 % — AB (ref 4.0–5.6)

## 2020-01-24 MED ORDER — RYBELSUS 3 MG PO TABS: 3.0000 mg | ORAL_TABLET | Freq: Every day | ORAL | 11 refills | Status: DC

## 2020-01-24 NOTE — Patient Instructions (Addendum)
Your blood pressure is high today.  Please see your primary care provider soon, to have it rechecked.  good diet and exercise significantly improve the control of your diabetes.  please let me know if you wish to be referred to a dietician.  high blood sugar is very risky to your health.  you should see an eye doctor and dentist every year.  It is very important to get all recommended vaccinations.  Controlling your blood pressure and cholesterol drastically reduces the damage diabetes does to your body.  Those who smoke should quit.  Please discuss these with your doctor.  check your blood sugar once a day.  vary the time of day when you check, between before the 3 meals, and at bedtime.  also check if you have symptoms of your blood sugar being too high or too low.  please keep a record of the readings and bring it to your next appointment here (or you can bring the meter itself).  You can write it on any piece of paper.  please call us sooner if your blood sugar goes below 70, or if you have a lot of readings over 200.  Please continue the same metformin, and: I have sent a prescription to your pharmacy, to add "Rybelsus."   Blood tests are requested for you today.  We'll let you know about the results.  Please come back for a follow-up appointment in 2-3 months.     Bariatric Surgery You have so much to gain by losing weight.  You may have already tried every diet and exercise plan imaginable.  And, you may have sought advice from your family physician, too.   Sometimes, in spite of such diligent efforts, you may not be able to achieve long-term results by yourself.  In cases of severe obesity, bariatric or weight loss surgery is a proven method of achieving long-term weight control.  Our Services Our bariatric surgery programs offer our patients new hope and long-term weight-loss solution.  Since introducing our services in 2003, we have conducted more than 2,400 successful procedures.  Our  program is designated as a Programmer, multimedia by the Metabolic and Bariatric Surgery Accreditation and Quality Improvement Program (MBSAQIP), a IT trainer that sets rigorous patient safety and outcome standards.  Our program is also designated as a Ecologist by [employer].   Our exceptional weight-loss surgery team specializes in diagnosis, treatment, follow-up care, and ongoing support for our patients with severe weight loss challenges.  We currently offer laparoscopic sleeve gastrectomy, gastric bypass, and adjustable gastric band (LAP-BAND).    Attend our [employer] Choosing to undergo a bariatric procedure is a big decision, and one that should not be taken lightly.  You now have two options in how you learn about weight-loss surgery - in person or online.  Our objective is to ensure you have all of the information that you need to evaluate the advantages and obligations of this life changing procedure.  Please note that you are not alone in this process, and our experienced team is ready to assist and answer all of your questions.  There are several ways to register for a seminar (either on-line or in person): 1)  Call [phone] 2) Go on-line to [facility] [address] and register for either type of seminar.  [email or web address]       Diabetes Mellitus and Nutrition, Adult When you have diabetes (diabetes mellitus), it is very important to have healthy eating habits because your  blood sugar (glucose) levels are greatly affected by what you eat and drink. Eating healthy foods in the appropriate amounts, at about the same times every day, can help you:  Control your blood glucose.  Lower your risk of heart disease.  Improve your blood pressure.  Reach or maintain a healthy weight. Every person with diabetes is different, and each person has different needs for a meal plan. Your health care provider may recommend that  you work with a diet and nutrition specialist (dietitian) to make a meal plan that is best for you. Your meal plan may vary depending on factors such as:  The calories you need.  The medicines you take.  Your weight.  Your blood glucose, blood pressure, and cholesterol levels.  Your activity level.  Other health conditions you have, such as heart or kidney disease. How do carbohydrates affect me? Carbohydrates, also called carbs, affect your blood glucose level more than any other type of food. Eating carbs naturally raises the amount of glucose in your blood. Carb counting is a method for keeping track of how many carbs you eat. Counting carbs is important to keep your blood glucose at a healthy level, especially if you use insulin or take certain oral diabetes medicines. It is important to know how many carbs you can safely have in each meal. This is different for every person. Your dietitian can help you calculate how many carbs you should have at each meal and for each snack. Foods that contain carbs include:  Bread, cereal, rice, pasta, and crackers.  Potatoes and corn.  Peas, beans, and lentils.  Milk and yogurt.  Fruit and juice.  Desserts, such as cakes, cookies, ice cream, and candy. How does alcohol affect me? Alcohol can cause a sudden decrease in blood glucose (hypoglycemia), especially if you use insulin or take certain oral diabetes medicines. Hypoglycemia can be a life-threatening condition. Symptoms of hypoglycemia (sleepiness, dizziness, and confusion) are similar to symptoms of having too much alcohol. If your health care provider says that alcohol is safe for you, follow these guidelines:  Limit alcohol intake to no more than 1 drink per day for nonpregnant women and 2 drinks per day for men. One drink equals 12 oz of beer, 5 oz of wine, or 1 oz of hard liquor.  Do not drink on an empty stomach.  Keep yourself hydrated with water, diet soda, or unsweetened  iced tea.  Keep in mind that regular soda, juice, and other mixers may contain a lot of sugar and must be counted as carbs. What are tips for following this plan?  Reading food labels  Start by checking the serving size on the "Nutrition Facts" label of packaged foods and drinks. The amount of calories, carbs, fats, and other nutrients listed on the label is based on one serving of the item. Many items contain more than one serving per package.  Check the total grams (g) of carbs in one serving. You can calculate the number of servings of carbs in one serving by dividing the total carbs by 15. For example, if a food has 30 g of total carbs, it would be equal to 2 servings of carbs.  Check the number of grams (g) of saturated and trans fats in one serving. Choose foods that have low or no amount of these fats.  Check the number of milligrams (mg) of salt (sodium) in one serving. Most people should limit total sodium intake to less than 2,300 mg per  day.  Always check the nutrition information of foods labeled as "low-fat" or "nonfat". These foods may be higher in added sugar or refined carbs and should be avoided.  Talk to your dietitian to identify your daily goals for nutrients listed on the label. Shopping  Avoid buying canned, premade, or processed foods. These foods tend to be high in fat, sodium, and added sugar.  Shop around the outside edge of the grocery store. This includes fresh fruits and vegetables, bulk grains, fresh meats, and fresh dairy. Cooking  Use low-heat cooking methods, such as baking, instead of high-heat cooking methods like deep frying.  Cook using healthy oils, such as olive, canola, or sunflower oil.  Avoid cooking with butter, cream, or high-fat meats. Meal planning  Eat meals and snacks regularly, preferably at the same times every day. Avoid going long periods of time without eating.  Eat foods high in fiber, such as fresh fruits, vegetables, beans, and  whole grains. Talk to your dietitian about how many servings of carbs you can eat at each meal.  Eat 4-6 ounces (oz) of lean protein each day, such as lean meat, chicken, fish, eggs, or tofu. One oz of lean protein is equal to: ? 1 oz of meat, chicken, or fish. ? 1 egg. ?  cup of tofu.  Eat some foods each day that contain healthy fats, such as avocado, nuts, seeds, and fish. Lifestyle  Check your blood glucose regularly.  Exercise regularly as told by your health care provider. This may include: ? 150 minutes of moderate-intensity or vigorous-intensity exercise each week. This could be brisk walking, biking, or water aerobics. ? Stretching and doing strength exercises, such as yoga or weightlifting, at least 2 times a week.  Take medicines as told by your health care provider.  Do not use any products that contain nicotine or tobacco, such as cigarettes and e-cigarettes. If you need help quitting, ask your health care provider.  Work with a Social worker or diabetes educator to identify strategies to manage stress and any emotional and social challenges. Questions to ask a health care provider  Do I need to meet with a diabetes educator?  Do I need to meet with a dietitian?  What number can I call if I have questions?  When are the best times to check my blood glucose? Where to find more information:  American Diabetes Association: diabetes.org  Academy of Nutrition and Dietetics: www.eatright.[employer] of Diabetes and Digestive and Kidney Diseases (NIH): [employer] Summary  A healthy meal plan will help you control your blood glucose and maintain a healthy lifestyle.  Working with a diet and nutrition specialist (dietitian) can help you make a meal plan that is best for you.  Keep in mind that carbohydrates (carbs) and alcohol have immediate effects on your blood glucose levels. It is important to count carbs and to use alcohol carefully. This  information is not intended to replace advice given to you by your health care provider. Make sure you discuss any questions you have with your health care provider. Document Revised: 08/01/2017 Document Reviewed: 09/23/2016 Elsevier Patient Education  2020 [employer].

## 2020-01-24 NOTE — Progress Notes (Signed)
Subjective:    Patient ID: [name], female    DOB: [birth year], 39 y.o.   MRN: [id number]  HPI pt is referred by DR Alesia Richards, for diabetes.  Pt states DM was dx'ed in 2014, during a pregnancy, but it persisted after; she is unaware of any chronic complications; she has never been on insulin; pt says her diet and exercise are not good; she has never had pancreatitis, pancreatic surgery, or DKA.  She had an episode of severe hypoglycemia in 2018.  She is 5 weeks postpartum.  She takes metformin only.  She says cbg varies from 80-200's.  She is not breast feeding.  She is not considering another pregnancy.  She will have TL soon.  Past Medical History:  Diagnosis Date  . Abnormal Pap smear   . Anemia   . Asthma   . Depression   . Fibroid   . GDM (gestational diabetes mellitus)   . H/O varicella   . HSV (herpes simplex virus) anogenital infection   . Hx of bacterial infection   . Hyperemesis    During first pregnancy  . Hypertension   . Sinusitis   . Trichomonas 02/2011   treated  . Type 2 diabetes mellitus complicating pregnancy, antepartum   . Urinary tract infection   . Vaginal Pap smear, abnormal     Past Surgical History:  Procedure Laterality Date  . BREAST SURGERY    . NO PAST SURGERIES      Social History   Socioeconomic History  . Marital status: Single    Spouse name: Not on file  . Number of children: Not on file  . Years of education: Not on file  . Highest education level: Not on file  Occupational History  . Not on file  Tobacco Use  . Smoking status: Never Smoker  . Smokeless tobacco: Never Used  Substance and Sexual Activity  . Alcohol use: No  . Drug use: No  . Sexual activity: Yes    Birth control/protection: None    Comment: two weeks ago  Other Topics Concern  . Not on file  Social History Narrative  . Not on file   Social Determinants of Health   Financial Resource Strain:   . Difficulty of Paying Living Expenses:   Food Insecurity:     . Worried About Charity fundraiser in the Last Year:   . Arboriculturist in the Last Year:   Transportation Needs:   . Film/video editor (Medical):   Marland Kitchen Lack of Transportation (Non-Medical):   Physical Activity:   . Days of Exercise per Week:   . Minutes of Exercise per Session:   Stress:   . Feeling of Stress :   Social Connections:   . Frequency of Communication with Friends and Family:   . Frequency of Social Gatherings with Friends and Family:   . Attends Religious Services:   . Active Member of Clubs or Organizations:   . Attends Archivist Meetings:   Marland Kitchen Marital Status:   Intimate Partner Violence:   . Fear of Current or Ex-Partner:   . Emotionally Abused:   Marland Kitchen Physically Abused:   . Sexually Abused:     Current Outpatient Medications on File Prior to Visit  Medication Sig Dispense Refill  . acetaminophen (TYLENOL) 325 MG tablet Take 650 mg by mouth every 6 (six) hours as needed for mild pain or headache.    . clotrimazole-betamethasone (LOTRISONE) cream Apply 1 application topically  in the morning and at bedtime.    Marland Kitchen FLUoxetine (PROZAC) 20 MG capsule Take 20 mg by mouth every morning.    Marland Kitchen ibuprofen (ADVIL) 600 MG tablet Take 1 tablet (600 mg total) by mouth every 6 (six) hours. 30 tablet 0  . metFORMIN (GLUCOPHAGE-XR) 500 MG 24 hr tablet Take 1 tablet (500 mg total) by mouth daily with breakfast. 30 tablet 2  . Prenatal Vit-Fe Fumarate-FA (PRENATAL MULTIVITAMIN) TABS tablet Take 1 tablet by mouth daily.     . Doxylamine-Pyridoxine 10-10 MG TBEC Take 2 tablets by mouth at bedtime as needed. (Patient not taking: Reported on 01/24/2020) 60 tablet 0  . NIFEdipine (ADALAT CC) 60 MG 24 hr tablet Take 1 tablet (60 mg total) by mouth daily. 30 tablet 2   No current facility-administered medications on file prior to visit.    Allergies  Allergen Reactions  . Flagyl [Metronidazole Hcl] Swelling  . Latex Hives  . Other Other (See Comments)    Pt has a reaction  to plastic tape. She breaks out with a rash and her skin peels.    Family History  Problem Relation Age of Onset  . Diabetes Mother   . Hypertension Mother   . Peripheral vascular disease Mother   . Thyroid disease Mother   . Asthma Mother   . Diabetes Father   . Hypertension Father   . Seizures Maternal Aunt   . Alcohol abuse Maternal Aunt   . Heart attack Maternal Uncle   . Kidney disease Maternal Uncle   . Cancer Maternal Uncle        prostate  . Thyroid disease Paternal Aunt   . Heart attack Paternal Uncle   . Kidney disease Paternal Uncle   . Anesthesia problems Neg Hx   . Hypotension Neg Hx   . Malignant hyperthermia Neg Hx   . Pseudochol deficiency Neg Hx     BP (!) 144/88   Pulse 78   Ht 5\' 3"  (1.6 m)   Wt 267 lb (121.1 kg)   SpO2 96%   BMI 47.30 kg/m     Review of Systems denies blurry vision, chest pain, sob, n/v, and urinary frequency.  Depression is well-controlled.       Objective:   Physical Exam VS: see vs page GEN: no distress HEAD: head: no deformity eyes: no periorbital swelling, no proptosis external nose and ears are normal NECK: supple, thyroid is not enlarged CHEST WALL: no deformity LUNGS: clear to auscultation CV: reg rate and rhythm, no murmur.  MUSCULOSKELETAL: muscle bulk and strength are grossly normal.  no obvious joint swelling.  gait is normal and steady EXTEMITIES: no deformity.  Trace bilat leg edema PULSES: no carotid bruit NEURO:  cn 2-12 grossly intact.   readily moves all 4's.  sensation is intact to touch on all 4's SKIN:  Normal texture and temperature.  No rash or suspicious lesion is visible.  Several abrasions at the plantar aspect of the left foot.   NODES:  None palpable at the neck.   PSYCH: alert, well-oriented.  Does not appear anxious nor depressed.    Lab Results  Component Value Date   HGBA1C 6.3 (A) 01/24/2020   Lab Results  Component Value Date   CREATININE 0.65 12/22/2019   BUN 12 12/22/2019   NA  137 12/22/2019   K 4.0 12/22/2019   CL 105 12/22/2019   CO2 22 12/22/2019   I have reviewed outside records, and summarized: Pt was noted to  have elevated a1c, and referred here.  When HTN was noted at hosp d/c, she was advised to f/u in 1 week for this.      Assessment & Plan:  Type 2 DM, new to me: she needs increased rx HTN: is noted today   Patient Instructions  Your blood pressure is high today.  Please see your primary care provider soon, to have it rechecked.  good diet and exercise significantly improve the control of your diabetes.  please let me know if you wish to be referred to a dietician.  high blood sugar is very risky to your health.  you should see an eye doctor and dentist every year.  It is very important to get all recommended vaccinations.  Controlling your blood pressure and cholesterol drastically reduces the damage diabetes does to your body.  Those who smoke should quit.  Please discuss these with your doctor.  check your blood sugar once a day.  vary the time of day when you check, between before the 3 meals, and at bedtime.  also check if you have symptoms of your blood sugar being too high or too low.  please keep a record of the readings and bring it to your next appointment here (or you can bring the meter itself).  You can write it on any piece of paper.  please call us sooner if your blood sugar goes below 70, or if you have a lot of readings over 200.  Please continue the same metformin, and: I have sent a prescription to your pharmacy, to add "Rybelsus."   Blood tests are requested for you today.  We'll let you know about the results.  Please come back for a follow-up appointment in 2-3 months.     Bariatric Surgery You have so much to gain by losing weight.  You may have already tried every diet and exercise plan imaginable.  And, you may have sought advice from your family physician, too.   Sometimes, in spite of such diligent efforts, you may not be  able to achieve long-term results by yourself.  In cases of severe obesity, bariatric or weight loss surgery is a proven method of achieving long-term weight control.  Our Services Our bariatric surgery programs offer our patients new hope and long-term weight-loss solution.  Since introducing our services in 2003, we have conducted more than 2,400 successful procedures.  Our program is designated as a Programmer, multimedia by the Metabolic and Bariatric Surgery Accreditation and Quality Improvement Program (MBSAQIP), a IT trainer that sets rigorous patient safety and outcome standards.  Our program is also designated as a Ecologist by [employer].   Our exceptional weight-loss surgery team specializes in diagnosis, treatment, follow-up care, and ongoing support for our patients with severe weight loss challenges.  We currently offer laparoscopic sleeve gastrectomy, gastric bypass, and adjustable gastric band (LAP-BAND).    Attend our [place] Choosing to undergo a bariatric procedure is a big decision, and one that should not be taken lightly.  You now have two options in how you learn about weight-loss surgery - in person or online.  Our objective is to ensure you have all of the information that you need to evaluate the advantages and obligations of this life changing procedure.  Please note that you are not alone in this process, and our experienced team is ready to assist and answer all of your questions.  There are several ways to register for a seminar (  either on-line or in person): 1)  Call [phone] 2) Go on-line to [facility] and register for either type of seminar.  [email or web address]       Diabetes Mellitus and Nutrition, Adult When you have diabetes (diabetes mellitus), it is very important to have healthy eating habits because your blood sugar (glucose) levels are greatly affected by what you eat and  drink. Eating healthy foods in the appropriate amounts, at about the same times every day, can help you:  Control your blood glucose.  Lower your risk of heart disease.  Improve your blood pressure.  Reach or maintain a healthy weight. Every person with diabetes is different, and each person has different needs for a meal plan. Your health care provider may recommend that you work with a diet and nutrition specialist (dietitian) to make a meal plan that is best for you. Your meal plan may vary depending on factors such as:  The calories you need.  The medicines you take.  Your weight.  Your blood glucose, blood pressure, and cholesterol levels.  Your activity level.  Other health conditions you have, such as heart or kidney disease. How do carbohydrates affect me? Carbohydrates, also called carbs, affect your blood glucose level more than any other type of food. Eating carbs naturally raises the amount of glucose in your blood. Carb counting is a method for keeping track of how many carbs you eat. Counting carbs is important to keep your blood glucose at a healthy level, especially if you use insulin or take certain oral diabetes medicines. It is important to know how many carbs you can safely have in each meal. This is different for every person. Your dietitian can help you calculate how many carbs you should have at each meal and for each snack. Foods that contain carbs include:  Bread, cereal, rice, pasta, and crackers.  Potatoes and corn.  Peas, beans, and lentils.  Milk and yogurt.  Fruit and juice.  Desserts, such as cakes, cookies, ice cream, and candy. How does alcohol affect me? Alcohol can cause a sudden decrease in blood glucose (hypoglycemia), especially if you use insulin or take certain oral diabetes medicines. Hypoglycemia can be a life-threatening condition. Symptoms of hypoglycemia (sleepiness, dizziness, and confusion) are similar to symptoms of having too  much alcohol. If your health care provider says that alcohol is safe for you, follow these guidelines:  Limit alcohol intake to no more than 1 drink per day for nonpregnant women and 2 drinks per day for men. One drink equals 12 oz of beer, 5 oz of wine, or 1 oz of hard liquor.  Do not drink on an empty stomach.  Keep yourself hydrated with water, diet soda, or unsweetened iced tea.  Keep in mind that regular soda, juice, and other mixers may contain a lot of sugar and must be counted as carbs. What are tips for following this plan?  Reading food labels  Start by checking the serving size on the "Nutrition Facts" label of packaged foods and drinks. The amount of calories, carbs, fats, and other nutrients listed on the label is based on one serving of the item. Many items contain more than one serving per package.  Check the total grams (g) of carbs in one serving. You can calculate the number of servings of carbs in one serving by dividing the total carbs by 15. For example, if a food has 30 g of total carbs, it would be equal to 2 servings of carbs.  Check the number of grams (g) of saturated and trans fats in one serving. Choose foods that have low or no amount of these fats.  Check the number of milligrams (mg) of salt (sodium) in one serving. Most people should limit total sodium intake to less than 2,300 mg per day.  Always check the nutrition information of foods labeled as "low-fat" or "nonfat". These foods may be higher in added sugar or refined carbs and should be avoided.  Talk to your dietitian to identify your daily goals for nutrients listed on the label. Shopping  Avoid buying canned, premade, or processed foods. These foods tend to be high in fat, sodium, and added sugar.  Shop around the outside edge of the grocery store. This includes fresh fruits and vegetables, bulk grains, fresh meats, and fresh dairy. Cooking  Use low-heat cooking methods, such as baking, instead  of high-heat cooking methods like deep frying.  Cook using healthy oils, such as olive, canola, or sunflower oil.  Avoid cooking with butter, cream, or high-fat meats. Meal planning  Eat meals and snacks regularly, preferably at the same times every day. Avoid going long periods of time without eating.  Eat foods high in fiber, such as fresh fruits, vegetables, beans, and whole grains. Talk to your dietitian about how many servings of carbs you can eat at each meal.  Eat 4-6 ounces (oz) of lean protein each day, such as lean meat, chicken, fish, eggs, or tofu. One oz of lean protein is equal to: ? 1 oz of meat, chicken, or fish. ? 1 egg. ?  cup of tofu.  Eat some foods each day that contain healthy fats, such as avocado, nuts, seeds, and fish. Lifestyle  Check your blood glucose regularly.  Exercise regularly as told by your health care provider. This may include: ? 150 minutes of moderate-intensity or vigorous-intensity exercise each week. This could be brisk walking, biking, or water aerobics. ? Stretching and doing strength exercises, such as yoga or weightlifting, at least 2 times a week.  Take medicines as told by your health care provider.  Do not use any products that contain nicotine or tobacco, such as cigarettes and e-cigarettes. If you need help quitting, ask your health care provider.  Work with a Social worker or diabetes educator to identify strategies to manage stress and any emotional and social challenges. Questions to ask a health care provider  Do I need to meet with a diabetes educator?  Do I need to meet with a dietitian?  What number can I call if I have questions?  When are the best times to check my blood glucose? Where to find more information:  American Diabetes Association: diabetes.org  Academy of Nutrition and Dietetics: www.eatright.[employer] of Diabetes and Digestive and Kidney Diseases (NIH): [employer] Summary  A  healthy meal plan will help you control your blood glucose and maintain a healthy lifestyle.  Working with a diet and nutrition specialist (dietitian) can help you make a meal plan that is best for you.  Keep in mind that carbohydrates (carbs) and alcohol have immediate effects on your blood glucose levels. It is important to count carbs and to use alcohol carefully. This information is not intended to replace advice given to you by your health care provider. Make sure you discuss any questions you have with your health care provider. Document Revised: 08/01/2017 Document Reviewed: 09/23/2016 Elsevier Patient Education  2020 [employer].

## 2020-01-25 ENCOUNTER — Telehealth: Payer: Self-pay

## 2020-01-25 NOTE — Telephone Encounter (Signed)
-----   Message from Renato Shin, MD sent at 01/24/2020  6:52 PM EDT ----- please contact patient: Thyroid is normal--good.  I'll see you next time.

## 2020-01-25 NOTE — Telephone Encounter (Signed)
LAB RESULTS  Lab results were reviewed by Dr. Ellison. A letter has been mailed to pt home address. For future reference, letter can be found in Epic. 

## 2020-01-27 DIAGNOSIS — E119 Type 2 diabetes mellitus without complications: Secondary | ICD-10-CM | POA: Insufficient documentation

## 2020-03-27 ENCOUNTER — Ambulatory Visit: Payer: 59 | Admitting: Endocrinology

## 2020-03-27 DIAGNOSIS — E119 Type 2 diabetes mellitus without complications: Secondary | ICD-10-CM

## 2020-03-27 DIAGNOSIS — Z0289 Encounter for other administrative examinations: Secondary | ICD-10-CM

## 2020-06-06 IMAGING — US US MFM FETAL BPP W/O NON-STRESS
1 series · 15 of 21 positions shown · non-contrast
Comparison: none

[Series 1: us mfm fetal bpp w/o non-stress · 21 acquisitions, 15 frames shown]
[im 1/21]
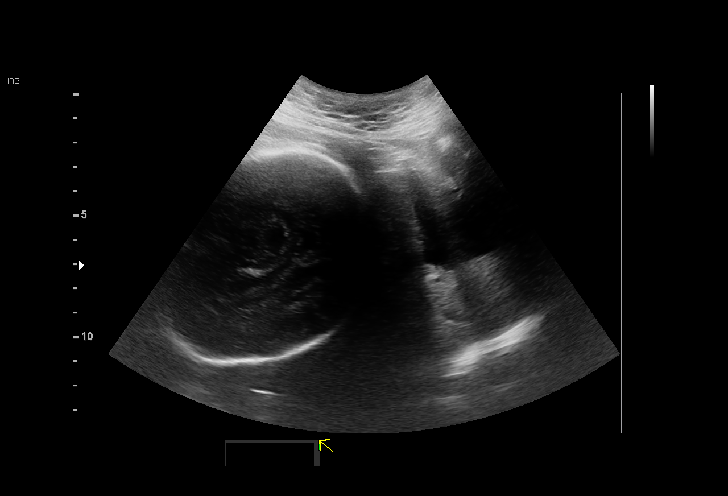
[im 3/21]
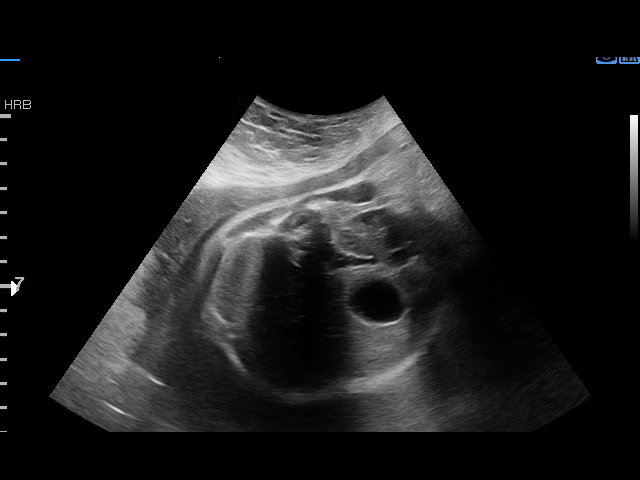
[im 4/21]
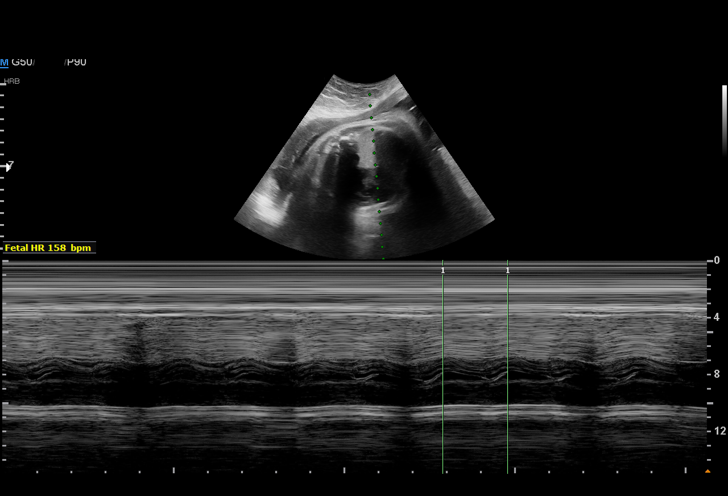
[im 5/21]
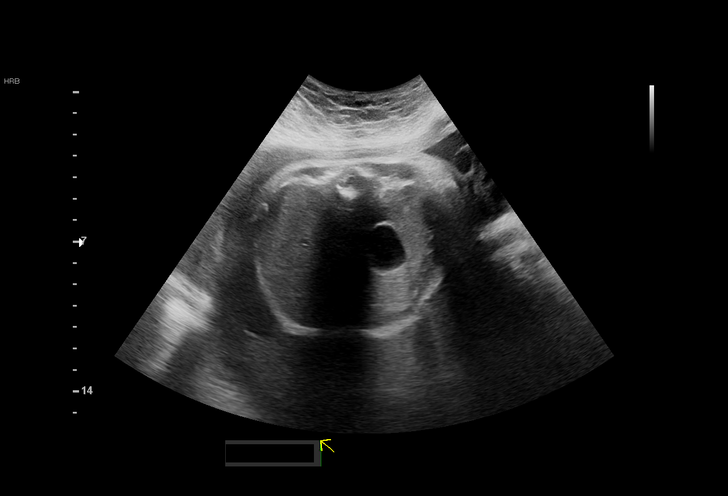
[im 7/21]
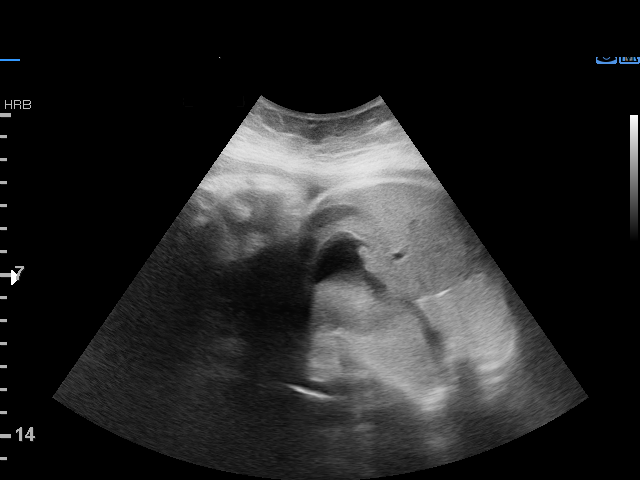
[im 8/21]
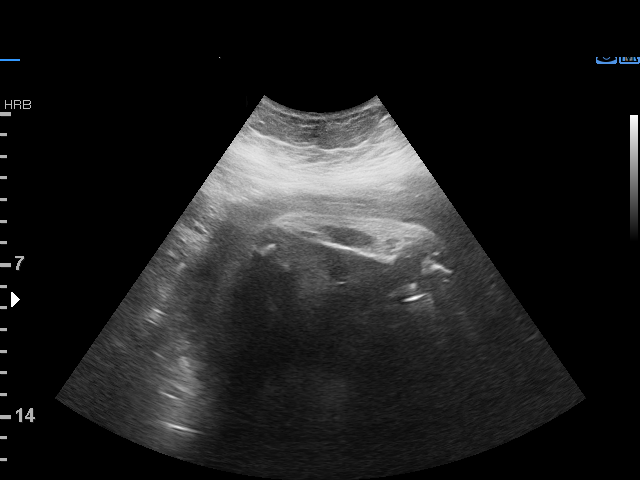
[im 10/21]
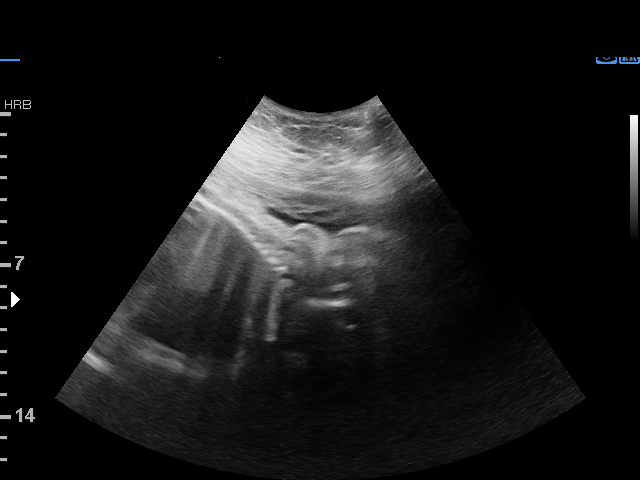
[im 11/21]
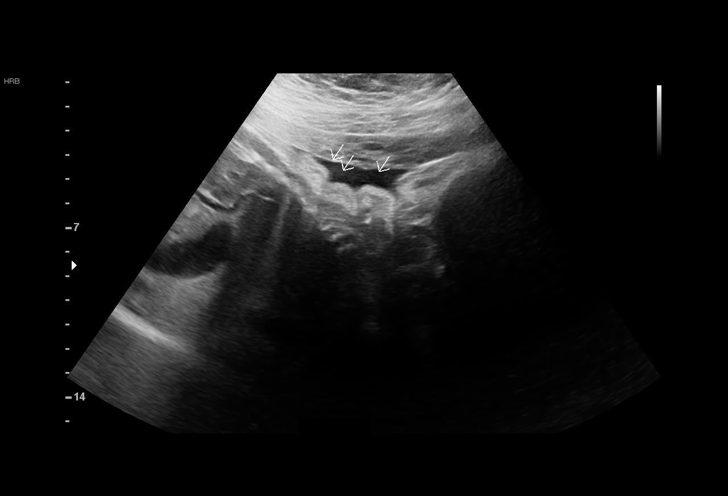
[im 12/21]
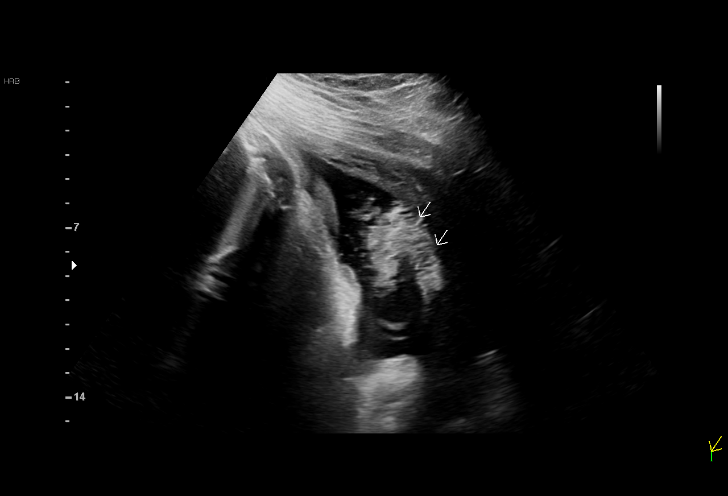
[im 14/21]
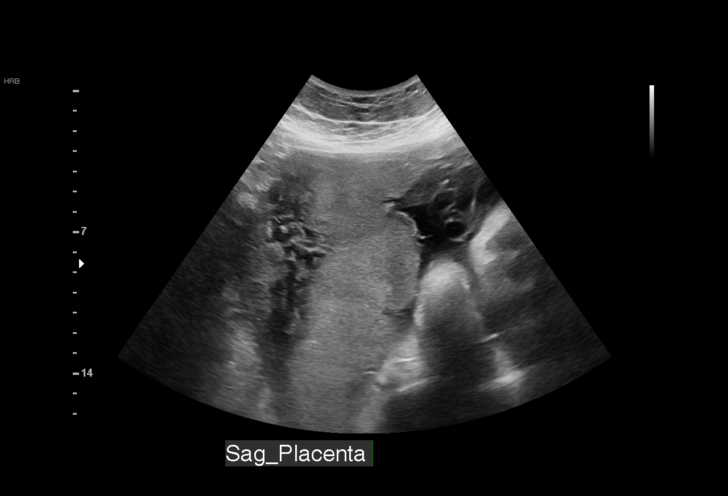
[im 15/21]
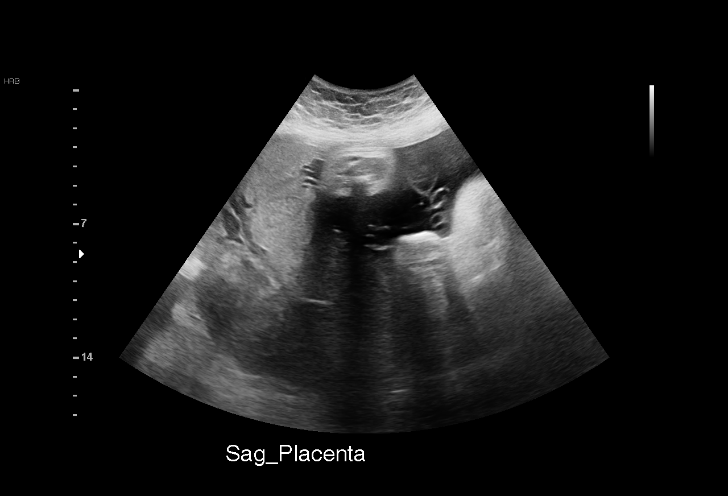
[im 17/21]
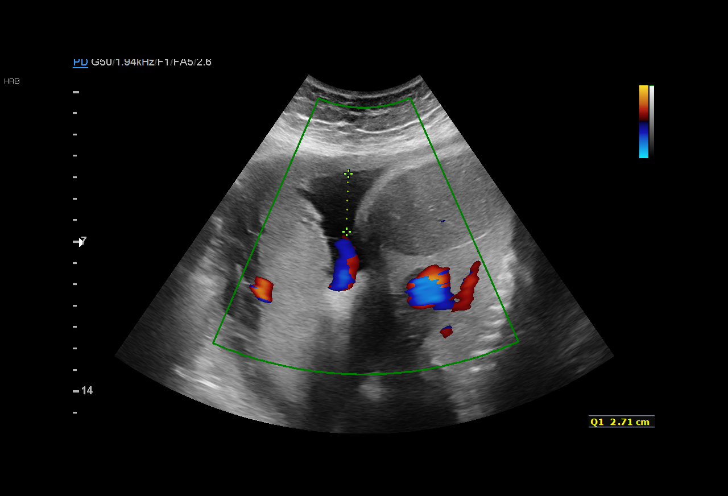
[im 18/21]
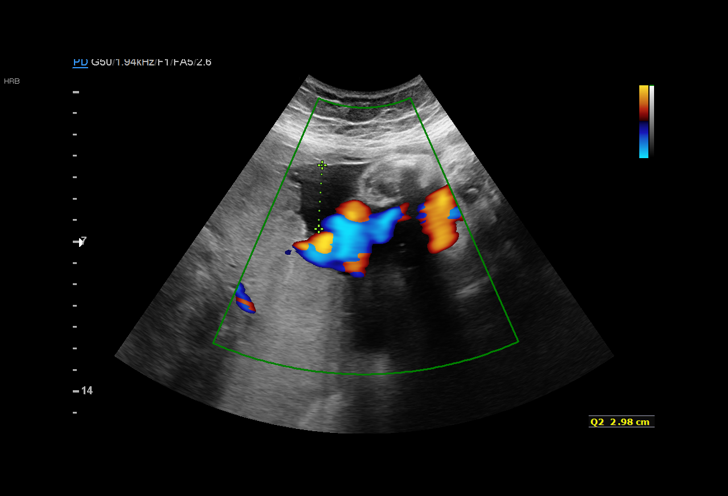
[im 19/21]
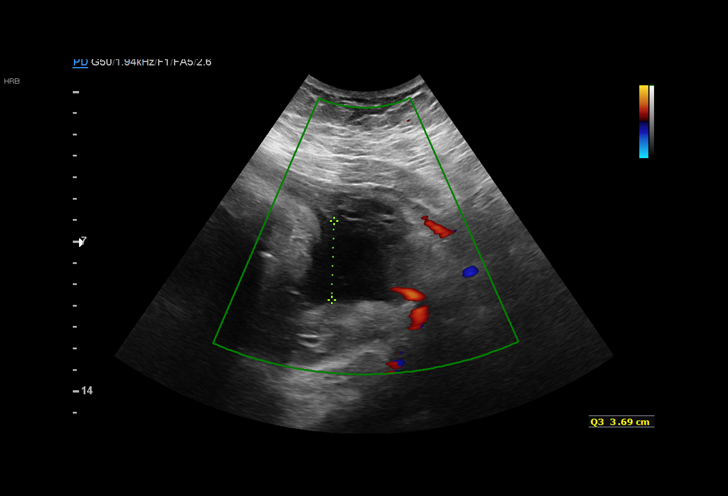
[im 21/21]
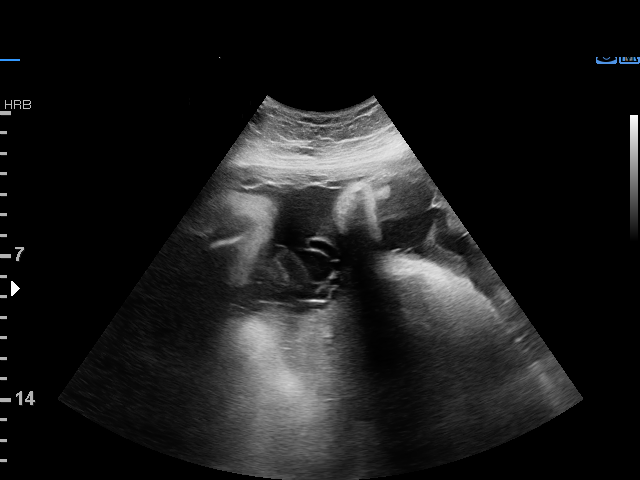

[15 of 21 positions shown; findings below may reference images not displayed]

Obstetrics &
                                                            Gynecology
                                                            [id number]
                                                            [place].
                   [facility]

 ----------------------------------------------------------------------

 ----------------------------------------------------------------------
Indications

  Pre-existing diabetes, type 2, in pregnancy,
  third trimester
  Abnormal finding on antenatal screening
  (low FF Panorama, low risk NaterniEEX)
  Hypertension - Chronic/Pre-existing (does
  not take meds)
  Obesity complicating pregnancy, third
  trimester
  33 weeks gestation of pregnancy
 ----------------------------------------------------------------------
Fetal Evaluation

 Num Of Fetuses:         1
 Fetal Heart Rate(bpm):  158
 Cardiac Activity:       Observed
 Presentation:           Cephalic
 Placenta:               Posterior

 Amniotic Fluid
 AFI FV:      Within normal limits

 AFI Sum(cm)     %Tile       Largest Pocket(cm)
 11.27           28

 RUQ(cm)       RLQ(cm)       LUQ(cm)        LLQ(cm)

Biophysical Evaluation

 Amniotic F.V:   Within normal limits       F. Tone:        Observed
 F. Movement:    Observed                   Score:          [DATE]
 F. Breathing:   Observed
OB History

 Gravidity:    4         Term:   3        Prem:   0        SAB:   0
 TOP:          0       Ectopic:  0        Living: 3
Gestational Age

 LMP:           33w 6d        Date:  04/06/19                 EDD:   01/11/20
 Best:          33w 6d     Det. By:  LMP  (04/06/19)          EDD:   01/11/20
Anatomy

 Thoracic:              Appears normal         Bladder:                Appears normal
 Stomach:               Appears normal, left
                        sided
Cervix Uterus Adnexa

 Cervix
 Not visualized (advanced GA >15wks)
Comments

 This patient was seen for a biophysical profile due to a history
 of chronic hypertension, A2 gestational diabetes currently
 treated with Metformin, and maternal obesity.  She denies
 any problems since her last exam.  A review of her fingerstick
 values shows that she has some elevated 2-hour
 postprandial fingerstick values.
 A biophysical profile performed today was [DATE].
 There was normal amniotic fluid noted on today's ultrasound
 exam.
 Another biophysical profile was scheduled in 1 week.

## 2022-04-18 ENCOUNTER — Encounter (HOSPITAL_COMMUNITY): Payer: Self-pay | Admitting: Obstetrics and Gynecology

## 2022-04-18 ENCOUNTER — Inpatient Hospital Stay (HOSPITAL_COMMUNITY): Payer: Medicaid Other

## 2022-04-18 ENCOUNTER — Inpatient Hospital Stay (HOSPITAL_COMMUNITY)
Admission: AD | Admit: 2022-04-18 | Discharge: 2022-04-19 | Disposition: A | Discharge status: Home / Self Care | Payer: Medicaid Other | Attending: Obstetrics and Gynecology | Admitting: Obstetrics and Gynecology

## 2022-04-18 DIAGNOSIS — Z79899 Other long term (current) drug therapy: Secondary | ICD-10-CM | POA: Insufficient documentation

## 2022-04-18 DIAGNOSIS — O3411 Maternal care for benign tumor of corpus uteri, first trimester: Secondary | ICD-10-CM | POA: Insufficient documentation

## 2022-04-18 DIAGNOSIS — O09521 Supervision of elderly multigravida, first trimester: Secondary | ICD-10-CM | POA: Insufficient documentation

## 2022-04-18 DIAGNOSIS — Z3A11 11 weeks gestation of pregnancy: Secondary | ICD-10-CM | POA: Diagnosis not present

## 2022-04-18 DIAGNOSIS — O219 Vomiting of pregnancy, unspecified: Secondary | ICD-10-CM | POA: Diagnosis not present

## 2022-04-18 DIAGNOSIS — D259 Leiomyoma of uterus, unspecified: Secondary | ICD-10-CM | POA: Insufficient documentation

## 2022-04-18 DIAGNOSIS — O10911 Unspecified pre-existing hypertension complicating pregnancy, first trimester: Secondary | ICD-10-CM | POA: Diagnosis not present

## 2022-04-18 DIAGNOSIS — O99891 Other specified diseases and conditions complicating pregnancy: Secondary | ICD-10-CM | POA: Diagnosis not present

## 2022-04-18 DIAGNOSIS — O26891 Other specified pregnancy related conditions, first trimester: Secondary | ICD-10-CM | POA: Insufficient documentation

## 2022-04-18 LAB — URINALYSIS, ROUTINE W REFLEX MICROSCOPIC
Bacteria, UA: NONE SEEN
Bilirubin Urine: NEGATIVE
Glucose, UA: NEGATIVE mg/dL
Hgb urine dipstick: NEGATIVE
Ketones, ur: 80 mg/dL — AB
Leukocytes,Ua: NEGATIVE
Nitrite: NEGATIVE
Protein, ur: 100 mg/dL — AB
Specific Gravity, Urine: 1.035 — ABNORMAL HIGH (ref 1.005–1.030)
pH: 5 (ref 5.0–8.0)

## 2022-04-18 LAB — CBC
HCT: 35 % — ABNORMAL LOW (ref 36.0–46.0)
Hemoglobin: 11.9 g/dL — ABNORMAL LOW (ref 12.0–15.0)
MCH: 30.4 pg (ref 26.0–34.0)
MCHC: 34 g/dL (ref 30.0–36.0)
MCV: 89.3 fL (ref 80.0–100.0)
Platelets: 273 10*3/uL (ref 150–400)
RBC: 3.92 MIL/uL (ref 3.87–5.11)
RDW: 11.3 % — ABNORMAL LOW (ref 11.5–15.5)
WBC: 6.9 10*3/uL (ref 4.0–10.5)
nRBC: 0 % (ref 0.0–0.2)

## 2022-04-18 LAB — POCT PREGNANCY, URINE: Preg Test, Ur: POSITIVE — AB

## 2022-04-18 MED ORDER — M.V.I. ADULT IV INJ
Freq: Once | INTRAVENOUS | Status: DC
  Filled 2022-04-18: qty 10

## 2022-04-18 MED ORDER — METOCLOPRAMIDE HCL 10 MG PO TABS: 10.0000 mg | ORAL_TABLET | Freq: Four times a day (QID) | ORAL | 0 refills | Status: DC

## 2022-04-18 MED ORDER — PROMETHAZINE HCL 25 MG RE SUPP: 25.0000 mg | Freq: Four times a day (QID) | RECTAL | 0 refills | Status: DC | PRN

## 2022-04-18 MED ORDER — GLYCOPYRROLATE 0.2 MG/ML IJ SOLN
0.1000 mg | Freq: Once | INTRAMUSCULAR | Status: AC
  Administered 2022-04-18: 0.1 mg via INTRAVENOUS
  Filled 2022-04-18: qty 1

## 2022-04-18 MED ORDER — FAMOTIDINE IN NACL 20-0.9 MG/50ML-% IV SOLN
20.0000 mg | Freq: Once | INTRAVENOUS | Status: AC
  Administered 2022-04-18: 20 mg via INTRAVENOUS
  Filled 2022-04-18: qty 50

## 2022-04-18 MED ORDER — SCOPOLAMINE 1 MG/3DAYS TD PT72: 1.0000 | MEDICATED_PATCH | TRANSDERMAL | 12 refills | Status: DC

## 2022-04-18 MED ORDER — SCOPOLAMINE 1 MG/3DAYS TD PT72
1.0000 | MEDICATED_PATCH | TRANSDERMAL | Status: DC
  Administered 2022-04-18: 1.5 mg via TRANSDERMAL
  Filled 2022-04-18: qty 1

## 2022-04-18 MED ORDER — LACTATED RINGERS IV BOLUS
1000.0000 mL | Freq: Once | INTRAVENOUS | Status: AC
  Administered 2022-04-18: 1000 mL via INTRAVENOUS

## 2022-04-18 MED ORDER — PROCHLORPERAZINE EDISYLATE 10 MG/2ML IJ SOLN
10.0000 mg | Freq: Once | INTRAMUSCULAR | Status: AC
  Administered 2022-04-18: 10 mg via INTRAVENOUS
  Filled 2022-04-18: qty 2

## 2022-04-18 NOTE — MAU Provider Note (Signed)
History     CSN: [id number]  Arrival date and time: 04/18/22 2056   Event Date/Time   First Provider Initiated Contact with Patient 04/18/22 2210      Chief Complaint  Patient presents with   Emesis During Pregnancy   HPI  [name] is a 41 y.o. [id number] at approximately 3 months pregnant by patient's report who presents for evaluation of nausea and vomiting. Patient reports she has been vomiting all day for a week but today it was worse. She reports she saw blood in the toilet after vomiting and the nurse line told her to come in. She reports she has hyperemesis with every pregnancy and this feels the same to her. She has tried phenergan and zofran with no relief. She is also spitting.   She reports she is having lower back pain that radiates to the front.  Patient rates the pain as a 8/10 and has not tried anything for the pain.   She denies any vaginal bleeding and discharge. Denies any constipation, diarrhea or any urinary complaints  She reports she has stopped all medications for her chronic medication conditions including hypertension and diabetes. She reports she is only taking her Prozac   OB History     Gravida  5   Para  4   Term  4   Preterm  0   AB  0   Living  4      SAB  0   IAB  0   Ectopic  0   Multiple  0   Live Births  4           Past Medical History:  Diagnosis Date   Abnormal Pap smear    Anemia    Asthma    Depression    Fibroid    GDM (gestational diabetes mellitus)    H/O varicella    HSV (herpes simplex virus) anogenital infection    Hx of bacterial infection    Hyperemesis    During first pregnancy   Hypertension    Sinusitis    Trichomonas 02/2011   treated   Type 2 diabetes mellitus complicating pregnancy, antepartum    Urinary tract infection    Vaginal Pap smear, abnormal     Past Surgical History:  Procedure Laterality Date   BREAST SURGERY     NO PAST SURGERIES      Family History  Problem  Relation Age of Onset   Diabetes Mother    Hypertension Mother    Peripheral vascular disease Mother    Thyroid disease Mother    Asthma Mother    Diabetes Father    Hypertension Father    Seizures Maternal Aunt    Alcohol abuse Maternal Aunt    Heart attack Maternal Uncle    Kidney disease Maternal Uncle    Cancer Maternal Uncle        prostate   Thyroid disease Paternal Aunt    Heart attack Paternal Uncle    Kidney disease Paternal Uncle    Anesthesia problems Neg Hx    Hypotension Neg Hx    Malignant hyperthermia Neg Hx    Pseudochol deficiency Neg Hx     Social History   Tobacco Use   Smoking status: Never   Smokeless tobacco: Never  Vaping Use   Vaping Use: Never used  Substance Use Topics   Alcohol use: No   Drug use: No    Allergies:  Allergies  Allergen Reactions   Flagyl [  Metronidazole Hcl] Swelling   Latex Hives   Other Other (See Comments)    Pt has a reaction to plastic tape. She breaks out with a rash and her skin peels.    Medications Prior to Admission  Medication Sig Dispense Refill Last Dose   FLUoxetine (PROZAC) 20 MG capsule Take 20 mg by mouth every morning.   04/18/2022   ondansetron (ZOFRAN-ODT) 4 MG disintegrating tablet Take 4 mg by mouth every 8 (eight) hours as needed for nausea or vomiting.   04/18/2022   promethazine (PHENERGAN) 12.5 MG tablet Take 12.5 mg by mouth every 6 (six) hours as needed for nausea or vomiting.   04/18/2022   acetaminophen (TYLENOL) 325 MG tablet Take 650 mg by mouth every 6 (six) hours as needed for mild pain or headache.      clotrimazole-betamethasone (LOTRISONE) cream Apply 1 application topically in the morning and at bedtime.      Doxylamine-Pyridoxine 10-10 MG TBEC Take 2 tablets by mouth at bedtime as needed. (Patient not taking: Reported on 01/24/2020) 60 tablet 0    ibuprofen (ADVIL) 600 MG tablet Take 1 tablet (600 mg total) by mouth every 6 (six) hours. 30 tablet 0    metFORMIN (GLUCOPHAGE-XR) 500 MG 24  hr tablet Take 1 tablet (500 mg total) by mouth daily with breakfast. 30 tablet 2    NIFEdipine (ADALAT CC) 60 MG 24 hr tablet Take 1 tablet (60 mg total) by mouth daily. 30 tablet 2    Prenatal Vit-Fe Fumarate-FA (PRENATAL MULTIVITAMIN) TABS tablet Take 1 tablet by mouth daily.       Semaglutide (RYBELSUS) 3 MG TABS Take 3 mg by mouth daily. 30 tablet 11     Review of Systems  Constitutional: Negative.  Negative for fatigue and fever.  HENT: Negative.    Respiratory: Negative.  Negative for shortness of breath.   Cardiovascular: Negative.  Negative for chest pain.  Gastrointestinal:  Positive for abdominal pain, nausea and vomiting. Negative for constipation and diarrhea.  Genitourinary: Negative.  Negative for dysuria.  Musculoskeletal:  Positive for back pain.  Neurological: Negative.  Negative for dizziness and headaches.   Physical Exam   Blood pressure 135/74, pulse 85, resp. rate 20, height '5\' 3"'$  (1.6 m), weight 127 kg, SpO2 100 %, unknown if currently breastfeeding.  Patient Vitals for the past 24 hrs:  BP Pulse Resp SpO2 Height Weight  04/18/22 2140 135/74 85 20 100 % '5\' 3"'$  (1.6 m) 127 kg    Physical Exam Vitals and nursing note reviewed.  Constitutional:      General: She is not in acute distress.    Appearance: She is well-developed. She is ill-appearing.  HENT:     Head: Normocephalic.     Mouth/Throat:     Lips: No lesions.     Mouth: Mucous membranes are pale and dry.  Eyes:     Pupils: Pupils are equal, round, and reactive to light.  Cardiovascular:     Rate and Rhythm: Normal rate and regular rhythm.     Heart sounds: Normal heart sounds.  Pulmonary:     Effort: Pulmonary effort is normal. No respiratory distress.     Breath sounds: Normal breath sounds.  Abdominal:     General: Bowel sounds are normal. There is no distension.     Palpations: Abdomen is soft.     Tenderness: There is no abdominal tenderness.  Skin:    General: Skin is warm and dry.   Neurological:  Mental Status: She is alert and oriented to person, place, and time.  Psychiatric:        Mood and Affect: Mood normal.        Behavior: Behavior normal.        Thought Content: Thought content normal.        Judgment: Judgment normal.    MAU Course  Procedures  Results for orders placed or performed during the hospital encounter of 04/18/22 (from the past 24 hour(s))  Pregnancy, urine POC     Status: Abnormal   Collection Time: 04/18/22  9:16 PM  Result Value Ref Range   Preg Test, Ur POSITIVE (A) NEGATIVE  Urinalysis, Routine w reflex microscopic Urine, Clean Catch     Status: Abnormal   Collection Time: 04/18/22  9:16 PM  Result Value Ref Range   Color, Urine YELLOW YELLOW   APPearance HAZY (A) CLEAR   Specific Gravity, Urine 1.035 (H) 1.005 - 1.030   pH 5.0 5.0 - 8.0   Glucose, UA NEGATIVE NEGATIVE mg/dL   Hgb urine dipstick NEGATIVE NEGATIVE   Bilirubin Urine NEGATIVE NEGATIVE   Ketones, ur 80 (A) NEGATIVE mg/dL   Protein, ur 100 (A) NEGATIVE mg/dL   Nitrite NEGATIVE NEGATIVE   Leukocytes,Ua NEGATIVE NEGATIVE   RBC / HPF 0-5 0 - 5 RBC/hpf   WBC, UA 0-5 0 - 5 WBC/hpf   Bacteria, UA NONE SEEN NONE SEEN   Squamous Epithelial / LPF 0-5 0 - 5   Mucus PRESENT   CBC     Status: Abnormal   Collection Time: 04/18/22 10:31 PM  Result Value Ref Range   WBC 6.9 4.0 - 10.5 K/uL   RBC 3.92 3.87 - 5.11 MIL/uL   Hemoglobin 11.9 (L) 12.0 - 15.0 g/dL   HCT 35.0 (L) 36.0 - 46.0 %   MCV 89.3 80.0 - 100.0 fL   MCH 30.4 26.0 - 34.0 pg   MCHC 34.0 30.0 - 36.0 g/dL   RDW 11.3 (L) 11.5 - 15.5 %   Platelets 273 150 - 400 K/uL   nRBC 0.0 0.0 - 0.2 %     US OB Comp Less 14 Wks  Result Date: 04/18/2022 CLINICAL DATA:  Low back pain. EXAM: OBSTETRIC <14 WK ULTRASOUND TECHNIQUE: Transabdominal ultrasound was performed for evaluation of the gestation as well as the maternal uterus and adnexal regions. COMPARISON:  12/06/2019. FINDINGS: Intrauterine gestational sac:  Single Yolk sac:  No Embryo:  Yes Cardiac Activity: Yes Heart Rate: 173 bpm CRL:   45.4 mm   11 w 2 d                  [place] EDC: 11/05/2022. Subchorionic hemorrhage:  None visualized. Maternal uterus/adnexae: The ovaries are not seen on exam. A large fibroid is noted in the posterior uterus on the left measuring 10.6 x 8.0 x 9.0 cm. No free fluid. IMPRESSION: 1. Single live intrauterine pregnancy with estimated gestational age of [redacted] weeks 2 days. EDC: 11/05/2022. 2. Large uterine fibroid. Electronically Signed   By: Brett Fairy M.D.   On: 04/18/2022 23:22     MDM Labs ordered and reviewed.   UA,UPT CBC, HCG, CMP ABO/Rh- A Pos US OB Comp Less 14 weeks with Transvaginal  LR bolus Pepcid Robinol Scop patch Compazine  CNM independently reviewed the imaging ordered. Imaging show live IUP measuring 11 weeks  After return from ultrasound, patient states she is feeling better and wants to leave. About 345m of IV fluids infused  total and medications have been given. CNM at bedside to discuss results of urinalysis and bloodwork still pending. CNM discussed with patient recommendation for completion of current IV fluids at least but recommendation for MVI. Patient states her mom drove her, it's too late at night and she is sleepy so she desires to leave. She reports she has an appointment with her OB at 1000 and wants to follow up there.   Assessment and Plan   1. Nausea and vomiting during pregnancy   2. [redacted] weeks gestation of pregnancy   3. Back pain affecting pregnancy in first trimester   4. Leiomyoma of uterus affecting pregnancy in first trimester    -Discharge home in stable condition -Rx for scop patches, phenergan suppositories, reglan sent to pharmacy -Nausea and vomiting precautions discussed -Patient advised to follow-up with OB as scheduled tomorrow for prenatal care -Patient may return to MAU as needed or if her condition were to change or worsen  Wende Mott, CNM 04/18/2022,  11:37 PM

## 2022-04-18 NOTE — Discharge Instructions (Signed)

## 2022-04-18 NOTE — MAU Note (Addendum)
.  [name] is a 41 y.o. at Unknown here in MAU reporting n/v for a week. Has promethazine and Zofran but not helping now. Spits a lot. Some lower back pain and headache only when she throws up.  Saw blood in emesis today.  LMP: unknown Onset of complaint: one week Pain score: 10 for back  Vitals:   04/18/22 2140  BP: 135/74  Pulse: 85  Resp: 20  SpO2: 100%     FHT:n/a Lab orders placed from triage: u/a

## 2022-04-19 LAB — COMPREHENSIVE METABOLIC PANEL
ALT: 18 U/L (ref 0–44)
AST: 21 U/L (ref 15–41)
Albumin: 3.8 g/dL (ref 3.5–5.0)
Alkaline Phosphatase: 40 U/L (ref 38–126)
Anion gap: 10 (ref 5–15)
BUN: 12 mg/dL (ref 6–20)
CO2: 22 mmol/L (ref 22–32)
Calcium: 9.2 mg/dL (ref 8.9–10.3)
Chloride: 104 mmol/L (ref 98–111)
Creatinine, Ser: 0.58 mg/dL (ref 0.44–1.00)
GFR, Estimated: >60 mL/min (ref >60)
Glucose, Bld: 154 mg/dL — ABNORMAL HIGH (ref 70–99)
Potassium: 3.5 mmol/L (ref 3.5–5.1)
Sodium: 136 mmol/L (ref 135–145)
Total Bilirubin: 0.6 mg/dL (ref 0.3–1.2)
Total Protein: 7.4 g/dL (ref 6.5–8.1)

## 2022-04-19 LAB — HCG, QUANTITATIVE, PREGNANCY: hCG, Beta Chain, Quant, S: 45733 m[IU]/mL — ABNORMAL HIGH (ref <5)

## 2022-05-02 ENCOUNTER — Encounter (HOSPITAL_COMMUNITY): Payer: Self-pay | Admitting: Obstetrics and Gynecology

## 2022-05-02 ENCOUNTER — Inpatient Hospital Stay (HOSPITAL_COMMUNITY)
Admission: AD | Admit: 2022-05-02 | Discharge: 2022-05-02 | Discharge status: Against Medical Advice | Payer: Medicaid Other | Attending: Obstetrics and Gynecology | Admitting: Obstetrics and Gynecology

## 2022-05-02 DIAGNOSIS — Z5329 Procedure and treatment not carried out because of patient's decision for other reasons: Secondary | ICD-10-CM

## 2022-05-02 DIAGNOSIS — O219 Vomiting of pregnancy, unspecified: Secondary | ICD-10-CM | POA: Diagnosis not present

## 2022-05-02 DIAGNOSIS — O09521 Supervision of elderly multigravida, first trimester: Secondary | ICD-10-CM | POA: Diagnosis not present

## 2022-05-02 DIAGNOSIS — Z3A13 13 weeks gestation of pregnancy: Secondary | ICD-10-CM

## 2022-05-02 LAB — URINALYSIS, ROUTINE W REFLEX MICROSCOPIC
Bilirubin Urine: NEGATIVE
Glucose, UA: NEGATIVE mg/dL
Hgb urine dipstick: NEGATIVE
Ketones, ur: NEGATIVE mg/dL
Leukocytes,Ua: NEGATIVE
Nitrite: NEGATIVE
Protein, ur: 30 mg/dL — AB
Specific Gravity, Urine: 1.029 (ref 1.005–1.030)
pH: 6 (ref 5.0–8.0)

## 2022-05-02 LAB — OB RESULTS CONSOLE RPR: RPR: NONREACTIVE

## 2022-05-02 LAB — OB RESULTS CONSOLE HIV ANTIBODY (ROUTINE TESTING): HIV: NONREACTIVE

## 2022-05-02 LAB — HEPATITIS C ANTIBODY: HCV Ab: NEGATIVE

## 2022-05-02 LAB — OB RESULTS CONSOLE HEPATITIS B SURFACE ANTIGEN: Hepatitis B Surface Ag: NEGATIVE

## 2022-05-02 LAB — OB RESULTS CONSOLE RUBELLA ANTIBODY, IGM: Rubella: IMMUNE

## 2022-05-02 MED ORDER — LACTATED RINGERS IV BOLUS
1000.0000 mL | Freq: Once | INTRAVENOUS | Status: AC
  Administered 2022-05-02: 1000 mL via INTRAVENOUS

## 2022-05-02 MED ORDER — FAMOTIDINE IN NACL 20-0.9 MG/50ML-% IV SOLN
20.0000 mg | Freq: Once | INTRAVENOUS | Status: AC
  Administered 2022-05-02: 20 mg via INTRAVENOUS
  Filled 2022-05-02: qty 50

## 2022-05-02 MED ORDER — PROCHLORPERAZINE EDISYLATE 10 MG/2ML IJ SOLN
10.0000 mg | Freq: Once | INTRAMUSCULAR | Status: AC
  Administered 2022-05-02: 10 mg via INTRAVENOUS
  Filled 2022-05-02: qty 2

## 2022-05-02 NOTE — MAU Note (Signed)
[name] is a 41 y.o. at Unknown here in MAU reporting: weak.  Trying to hold down water, gatorade, pedilite, nothing is working.  Can't keep anything down, still spitting.  Can't take Robinul, made her have a panic attack.  Can't do the patch either.  Trying suppositories and zofran.   Chest and throat are burning   Onset of complaint: ongoing problem Pain score: 10 chest and throat Vitals:   05/02/22 1600  BP: (!) 146/89  Pulse: 84  Resp: 20  Temp: (!) 97.5 F (36.4 C)  SpO2: 98%      Lab orders placed from triage:  urine

## 2022-05-02 NOTE — MAU Provider Note (Signed)
History     CSN: [id number]  Arrival date and time: 05/02/22 1541   Event Date/Time   First Provider Initiated Contact with Patient 05/02/22 1624      Chief Complaint  Patient presents with   Nausea   Emesis   HPI  [name] is a 41 y.o. [id number] at 56w3dwho presents for evaluation of nausea and vomiting. Patient reports she is still struggling with vomiting and spitting. She reports she thinks the scopolamine patch gave her anxiety and the robinul made her mouth too dry to take. She is taking suppositories and zofran but zofran makes her constipated. She denies any pain.  She denies any vaginal bleeding, discharge, and leaking of fluid. Denies any constipation, diarrhea or any urinary complaints.   OB History     Gravida  5   Para  4   Term  4   Preterm  0   AB  0   Living  4      SAB  0   IAB  0   Ectopic  0   Multiple  0   Live Births  4           Past Medical History:  Diagnosis Date   Abnormal Pap smear    Anemia    Asthma    Depression    Fibroid    GDM (gestational diabetes mellitus)    H/O varicella    HSV (herpes simplex virus) anogenital infection    Hx of bacterial infection    Hyperemesis    During first pregnancy   Hypertension    Sinusitis    Trichomonas 02/2011   treated   Type 2 diabetes mellitus complicating pregnancy, antepartum    Urinary tract infection    Vaginal Pap smear, abnormal     Past Surgical History:  Procedure Laterality Date   BREAST SURGERY     NO PAST SURGERIES      Family History  Problem Relation Age of Onset   Diabetes Mother    Hypertension Mother    Peripheral vascular disease Mother    Thyroid disease Mother    Asthma Mother    Diabetes Father    Hypertension Father    Seizures Maternal Aunt    Alcohol abuse Maternal Aunt    Heart attack Maternal Uncle    Kidney disease Maternal Uncle    Cancer Maternal Uncle        prostate   Thyroid disease Paternal Aunt    Heart attack  Paternal Uncle    Kidney disease Paternal Uncle    Anesthesia problems Neg Hx    Hypotension Neg Hx    Malignant hyperthermia Neg Hx    Pseudochol deficiency Neg Hx     Social History   Tobacco Use   Smoking status: Never   Smokeless tobacco: Never  Vaping Use   Vaping Use: Never used  Substance Use Topics   Alcohol use: No   Drug use: No    Allergies:  Allergies  Allergen Reactions   Flagyl [Metronidazole Hcl] Swelling   Latex Hives   Other Other (See Comments)    Pt has a reaction to plastic tape. She breaks out with a rash and her skin peels.    Medications Prior to Admission  Medication Sig Dispense Refill Last Dose   FLUoxetine (PROZAC) 20 MG capsule Take 20 mg by mouth every morning.   05/02/2022   ondansetron (ZOFRAN-ODT) 4 MG disintegrating tablet Take 4 mg by mouth  every 8 (eight) hours as needed for nausea or vomiting.   05/02/2022 at 1200   Prenatal Vit-Fe Fumarate-FA (PRENATAL MULTIVITAMIN) TABS tablet Take 1 tablet by mouth daily.    05/02/2022   acetaminophen (TYLENOL) 325 MG tablet Take 650 mg by mouth every 6 (six) hours as needed for mild pain or headache.      clotrimazole-betamethasone (LOTRISONE) cream Apply 1 application topically in the morning and at bedtime.      Doxylamine-Pyridoxine 10-10 MG TBEC Take 2 tablets by mouth at bedtime as needed. (Patient not taking: Reported on 01/24/2020) 60 tablet 0    metFORMIN (GLUCOPHAGE-XR) 500 MG 24 hr tablet Take 1 tablet (500 mg total) by mouth daily with breakfast. 30 tablet 2    metoCLOPramide (REGLAN) 10 MG tablet Take 1 tablet (10 mg total) by mouth every 6 (six) hours. 30 tablet 0    NIFEdipine (ADALAT CC) 60 MG 24 hr tablet Take 1 tablet (60 mg total) by mouth daily. 30 tablet 2    promethazine (PHENERGAN) 12.5 MG tablet Take 12.5 mg by mouth every 6 (six) hours as needed for nausea or vomiting.      promethazine (PHENERGAN) 25 MG suppository Place 1 suppository (25 mg total) rectally every 6 (six) hours as  needed for nausea or vomiting. 12 each 0    scopolamine (TRANSDERM-SCOP) 1 MG/3DAYS Place 1 patch (1.5 mg total) onto the skin every 3 (three) days. 10 patch 12     Review of Systems  Constitutional: Negative.  Negative for fatigue and fever.  HENT: Negative.    Respiratory: Negative.  Negative for shortness of breath.   Cardiovascular: Negative.  Negative for chest pain.  Gastrointestinal:  Positive for nausea and vomiting. Negative for abdominal pain, constipation and diarrhea.  Genitourinary: Negative.  Negative for dysuria, vaginal bleeding and vaginal discharge.  Neurological: Negative.  Negative for dizziness and headaches.  Psychiatric/Behavioral:  The patient is nervous/anxious.    Physical Exam   Blood pressure (!) 146/89, pulse 84, temperature (!) 97.5 F (36.4 C), temperature source Oral, resp. rate 20, height '5\' 3"'$  (1.6 m), weight 127.3 kg, SpO2 98 %, unknown if currently breastfeeding.  Patient Vitals for the past 24 hrs:  BP Temp Temp src Pulse Resp SpO2 Height Weight  05/02/22 1600 (!) 146/89 (!) 97.5 F (36.4 C) Oral 84 20 98 % '5\' 3"'$  (1.6 m) 127.3 kg    Physical Exam Vitals and nursing note reviewed.  Constitutional:      General: She is not in acute distress.    Appearance: She is well-developed.  HENT:     Head: Normocephalic.  Eyes:     Pupils: Pupils are equal, round, and reactive to light.  Cardiovascular:     Rate and Rhythm: Normal rate and regular rhythm.     Heart sounds: Normal heart sounds.  Pulmonary:     Effort: Pulmonary effort is normal. No respiratory distress.     Breath sounds: Normal breath sounds.  Abdominal:     General: Bowel sounds are normal. There is no distension.     Palpations: Abdomen is soft.     Tenderness: There is no abdominal tenderness.  Skin:    General: Skin is warm and dry.  Neurological:     Mental Status: She is alert and oriented to person, place, and time.  Psychiatric:        Mood and Affect: Mood normal.         Behavior: Behavior normal.  Thought Content: Thought content normal.        Judgment: Judgment normal.     FHT: 133 bpm   MAU Course  Procedures  Results for orders placed or performed during the hospital encounter of 05/02/22 (from the past 24 hour(s))  Urinalysis, Routine w reflex microscopic     Status: Abnormal   Collection Time: 05/02/22  6:53 PM  Result Value Ref Range   Color, Urine YELLOW YELLOW   APPearance HAZY (A) CLEAR   Specific Gravity, Urine 1.029 1.005 - 1.030   pH 6.0 5.0 - 8.0   Glucose, UA NEGATIVE NEGATIVE mg/dL   Hgb urine dipstick NEGATIVE NEGATIVE   Bilirubin Urine NEGATIVE NEGATIVE   Ketones, ur NEGATIVE NEGATIVE mg/dL   Protein, ur 30 (A) NEGATIVE mg/dL   Nitrite NEGATIVE NEGATIVE   Leukocytes,Ua NEGATIVE NEGATIVE   RBC / HPF 0-5 0 - 5 RBC/hpf   WBC, UA 0-5 0 - 5 WBC/hpf   Bacteria, UA RARE (A) NONE SEEN   Squamous Epithelial / LPF 6-10 0 - 5   Mucus PRESENT     MDM Prenatal records from community office not on file. Pregnancy complicated by hyperemesis. Labs ordered and reviewed.   UA LR Bolus Pepcid Compazine  IV team consult  After starting IV, patient reports her anxiety is too bad and she is leaving. She has not gotten any IV fluids yet. She does not want to stay for further treatment.   CNM offered treatment for anxiety and patient declined Patient left unit before a plan of care could be established  Assessment and Plan   1. Nausea and vomiting during pregnancy   2. [redacted] weeks gestation of pregnancy   3. Left against medical advice     -Patient left [place], CNM 05/02/2022, 4:24 PM

## 2022-05-02 NOTE — MAU Note (Signed)
Patient asked RN to stop medications and remove her IV so that she could go home due to her anxiety. Patient signed AMA form and verbalized understanding. AMA form placed in medical records bin.

## 2022-05-03 LAB — OB RESULTS CONSOLE GC/CHLAMYDIA
Chlamydia: NEGATIVE
Neisseria Gonorrhea: NEGATIVE

## 2022-06-10 ENCOUNTER — Other Ambulatory Visit: Payer: Self-pay

## 2022-06-25 ENCOUNTER — Other Ambulatory Visit: Payer: Self-pay | Admitting: Obstetrics and Gynecology

## 2022-06-25 DIAGNOSIS — Z3A21 21 weeks gestation of pregnancy: Secondary | ICD-10-CM

## 2022-06-25 DIAGNOSIS — Z363 Encounter for antenatal screening for malformations: Secondary | ICD-10-CM

## 2022-06-25 DIAGNOSIS — R638 Other symptoms and signs concerning food and fluid intake: Secondary | ICD-10-CM

## 2022-06-25 DIAGNOSIS — O10912 Unspecified pre-existing hypertension complicating pregnancy, second trimester: Secondary | ICD-10-CM

## 2022-06-25 DIAGNOSIS — O09891 Supervision of other high risk pregnancies, first trimester: Secondary | ICD-10-CM

## 2022-06-25 DIAGNOSIS — O09522 Supervision of elderly multigravida, second trimester: Secondary | ICD-10-CM

## 2022-06-25 DIAGNOSIS — O24112 Pre-existing diabetes mellitus, type 2, in pregnancy, second trimester: Secondary | ICD-10-CM

## 2022-06-26 ENCOUNTER — Ambulatory Visit: Payer: Medicaid Other

## 2022-06-26 ENCOUNTER — Other Ambulatory Visit: Payer: Self-pay

## 2022-07-10 ENCOUNTER — Encounter: Payer: Self-pay | Admitting: Skilled Nursing Facility1

## 2022-07-10 ENCOUNTER — Encounter: Payer: Medicaid Other | Attending: Obstetrics and Gynecology | Admitting: Skilled Nursing Facility1

## 2022-07-10 VITALS — Ht 63.0 in | Wt 285.6 lb

## 2022-07-10 DIAGNOSIS — O24119 Pre-existing diabetes mellitus, type 2, in pregnancy, unspecified trimester: Secondary | ICD-10-CM | POA: Insufficient documentation

## 2022-07-10 NOTE — Progress Notes (Signed)
Pt is about 22 weeks a long   Pt state she has had diabetes for about 4 years.  Pt states this preganancy has been tired, sleepy, out of breath and hot a lot which is typical for pregnancies typically needing to stay in the H. Pt states she is out of work due to her limitations during pregnancy. Pt states currently she has not been vomiting but does have nausea taking antinausea daily.  Pt states she does have anxiety.  Pt states she is taking a prenatal vitamin and vitamin D  Pt states she has not had DM education in 4 years.   Pt states her sleep routine is way off from being uncomfortable.  Pt states she does forget to check her blood sugar often.  Pt states she brushes multiple times a day and flosses daily.  Pt states she eats 3 cups of peaches a day packed in syrup/juice.   Pt states her husband makes all of the meals.  Pt states she tries to cut off her eating at 9-10pm but does struggle with dinner cravings in the night. Pt states she drinks about 6 16.9 ounces water a day due to dry mouth.   Other Dx: Anemia Asthma Depression DM HTN Sleep apnea  Diabetes Self-Management Education  Visit Type: First/Initial   07/11/2022  Ms. Arletha Grippe, identified by name and date of birth, is a 41 y.o. female with a diagnosis of Diabetes: Type 2.   ASSESSMENT  Height '5\' 3"'$  (1.6 m), weight 285 lb 9.6 oz (129.5 kg), unknown if currently breastfeeding. Body mass index is 50.59 kg/m.   Diabetes Self-Management Education - 07/10/22 1507       Visit Information   Visit Type First/Initial      Initial Visit   Diabetes Type Type 2    Date Diagnosed 4 years ago    Are you currently following a meal plan? No    Are you taking your medications as prescribed? Not on Medications      Health Coping   How would you rate your overall health? Fair      Psychosocial Assessment   Patient Belief/Attitude about Diabetes Denial    What is the hardest part about your diabetes right now,  causing you the most concern, or is the most worrisome to you about your diabetes?   Checking blood sugar;Being active    Self-management support Family    Patient Concerns Healthy Lifestyle;Glycemic Control    Special Needs Simplified materials    Preferred Learning Style Visual    Learning Readiness Contemplating    How often do you need to have someone help you when you read instructions, pamphlets, or other written materials from your doctor or pharmacy? 3 - Sometimes      Pre-Education Assessment   Patient understands the diabetes disease and treatment process. Needs Instruction    Patient understands incorporating nutritional management into lifestyle. Needs Instruction    Patient undertands incorporating physical activity into lifestyle. Needs Instruction    Patient understands using medications safely. Needs Instruction    Patient understands monitoring blood glucose, interpreting and using results Needs Instruction    Patient understands prevention, detection, and treatment of acute complications. Needs Instruction    Patient understands prevention, detection, and treatment of chronic complications. Needs Instruction    Patient understands how to develop strategies to address psychosocial issues. Needs Instruction    Patient understands how to develop strategies to promote health/change behavior. Needs Instruction  Complications   Last HgB A1C per patient/outside source 6.8 %    How often do you check your blood sugar? 0 times/day (not testing)    Postprandial Blood glucose range (mg/dL) 70-129    Have you had a dilated eye exam in the past 12 months? Yes    Have you had a dental exam in the past 12 months? No    Are you checking your feet? Yes    How many days per week are you checking your feet? 7      Dietary Intake   Breakfast skipped    Snack (morning) cup of peaches    Lunch [place]    Dinner pizza or hamburger helper + corn + biscuit or pasta or drummets +  mashed potatoes + grene beans    Snack (evening) oddles of noodles + cheese + hot sauce    Beverage(s) sweet tea, water, orange juice      Activity / Exercise   Activity / Exercise Type ADL's      Patient Education   Previous Diabetes Education No    Disease Pathophysiology Definition of diabetes, type 1 and 2, and the diagnosis of diabetes;Factors that contribute to the development of diabetes    Healthy Eating Meal options for control of blood glucose level and chronic complications.;Information on hints to eating out and maintain blood glucose control.;Effects of alcohol on blood glucose and safety factors with consumption of alcohol.;Carbohydrate counting;Plate Method;Food label reading, portion sizes and measuring food.;Role of diet in the treatment of diabetes and the relationship between the three main macronutrients and blood glucose level    Being Active Role of exercise on diabetes management, blood pressure control and cardiac health.    Monitoring Daily foot exams;Yearly dilated eye exam;Purpose and frequency of SMBG.;Taught/evaluated SMBG meter.    Acute complications Taught prevention, symptoms, and  treatment of hypoglycemia - the 15 rule.;Discussed and identified patients' prevention, symptoms, and treatment of hyperglycemia.    Chronic complications Dental care;Retinopathy and reason for yearly dilated eye exams;Nephropathy, what it is, prevention of, the use of ACE, ARB's and early detection of through urine microalbumia.;Assessed and discussed foot care and prevention of foot problems    Diabetes Stress and Support Role of stress on diabetes;Helped patient identify a support system for diabetes management;Worked with patient to identify barriers to care and solutions    Preconception care Pregnancy and GDM  Role of pre-pregnancy blood glucose control on the development of the fetus;Reviewed with patient blood glucose goals with pregnancy      Individualized Goals (developed by  patient)   Nutrition Follow meal plan discussed;General guidelines for healthy choices and portions discussed    Physical Activity Exercise 5-7 days per week;45 minutes per day    Monitoring  Test my blood glucose as discussed    Problem Solving Social situations    Reducing Risk do foot checks daily;treat hypoglycemia with 15 grams of carbs if blood glucose less than '70mg'$ /dL      Post-Education Assessment   Patient understands the diabetes disease and treatment process. Demonstrates understanding / competency    Patient understands incorporating nutritional management into lifestyle. Demonstrates understanding / competency    Patient undertands incorporating physical activity into lifestyle. Demonstrates understanding / competency    Patient understands using medications safely. Demonstrates understanding / competency    Patient understands monitoring blood glucose, interpreting and using results Demonstrates understanding / competency    Patient understands prevention, detection, and treatment of acute complications. Demonstrates understanding /  competency    Patient understands prevention, detection, and treatment of chronic complications. Demonstrates understanding / competency    Patient understands how to develop strategies to address psychosocial issues. Demonstrates understanding / competency    Patient understands how to develop strategies to promote health/change behavior. Demonstrates understanding / competency      Outcomes   Expected Outcomes Demonstrated interest in learning but significant barriers to change    Future DMSE 4-6 wks    Program Status Completed             Individualized Plan for Diabetes Self-Management Training:   Learning Objective:  Patient will have a greater understanding of diabetes self-management. Patient education plan is to attend individual and/or group sessions per assessed needs and concerns.    Expected Outcomes:  Demonstrated interest in  learning but significant barriers to change  Education material provided: ADA - How to Thrive: A Guide for Your Journey with Diabetes, Food label handouts, Meal plan card, My Plate, and Snack sheet  If problems or questions, patient to contact team via:  Phone and Email  Future DSME appointment: 4-6 wks

## 2022-08-01 ENCOUNTER — Ambulatory Visit: Payer: Medicaid Other | Admitting: Skilled Nursing Facility1

## 2022-08-06 ENCOUNTER — Ambulatory Visit: Payer: Medicaid Other

## 2022-08-09 ENCOUNTER — Other Ambulatory Visit: Payer: Self-pay | Admitting: *Deleted

## 2022-08-09 ENCOUNTER — Ambulatory Visit (HOSPITAL_BASED_OUTPATIENT_CLINIC_OR_DEPARTMENT_OTHER): Payer: No Typology Code available for payment source | Admitting: Obstetrics

## 2022-08-09 ENCOUNTER — Ambulatory Visit (HOSPITAL_BASED_OUTPATIENT_CLINIC_OR_DEPARTMENT_OTHER): Payer: No Typology Code available for payment source

## 2022-08-09 ENCOUNTER — Ambulatory Visit: Payer: No Typology Code available for payment source | Attending: Obstetrics and Gynecology | Admitting: *Deleted

## 2022-08-09 VITALS — BP 123/81 | HR 106

## 2022-08-09 DIAGNOSIS — O10012 Pre-existing essential hypertension complicating pregnancy, second trimester: Secondary | ICD-10-CM | POA: Diagnosis not present

## 2022-08-09 DIAGNOSIS — D259 Leiomyoma of uterus, unspecified: Secondary | ICD-10-CM | POA: Insufficient documentation

## 2022-08-09 DIAGNOSIS — O10912 Unspecified pre-existing hypertension complicating pregnancy, second trimester: Secondary | ICD-10-CM | POA: Diagnosis not present

## 2022-08-09 DIAGNOSIS — Z363 Encounter for antenatal screening for malformations: Secondary | ICD-10-CM | POA: Diagnosis not present

## 2022-08-09 DIAGNOSIS — O24112 Pre-existing diabetes mellitus, type 2, in pregnancy, second trimester: Secondary | ICD-10-CM

## 2022-08-09 DIAGNOSIS — R638 Other symptoms and signs concerning food and fluid intake: Secondary | ICD-10-CM | POA: Diagnosis not present

## 2022-08-09 DIAGNOSIS — O99212 Obesity complicating pregnancy, second trimester: Secondary | ICD-10-CM | POA: Diagnosis not present

## 2022-08-09 DIAGNOSIS — O09522 Supervision of elderly multigravida, second trimester: Secondary | ICD-10-CM | POA: Insufficient documentation

## 2022-08-09 DIAGNOSIS — Z3A21 21 weeks gestation of pregnancy: Secondary | ICD-10-CM

## 2022-08-09 DIAGNOSIS — O10019 Pre-existing essential hypertension complicating pregnancy, unspecified trimester: Secondary | ICD-10-CM

## 2022-08-09 DIAGNOSIS — E119 Type 2 diabetes mellitus without complications: Secondary | ICD-10-CM | POA: Diagnosis not present

## 2022-08-09 DIAGNOSIS — Z3A27 27 weeks gestation of pregnancy: Secondary | ICD-10-CM | POA: Insufficient documentation

## 2022-08-09 DIAGNOSIS — O3412 Maternal care for benign tumor of corpus uteri, second trimester: Secondary | ICD-10-CM | POA: Insufficient documentation

## 2022-08-09 DIAGNOSIS — E669 Obesity, unspecified: Secondary | ICD-10-CM | POA: Insufficient documentation

## 2022-08-09 DIAGNOSIS — O24319 Unspecified pre-existing diabetes mellitus in pregnancy, unspecified trimester: Secondary | ICD-10-CM | POA: Diagnosis not present

## 2022-08-09 DIAGNOSIS — O09891 Supervision of other high risk pregnancies, first trimester: Secondary | ICD-10-CM

## 2022-08-09 NOTE — Progress Notes (Signed)
[phone]  

## 2022-08-09 NOTE — Progress Notes (Signed)
MFM Note  [name] was seen for a detailed fetal anatomy scan due to advanced maternal age (41 years old) maternal obesity with a BMI of 50, chronic hypertension, and pregestational diabetes.  The patient reports that she discontinued Procardia earlier in her pregnancy.  Her blood pressure today was 123/81.    She was diagnosed with type 2 diabetes many years ago.  She had been treated with metformin 500 mg twice a day.  She has stopped taking the metformin.  She reports 4 prior vaginal deliveries at around 37 weeks.  She had a cell free DNA test earlier in her pregnancy which indicated a low risk for trisomy 55, 78, and 13. A female fetus is predicted.   She was informed that the fetal growth and amniotic fluid level were appropriate for her gestational age.   The views of the fetal anatomy were limited today due to the fetal position and maternal body habitus.  The patient was informed that anomalies may be missed due to technical limitations. If the fetus is in a suboptimal position or maternal habitus is increased, visualization of the fetus in the maternal uterus may be impaired.  A large 13 cm fibroid was noted on the left side of her uterus.  The following were discussed during our consultation today:  Advanced maternal age in pregnancy  The increased risk of fetal aneuploidy due to advanced maternal age was discussed.   Due to advanced maternal age, the patient was offered and declined an amniocentesis today for definitive diagnosis of fetal aneuploidy.  She is comfortable with her negative cell free DNA test.  Pregestational diabetes and pregnancy  The implications and management of diabetes in pregnancy was discussed in detail with the patient.    She was advised to continue to monitor her fingersticks 4 times daily (fasting and 2 hours after each meal).    She was advised that our goals for her fingerstick values are fasting values of 90-95 or less and two-hour  postprandial values of 120 or less.    Should the majority of her fingerstick results be above these values, she may have to be resumed on metformin or she may have to be switched to insulin to help her achieve better glycemic control. The patient was advised that getting her fingerstick values as close to these goals as possible would provide her with the most optimal obstetrical outcome.  The increased risk of polyhydramnios, fetal macrosomia, and preeclampsia associated with diabetes was also discussed.    Chronic hypertension in pregnancy  The implications and management of chronic hypertension in pregnancy was discussed.    Her blood pressure today was 123/81.  The patient was advised that should her blood pressures be elevated later in pregnancy, she may have to be restarted on Procardia for blood pressure control.    The increased risk of superimposed preeclampsia, an indicated preterm delivery, and possible fetal growth restriction due to chronic hypertension in pregnancy was discussed.  As she is already close to 28 weeks, it is questionable if starting a daily baby aspirin would help in prevention of preeclampsia.  She was advised to start taking a daily baby aspirin (81 mg daily) if she wants to as it would not do any harm.  Obesity in pregnancy  As maternal obesity may present challenges associated with the management of anesthesia, an anesthesia consult should be obtained when she is admitted for delivery.  Fibroids and pregnancy  The increased risk of maternal pain issues and  fetal growth issues due to the fibroids was discussed.   Due to her underlying medical conditions, we will continue to follow her with monthly growth ultrasounds.    We will start weekly fetal testing at 32 weeks.   Delivery should probably occur at around 37 weeks.  A follow-up growth scan and BPP was scheduled in 4 weeks.    The patient stated that all of her questions have been answered to her  satisfaction.   A total of 45 minutes was spent counseling and coordinating the care for this patient.  Greater than 50% of the time was spent in direct face-to-face contact.

## 2022-08-13 ENCOUNTER — Other Ambulatory Visit: Payer: Self-pay | Admitting: *Deleted

## 2022-08-13 DIAGNOSIS — D259 Leiomyoma of uterus, unspecified: Secondary | ICD-10-CM

## 2022-08-13 DIAGNOSIS — O10919 Unspecified pre-existing hypertension complicating pregnancy, unspecified trimester: Secondary | ICD-10-CM

## 2022-08-13 DIAGNOSIS — O09523 Supervision of elderly multigravida, third trimester: Secondary | ICD-10-CM

## 2022-08-13 DIAGNOSIS — O24113 Pre-existing diabetes mellitus, type 2, in pregnancy, third trimester: Secondary | ICD-10-CM

## 2022-08-13 DIAGNOSIS — O99213 Obesity complicating pregnancy, third trimester: Secondary | ICD-10-CM

## 2022-09-04 ENCOUNTER — Ambulatory Visit: Payer: No Typology Code available for payment source | Attending: Obstetrics

## 2022-09-04 ENCOUNTER — Encounter (HOSPITAL_COMMUNITY): Payer: Self-pay | Admitting: Obstetrics and Gynecology

## 2022-09-04 ENCOUNTER — Encounter: Payer: Self-pay | Admitting: *Deleted

## 2022-09-04 ENCOUNTER — Inpatient Hospital Stay (HOSPITAL_COMMUNITY)
Admission: AD | Admit: 2022-09-04 | Discharge: 2022-09-12 | DRG: 784 | Discharge status: Against Medical Advice | Payer: No Typology Code available for payment source | Attending: Obstetrics and Gynecology | Admitting: Obstetrics and Gynecology

## 2022-09-04 ENCOUNTER — Other Ambulatory Visit: Payer: Self-pay

## 2022-09-04 ENCOUNTER — Ambulatory Visit: Payer: No Typology Code available for payment source | Attending: Obstetrics | Admitting: Obstetrics

## 2022-09-04 ENCOUNTER — Ambulatory Visit: Payer: No Typology Code available for payment source | Admitting: *Deleted

## 2022-09-04 DIAGNOSIS — Z794 Long term (current) use of insulin: Secondary | ICD-10-CM | POA: Diagnosis not present

## 2022-09-04 DIAGNOSIS — O1493 Unspecified pre-eclampsia, third trimester: Secondary | ICD-10-CM | POA: Diagnosis not present

## 2022-09-04 DIAGNOSIS — O1002 Pre-existing essential hypertension complicating childbirth: Secondary | ICD-10-CM | POA: Diagnosis present

## 2022-09-04 DIAGNOSIS — D259 Leiomyoma of uterus, unspecified: Secondary | ICD-10-CM | POA: Diagnosis present

## 2022-09-04 DIAGNOSIS — O368330 Maternal care for abnormalities of the fetal heart rate or rhythm, third trimester, not applicable or unspecified: Secondary | ICD-10-CM | POA: Diagnosis present

## 2022-09-04 DIAGNOSIS — O9952 Diseases of the respiratory system complicating childbirth: Secondary | ICD-10-CM | POA: Diagnosis not present

## 2022-09-04 DIAGNOSIS — O98313 Other infections with a predominantly sexual mode of transmission complicating pregnancy, third trimester: Secondary | ICD-10-CM | POA: Diagnosis present

## 2022-09-04 DIAGNOSIS — O99213 Obesity complicating pregnancy, third trimester: Secondary | ICD-10-CM

## 2022-09-04 DIAGNOSIS — O114 Pre-existing hypertension with pre-eclampsia, complicating childbirth: Principal | ICD-10-CM | POA: Diagnosis present

## 2022-09-04 DIAGNOSIS — O24112 Pre-existing diabetes mellitus, type 2, in pregnancy, second trimester: Secondary | ICD-10-CM | POA: Insufficient documentation

## 2022-09-04 DIAGNOSIS — Z3A34 34 weeks gestation of pregnancy: Secondary | ICD-10-CM | POA: Diagnosis not present

## 2022-09-04 DIAGNOSIS — O3413 Maternal care for benign tumor of corpus uteri, third trimester: Secondary | ICD-10-CM | POA: Diagnosis present

## 2022-09-04 DIAGNOSIS — Z7984 Long term (current) use of oral hypoglycemic drugs: Secondary | ICD-10-CM | POA: Diagnosis not present

## 2022-09-04 DIAGNOSIS — Z3A32 32 weeks gestation of pregnancy: Secondary | ICD-10-CM | POA: Diagnosis not present

## 2022-09-04 DIAGNOSIS — F419 Anxiety disorder, unspecified: Secondary | ICD-10-CM | POA: Diagnosis present

## 2022-09-04 DIAGNOSIS — Z91199 Patient's noncompliance with other medical treatment and regimen due to unspecified reason: Secondary | ICD-10-CM

## 2022-09-04 DIAGNOSIS — O99214 Obesity complicating childbirth: Secondary | ICD-10-CM | POA: Diagnosis present

## 2022-09-04 DIAGNOSIS — E119 Type 2 diabetes mellitus without complications: Secondary | ICD-10-CM

## 2022-09-04 DIAGNOSIS — Z3A31 31 weeks gestation of pregnancy: Secondary | ICD-10-CM

## 2022-09-04 DIAGNOSIS — O2412 Pre-existing diabetes mellitus, type 2, in childbirth: Secondary | ICD-10-CM | POA: Diagnosis present

## 2022-09-04 DIAGNOSIS — O1490 Unspecified pre-eclampsia, unspecified trimester: Principal | ICD-10-CM | POA: Diagnosis present

## 2022-09-04 DIAGNOSIS — O403XX0 Polyhydramnios, third trimester, not applicable or unspecified: Secondary | ICD-10-CM | POA: Diagnosis present

## 2022-09-04 DIAGNOSIS — O24113 Pre-existing diabetes mellitus, type 2, in pregnancy, third trimester: Secondary | ICD-10-CM

## 2022-09-04 DIAGNOSIS — O1414 Severe pre-eclampsia complicating childbirth: Secondary | ICD-10-CM | POA: Diagnosis not present

## 2022-09-04 DIAGNOSIS — O09523 Supervision of elderly multigravida, third trimester: Secondary | ICD-10-CM

## 2022-09-04 DIAGNOSIS — F32A Depression, unspecified: Secondary | ICD-10-CM | POA: Diagnosis present

## 2022-09-04 DIAGNOSIS — O09522 Supervision of elderly multigravida, second trimester: Secondary | ICD-10-CM | POA: Insufficient documentation

## 2022-09-04 DIAGNOSIS — Z5329 Procedure and treatment not carried out because of patient's decision for other reasons: Secondary | ICD-10-CM | POA: Diagnosis present

## 2022-09-04 DIAGNOSIS — O99212 Obesity complicating pregnancy, second trimester: Secondary | ICD-10-CM | POA: Diagnosis present

## 2022-09-04 DIAGNOSIS — O321XX0 Maternal care for breech presentation, not applicable or unspecified: Secondary | ICD-10-CM | POA: Diagnosis present

## 2022-09-04 DIAGNOSIS — O10013 Pre-existing essential hypertension complicating pregnancy, third trimester: Secondary | ICD-10-CM

## 2022-09-04 DIAGNOSIS — O10913 Unspecified pre-existing hypertension complicating pregnancy, third trimester: Secondary | ICD-10-CM | POA: Diagnosis not present

## 2022-09-04 DIAGNOSIS — O9832 Other infections with a predominantly sexual mode of transmission complicating childbirth: Secondary | ICD-10-CM | POA: Diagnosis present

## 2022-09-04 DIAGNOSIS — O113 Pre-existing hypertension with pre-eclampsia, third trimester: Secondary | ICD-10-CM | POA: Diagnosis not present

## 2022-09-04 DIAGNOSIS — Z302 Encounter for sterilization: Secondary | ICD-10-CM | POA: Diagnosis not present

## 2022-09-04 DIAGNOSIS — O10912 Unspecified pre-existing hypertension complicating pregnancy, second trimester: Secondary | ICD-10-CM | POA: Diagnosis present

## 2022-09-04 DIAGNOSIS — A6000 Herpesviral infection of urogenital system, unspecified: Secondary | ICD-10-CM | POA: Diagnosis present

## 2022-09-04 DIAGNOSIS — E669 Obesity, unspecified: Secondary | ICD-10-CM | POA: Diagnosis not present

## 2022-09-04 DIAGNOSIS — O99343 Other mental disorders complicating pregnancy, third trimester: Secondary | ICD-10-CM | POA: Diagnosis present

## 2022-09-04 DIAGNOSIS — O320XX1 Maternal care for unstable lie, fetus 1: Secondary | ICD-10-CM | POA: Diagnosis not present

## 2022-09-04 DIAGNOSIS — Z91040 Latex allergy status: Secondary | ICD-10-CM

## 2022-09-04 DIAGNOSIS — Z3A33 33 weeks gestation of pregnancy: Secondary | ICD-10-CM

## 2022-09-04 DIAGNOSIS — O164 Unspecified maternal hypertension, complicating childbirth: Secondary | ICD-10-CM | POA: Diagnosis not present

## 2022-09-04 DIAGNOSIS — Z79899 Other long term (current) drug therapy: Secondary | ICD-10-CM | POA: Diagnosis not present

## 2022-09-04 DIAGNOSIS — Z3689 Encounter for other specified antenatal screening: Secondary | ICD-10-CM | POA: Diagnosis not present

## 2022-09-04 DIAGNOSIS — O99344 Other mental disorders complicating childbirth: Secondary | ICD-10-CM | POA: Diagnosis present

## 2022-09-04 DIAGNOSIS — J45909 Unspecified asthma, uncomplicated: Secondary | ICD-10-CM | POA: Diagnosis not present

## 2022-09-04 DIAGNOSIS — O119 Pre-existing hypertension with pre-eclampsia, unspecified trimester: Secondary | ICD-10-CM | POA: Diagnosis present

## 2022-09-04 LAB — COMPREHENSIVE METABOLIC PANEL
ALT: 16 U/L (ref 0–44)
AST: 25 U/L (ref 15–41)
Albumin: 2.8 g/dL — ABNORMAL LOW (ref 3.5–5.0)
Alkaline Phosphatase: 50 U/L (ref 38–126)
Anion gap: 8 (ref 5–15)
BUN: 7 mg/dL (ref 6–20)
CO2: 21 mmol/L — ABNORMAL LOW (ref 22–32)
Calcium: 8.9 mg/dL (ref 8.9–10.3)
Chloride: 102 mmol/L (ref 98–111)
Creatinine, Ser: 0.65 mg/dL (ref 0.44–1.00)
GFR, Estimated: >60 mL/min (ref >60)
Glucose, Bld: 148 mg/dL — ABNORMAL HIGH (ref 70–99)
Potassium: 3.4 mmol/L — ABNORMAL LOW (ref 3.5–5.1)
Sodium: 131 mmol/L — ABNORMAL LOW (ref 135–145)
Total Bilirubin: 0.3 mg/dL (ref 0.3–1.2)
Total Protein: 6.3 g/dL — ABNORMAL LOW (ref 6.5–8.1)

## 2022-09-04 LAB — CBC
HCT: 31.9 % — ABNORMAL LOW (ref 36.0–46.0)
Hemoglobin: 10.9 g/dL — ABNORMAL LOW (ref 12.0–15.0)
MCH: 29.1 pg (ref 26.0–34.0)
MCHC: 34.2 g/dL (ref 30.0–36.0)
MCV: 85.1 fL (ref 80.0–100.0)
Platelets: 277 10*3/uL (ref 150–400)
RBC: 3.75 MIL/uL — ABNORMAL LOW (ref 3.87–5.11)
RDW: 11.9 % (ref 11.5–15.5)
WBC: 4.6 10*3/uL (ref 4.0–10.5)
nRBC: 0 % (ref 0.0–0.2)

## 2022-09-04 LAB — URINALYSIS, ROUTINE W REFLEX MICROSCOPIC
Bilirubin Urine: NEGATIVE
Glucose, UA: 50 mg/dL — AB
Hgb urine dipstick: NEGATIVE
Ketones, ur: 20 mg/dL — AB
Nitrite: NEGATIVE
Protein, ur: 100 mg/dL — AB
Specific Gravity, Urine: 1.028 (ref 1.005–1.030)
pH: 5 (ref 5.0–8.0)

## 2022-09-04 LAB — TYPE AND SCREEN
ABO/RH(D): A POS
Antibody Screen: NEGATIVE

## 2022-09-04 LAB — GLUCOSE, CAPILLARY
Glucose-Capillary: 179 mg/dL — ABNORMAL HIGH (ref 70–99)
Glucose-Capillary: 163 mg/dL — ABNORMAL HIGH (ref 70–99)

## 2022-09-04 LAB — LACTATE DEHYDROGENASE: LDH: 136 U/L (ref 98–192)

## 2022-09-04 MED ORDER — PRENATAL MULTIVITAMIN CH
1.0000 | ORAL_TABLET | Freq: Every day | ORAL | Status: DC
  Administered 2022-09-05 – 2022-09-11 (×7): 1 via ORAL
  Filled 2022-09-04 (×7): qty 1

## 2022-09-04 MED ORDER — LABETALOL HCL 5 MG/ML IV SOLN
40.0000 mg | INTRAVENOUS | Status: DC | PRN
  Administered 2022-09-04: 40 mg via INTRAVENOUS
  Filled 2022-09-04: qty 8

## 2022-09-04 MED ORDER — LABETALOL HCL 5 MG/ML IV SOLN: 80.0000 mg | INTRAVENOUS | Status: DC | PRN

## 2022-09-04 MED ORDER — LABETALOL HCL 5 MG/ML IV SOLN: 40.0000 mg | INTRAVENOUS | Status: DC | PRN

## 2022-09-04 MED ORDER — BUTALBITAL-APAP-CAFFEINE 50-325-40 MG PO TABS: 1.0000 | ORAL_TABLET | Freq: Four times a day (QID) | ORAL | Status: DC | PRN

## 2022-09-04 MED ORDER — VALACYCLOVIR HCL 500 MG PO TABS
500.0000 mg | ORAL_TABLET | Freq: Two times a day (BID) | ORAL | Status: DC
  Administered 2022-09-04 – 2022-09-11 (×15): 500 mg via ORAL
  Filled 2022-09-04 (×15): qty 1

## 2022-09-04 MED ORDER — HYDRALAZINE HCL 20 MG/ML IJ SOLN: 10.0000 mg | INTRAMUSCULAR | Status: DC | PRN

## 2022-09-04 MED ORDER — LABETALOL HCL 5 MG/ML IV SOLN
20.0000 mg | INTRAVENOUS | Status: DC | PRN
  Administered 2022-09-04: 20 mg via INTRAVENOUS
  Filled 2022-09-04: qty 4

## 2022-09-04 MED ORDER — ACETAMINOPHEN 325 MG PO TABS: 650.0000 mg | ORAL_TABLET | ORAL | Status: DC | PRN

## 2022-09-04 MED ORDER — PROMETHAZINE HCL 25 MG RE SUPP
25.0000 mg | Freq: Four times a day (QID) | RECTAL | Status: DC | PRN
  Administered 2022-09-04 – 2022-09-07 (×4): 25 mg via RECTAL
  Filled 2022-09-04 (×6): qty 1

## 2022-09-04 MED ORDER — ZOLPIDEM TARTRATE 5 MG PO TABS
5.0000 mg | ORAL_TABLET | Freq: Every evening | ORAL | Status: DC | PRN
  Filled 2022-09-04: qty 1

## 2022-09-04 MED ORDER — ACETAMINOPHEN 325 MG PO TABS: 650.0000 mg | ORAL_TABLET | Freq: Four times a day (QID) | ORAL | Status: DC | PRN

## 2022-09-04 MED ORDER — FLUOXETINE HCL 20 MG PO CAPS
20.0000 mg | ORAL_CAPSULE | Freq: Every morning | ORAL | Status: DC
  Administered 2022-09-05: 20 mg via ORAL
  Filled 2022-09-04: qty 1

## 2022-09-04 MED ORDER — PRENATAL MULTIVITAMIN CH: 1.0000 | ORAL_TABLET | Freq: Every day | ORAL | Status: DC

## 2022-09-04 MED ORDER — LABETALOL HCL 200 MG PO TABS
200.0000 mg | ORAL_TABLET | Freq: Two times a day (BID) | ORAL | Status: DC
  Administered 2022-09-04 – 2022-09-06 (×4): 200 mg via ORAL
  Filled 2022-09-04 (×4): qty 1

## 2022-09-04 MED ORDER — DOXYLAMINE SUCCINATE (SLEEP) 25 MG PO TABS
25.0000 mg | ORAL_TABLET | Freq: Every evening | ORAL | Status: DC | PRN
  Administered 2022-09-04 – 2022-09-11 (×8): 25 mg via ORAL
  Filled 2022-09-04 (×9): qty 1

## 2022-09-04 MED ORDER — LACTATED RINGERS IV SOLN: INTRAVENOUS | Status: DC

## 2022-09-04 MED ORDER — LACTATED RINGERS IV SOLN: 125.0000 mL/h | INTRAVENOUS | Status: AC

## 2022-09-04 MED ORDER — CALCIUM CARBONATE ANTACID 500 MG PO CHEW
2.0000 | CHEWABLE_TABLET | ORAL | Status: DC | PRN
  Administered 2022-09-07 – 2022-09-11 (×4): 400 mg via ORAL
  Filled 2022-09-04 (×4): qty 2

## 2022-09-04 MED ORDER — INSULIN ASPART 100 UNIT/ML IJ SOLN
0.0000 [IU] | Freq: Three times a day (TID) | INTRAMUSCULAR | Status: DC
  Administered 2022-09-04 – 2022-09-05 (×2): 3 [IU] via SUBCUTANEOUS

## 2022-09-04 MED ORDER — ACETAMINOPHEN-CAFFEINE 500-65 MG PO TABS
2.0000 | ORAL_TABLET | Freq: Once | ORAL | Status: AC
  Administered 2022-09-04: 2 via ORAL
  Filled 2022-09-04: qty 2

## 2022-09-04 MED ORDER — HYDROXYZINE HCL 50 MG PO TABS: 25.0000 mg | ORAL_TABLET | Freq: Three times a day (TID) | ORAL | Status: DC | PRN

## 2022-09-04 MED ORDER — DOCUSATE SODIUM 100 MG PO CAPS
100.0000 mg | ORAL_CAPSULE | Freq: Every day | ORAL | Status: DC
  Administered 2022-09-04 – 2022-09-11 (×8): 100 mg via ORAL
  Filled 2022-09-04 (×8): qty 1

## 2022-09-04 MED ORDER — BETAMETHASONE SOD PHOS & ACET 6 (3-3) MG/ML IJ SUSP
12.0000 mg | INTRAMUSCULAR | Status: AC
  Administered 2022-09-04 – 2022-09-05 (×2): 12 mg via INTRAMUSCULAR
  Filled 2022-09-04: qty 5

## 2022-09-04 MED ORDER — MAGNESIUM SULFATE BOLUS VIA INFUSION
4.0000 g | Freq: Once | INTRAVENOUS | Status: AC
  Administered 2022-09-04: 4 g via INTRAVENOUS
  Filled 2022-09-04: qty 1000

## 2022-09-04 MED ORDER — MAGNESIUM SULFATE 40 GM/1000ML IV SOLN
2.0000 g/h | INTRAVENOUS | Status: AC
  Filled 2022-09-04 (×2): qty 1000

## 2022-09-04 MED ORDER — LORATADINE 10 MG PO TABS
10.0000 mg | ORAL_TABLET | Freq: Every day | ORAL | Status: DC
  Administered 2022-09-04 – 2022-09-11 (×8): 10 mg via ORAL
  Filled 2022-09-04 (×8): qty 1

## 2022-09-04 NOTE — Progress Notes (Signed)
MFM Note  [name] was seen for a follow up growth scan and BPP due to advanced maternal age (42 years old), chronic hypertension that is not treated with any medications, and pregestational diabetes that is treated with metformin.  She reports that her hemoglobin A1c drawn last week was 7.1%, indicating suboptimal glycemic control.  The patient's blood pressures were extremely elevated today 171/102, 172/101, and 146/105. She has been complaining of a mild headache since yesterday.  The overall EFW of 4 pounds 5 ounces measures at the 77th percentile today.    There was normal amniotic fluid noted with a total AFI of 20.6 cm.    The fetus is in the breech presentation.    A BPP performed today was 8 out of 8.  Due to her extremely elevated blood pressures and her headache, the patient was sent to the MAU following today's ultrasound exam for a preeclampsia evaluation.  The patient understands that should she be diagnosed with preeclampsia, she may have to receive a course of antenatal corticosteroids and possibly may require inpatient management until delivery.    She understands that delivery is the only treatment for preeclampsia.  Should preeclampsia be ruled out, she may need to be restarted on Procardia for blood pressure control.  Should preeclampsia be ruled out and she is discharged home, she will return to our office in 1 week for another BPP.  The patient stated that all of her questions were answered today.  A total of 20 minutes was spent counseling and coordinating the care for this patient.  Greater than 50% of the time was spent in direct face-to-face contact.

## 2022-09-04 NOTE — MAU Note (Signed)
.  [name] is a 42 y.o. at 38w1dhere in MAU reporting: HBP in office 170s/100s per pt. And 10/10 HA. Pt. Reports positive FM, denies LOF or VB.  [place]: [place] 1/3 AM Pain score: 10/10 HA Vitals:   09/04/22 1405 09/04/22 1406  BP:  (!) 150/102  Pulse:  (!) 117  Resp:  20  Temp:  (!) 97.3 F (36.3 C)  SpO2: 99%      FHT:150s Lab orders placed from triage:   UA

## 2022-09-04 NOTE — MAU Provider Note (Signed)
History     CSN: [id number]  Arrival date and time: 09/04/22 1344   Event Date/Time   First Provider Initiated Contact with Patient 09/04/22 1426      Chief Complaint  Patient presents with   Headache   Hypertension   42 y.o. [id number] '@31'$ .1 wks w/CHTN presenting from MFM office for severe blood pressures. Pt reports frontal HA since yesterday. Rates pain 10/10. Has not treated it. Denies visual disturbances, RUQ pain, SOB, and CP.      OB History     Gravida  5   Para  4   Term  4   Preterm  0   AB  0   Living  4      SAB  0   IAB  0   Ectopic  0   Multiple  0   Live Births  4           Past Medical History:  Diagnosis Date   Abnormal Pap smear    Anemia    Asthma    Depression    Fibroid    GDM (gestational diabetes mellitus)    H/O varicella    HSV (herpes simplex virus) anogenital infection    Hx of bacterial infection    Hyperemesis    During first pregnancy   Hypertension    Sinusitis    Trichomonas 02/2011   treated   Type 2 diabetes mellitus complicating pregnancy, antepartum    Urinary tract infection    Vaginal Pap smear, abnormal     Past Surgical History:  Procedure Laterality Date   BREAST SURGERY     NO PAST SURGERIES      Family History  Problem Relation Age of Onset   Diabetes Mother    Hypertension Mother    Peripheral vascular disease Mother    Thyroid disease Mother    Asthma Mother    Diabetes Father    Hypertension Father    Seizures Maternal Aunt    Alcohol abuse Maternal Aunt    Heart attack Maternal Uncle    Kidney disease Maternal Uncle    Cancer Maternal Uncle        prostate   Thyroid disease Paternal Aunt    Heart attack Paternal Uncle    Kidney disease Paternal Uncle    Anesthesia problems Neg Hx    Hypotension Neg Hx    Malignant hyperthermia Neg Hx    Pseudochol deficiency Neg Hx     Social History   Tobacco Use   Smoking status: Never   Smokeless tobacco: Never  Vaping Use    Vaping Use: Never used  Substance Use Topics   Alcohol use: No   Drug use: No    Allergies:  Allergies  Allergen Reactions   Flagyl [Metronidazole Hcl] Swelling   Latex Hives   Other Other (See Comments)    Pt has a reaction to plastic tape. She breaks out with a rash and her skin peels.    Medications Prior to Admission  Medication Sig Dispense Refill Last Dose   Doxylamine-Pyridoxine 10-10 MG TBEC Take 2 tablets by mouth at bedtime as needed. 60 tablet 0 09/03/2022   FLUoxetine (PROZAC) 20 MG capsule Take 20 mg by mouth every morning.   09/03/2022   loratadine-pseudoephedrine (CLARITIN-D 12-HOUR) 5-120 MG tablet Take 1 tablet by mouth daily.   09/03/2022   ondansetron (ZOFRAN-ODT) 4 MG disintegrating tablet Take 4 mg by mouth every 8 (eight) hours as needed for nausea or  vomiting.   Past Week   Prenatal Vit-Fe Fumarate-FA (PRENATAL MULTIVITAMIN) TABS tablet Take 1 tablet by mouth daily.    09/03/2022   promethazine (PHENERGAN) 25 MG suppository Place 1 suppository (25 mg total) rectally every 6 (six) hours as needed for nausea or vomiting. 12 each 0 09/04/2022   acetaminophen (TYLENOL) 325 MG tablet Take 650 mg by mouth every 6 (six) hours as needed for mild pain or headache.   Unknown   metFORMIN (GLUCOPHAGE-XR) 500 MG 24 hr tablet Take 1 tablet (500 mg total) by mouth daily with breakfast. (Patient not taking: Reported on 08/09/2022) 30 tablet 2 Unknown   metoCLOPramide (REGLAN) 10 MG tablet Take 1 tablet (10 mg total) by mouth every 6 (six) hours. 30 tablet 0 Unknown   NIFEdipine (ADALAT CC) 60 MG 24 hr tablet Take 1 tablet (60 mg total) by mouth daily. 30 tablet 2    promethazine (PHENERGAN) 12.5 MG tablet Take 12.5 mg by mouth every 6 (six) hours as needed for nausea or vomiting.   Unknown    Review of Systems  Eyes:  Negative for visual disturbance.  Respiratory:  Negative for shortness of breath.   Cardiovascular:  Negative for chest pain.  Gastrointestinal:  Negative for abdominal  pain.  Neurological:  Positive for headaches.  Psychiatric/Behavioral:  The patient is nervous/anxious.    Physical Exam   Blood pressure (!) 151/106, pulse 100, temperature 98.3 F (36.8 C), temperature source Oral, resp. rate 20, height '5\' 3"'$  (1.6 m), weight 129.7 kg, SpO2 99 %, unknown if currently breastfeeding. Patient Vitals for the past 24 hrs:  BP Temp Temp src Pulse Resp SpO2 Height Weight  09/04/22 1851 (!) 151/106 -- -- 100 20 -- -- --  09/04/22 1806 (!) 155/91 -- -- 84 20 -- -- --  09/04/22 1756 (!) 164/99 -- -- 86 19 -- -- --  09/04/22 1747 (!) 161/103 -- -- 77 20 -- -- --  09/04/22 1725 (!) 163/117 98.3 F (36.8 C) Oral 85 20 99 % -- --  09/04/22 1630 122/72 -- -- 79 -- 97 % -- --  09/04/22 1616 (!) 135/91 -- -- 82 -- -- -- --  09/04/22 1600 (!) 156/109 -- -- 91 -- 99 % -- --  09/04/22 1546 (!) 132/95 -- -- 87 -- -- -- --  09/04/22 1538 (!) 142/90 -- -- 94 -- -- -- --  09/04/22 1516 (!) 157/118 -- -- (!) 113 -- 98 % -- --  09/04/22 1500 (!) 162/108 -- -- (!) 104 -- 97 % -- --  09/04/22 1445 (!) 158/107 -- -- (!) 114 -- 98 % -- --  09/04/22 1430 (!) 150/106 -- -- (!) 116 -- 97 % -- --  09/04/22 1415 (!) 149/97 -- -- (!) 120 18 98 % -- --  09/04/22 1406 (!) 150/102 (!) 97.3 F (36.3 C) Oral (!) 117 20 -- '5\' 3"'$  (1.6 m) 129.7 kg  09/04/22 1405 -- -- -- -- -- 99 % -- --    Physical Exam Vitals and nursing note reviewed.  Constitutional:      General: She is not in acute distress.    Appearance: Normal appearance.  HENT:     Head: Normocephalic and atraumatic.  Cardiovascular:     Rate and Rhythm: Normal rate.  Pulmonary:     Effort: Pulmonary effort is normal. No respiratory distress.  Musculoskeletal:        General: Normal range of motion.     Cervical back: Normal range  of motion.  Skin:    General: Skin is warm and dry.  Neurological:     General: No focal deficit present.     Mental Status: She is alert and oriented to person, place, and time.   Psychiatric:        Mood and Affect: Mood is anxious.   EFM: 155 bpm, mod variability, + accels, no decels Toco: UI  Results for orders placed or performed during the hospital encounter of 09/04/22 (from the past 24 hour(s))  Urinalysis, Routine w reflex microscopic Urine, Clean Catch     Status: Abnormal   Collection Time: 09/04/22  1:52 PM  Result Value Ref Range   Color, Urine YELLOW YELLOW   APPearance HAZY (A) CLEAR   Specific Gravity, Urine 1.028 1.005 - 1.030   pH 5.0 5.0 - 8.0   Glucose, UA 50 (A) NEGATIVE mg/dL   Hgb urine dipstick NEGATIVE NEGATIVE   Bilirubin Urine NEGATIVE NEGATIVE   Ketones, ur 20 (A) NEGATIVE mg/dL   Protein, ur 100 (A) NEGATIVE mg/dL   Nitrite NEGATIVE NEGATIVE   Leukocytes,Ua TRACE (A) NEGATIVE   RBC / HPF 0-5 0 - 5 RBC/hpf   WBC, UA 6-10 0 - 5 WBC/hpf   Bacteria, UA FEW (A) NONE SEEN   Squamous Epithelial / HPF 11-20 0 - 5 /HPF   Mucus PRESENT   Comprehensive metabolic panel     Status: Abnormal   Collection Time: 09/04/22  2:11 PM  Result Value Ref Range   Sodium 131 (L) 135 - 145 mmol/L   Potassium 3.4 (L) 3.5 - 5.1 mmol/L   Chloride 102 98 - 111 mmol/L   CO2 21 (L) 22 - 32 mmol/L   Glucose, Bld 148 (H) 70 - 99 mg/dL   BUN 7 6 - 20 mg/dL   Creatinine, Ser 0.65 0.44 - 1.00 mg/dL   Calcium 8.9 8.9 - 10.3 mg/dL   Total Protein 6.3 (L) 6.5 - 8.1 g/dL   Albumin 2.8 (L) 3.5 - 5.0 g/dL   AST 25 15 - 41 U/L   ALT 16 0 - 44 U/L   Alkaline Phosphatase 50 38 - 126 U/L   Total Bilirubin 0.3 0.3 - 1.2 mg/dL   GFR, Estimated >60 >60 mL/min   Anion gap 8 5 - 15  CBC     Status: Abnormal   Collection Time: 09/04/22  2:11 PM  Result Value Ref Range   WBC 4.6 4.0 - 10.5 K/uL   RBC 3.75 (L) 3.87 - 5.11 MIL/uL   Hemoglobin 10.9 (L) 12.0 - 15.0 g/dL   HCT 31.9 (L) 36.0 - 46.0 %   MCV 85.1 80.0 - 100.0 fL   MCH 29.1 26.0 - 34.0 pg   MCHC 34.2 30.0 - 36.0 g/dL   RDW 11.9 11.5 - 15.5 %   Platelets 277 150 - 400 K/uL   nRBC 0.0 0.0 - 0.2 %  Type  and screen [place]     Status: None   Collection Time: 09/04/22  2:11 PM  Result Value Ref Range   ABO/RH(D) A POS    Antibody Screen NEG    Sample Expiration      09/07/2022,2359 Performed at [facility], [address], [place], [place] [address]   Lactate dehydrogenase     Status: None   Collection Time: 09/04/22  4:42 PM  Result Value Ref Range   LDH 136 98 - 192 U/L   MAU Course  Procedures Excedrin  Labetalol  MDM Prenatal records reviewed, pregnancy complicated by obesity, AMA, CHTN (no meds), and T2DM (no meds).  Labs ordered and reviewed.  1630: Dr. Ivin Poot notified of presentation, clinical findings, and rec for admit, agrees with plan. Assessment and Plan  [redacted] weeks gestation Reactive NST CHTN w/si PEC, severe Admit to [facility] unit M[facility] per MD   Julianne Handler, CNM 09/04/2022, 6:53 PM

## 2022-09-04 NOTE — H&P (Addendum)
[name] is a 42 y.o. female [phone] at 31w1dpresenting for chronic hypertension with superimposed pre-eclampsia w/ SF due to severe range BP requiring IV antihypertensives (20 mg IV labetalol)  Pregnancy complicated by: Pre-existing MD type 2 - on metformin prior to pregnancy, non compliant with CBG, last Hgb A1c: 7.1 on 08/22/2022 Chronic hypertension - non compliant with procardia 60 XL qd and aspirin 81 mg qd Fibroid uterus HSV 1 & 2 AMA Depression - on fluoxetine Breech presentation  OB History     Gravida  5   Para  4   Term  4   Preterm  0   AB  0   Living  4      SAB  0   IAB  0   Ectopic  0   Multiple  0   Live Births  4          Past Medical History:  Diagnosis Date   Abnormal Pap smear    Anemia    Asthma    Depression    Fibroid    GDM (gestational diabetes mellitus)    H/O varicella    HSV (herpes simplex virus) anogenital infection    Hx of bacterial infection    Hyperemesis    During first pregnancy   Hypertension    Sinusitis    Trichomonas 02/2011   treated   Type 2 diabetes mellitus complicating pregnancy, antepartum    Urinary tract infection    Vaginal Pap smear, abnormal    Past Surgical History:  Procedure Laterality Date   BREAST SURGERY     NO PAST SURGERIES     Family History: family history includes Alcohol abuse in her maternal aunt; Asthma in her mother; Cancer in her maternal uncle; Diabetes in her father and mother; Heart attack in her maternal uncle and paternal uncle; Hypertension in her father and mother; Kidney disease in her maternal uncle and paternal uncle; Peripheral vascular disease in her mother; Seizures in her maternal aunt; Thyroid disease in her mother and paternal aunt. Social History:  reports that she has never smoked. She has never used smokeless tobacco. She reports that she does not drink alcohol and does not use drugs.     Maternal Diabetes: Yes:  Diabetes Type:  Insulin/Medication  controlled Genetic Screening: Normal Maternal Ultrasounds/Referrals: Normal Fetal Ultrasounds or other Referrals:  Referred to Materal Fetal Medicine  Maternal Substance Abuse:  No Significant Maternal Medications:  Meds include: Other: metformin, fluoxetine, procardia (noncompliant), aspirin (noncompliant) Significant Maternal Lab Results:  None Number of Prenatal Visits:greater than 3 verified prenatal visits Other Comments:  None  Review of Systems  Constitutional: Negative.   HENT: Negative.         Headache  Eyes: Negative.   Respiratory: Negative.    Cardiovascular: Negative.   Gastrointestinal: Negative.   Genitourinary: Negative.    Maternal Medical History:  Fetal activity: Perceived fetal activity is normal.   Prenatal complications: PIH and pre-eclampsia.   Prenatal Complications - Diabetes: type 2. Diabetes is managed by oral agent (monotherapy).       Blood pressure (!) 157/118, pulse (!) 113, temperature (!) 97.3 F (36.3 C), temperature source Oral, resp. rate 18, height '5\' 3"'$  (1.6 m), weight 129.7 kg, SpO2 98 %, unknown if currently breastfeeding. Maternal Exam:  Abdomen: Patient reports no abdominal tenderness. Estimated fetal weight is 4lbs 5oz per MFM [place].   Fetal presentation: breech   Fetal Exam Fetal Monitor Review: Baseline rate: 150.  Variability: moderate (6-25 bpm).   Pattern: accelerations present and variable decelerations.     Physical Exam Vitals reviewed.  Cardiovascular:     Rate and Rhythm: Normal rate and regular rhythm.  Pulmonary:     Effort: Pulmonary effort is normal. No respiratory distress.     Breath sounds: Normal breath sounds.  Abdominal:     General: There is no distension.     Palpations: Abdomen is soft.     Tenderness: There is no abdominal tenderness.  Skin:    General: Skin is warm and dry.  Neurological:     Mental Status: She is alert.     Deep Tendon Reflexes: Reflexes normal.  Psychiatric:        Mood  and Affect: Mood is anxious.        Behavior: Behavior normal.     Prenatal labs: ABO, Rh:  A pos Antibody:   neg Rubella:   immune RPR:   neg HBsAg:   neg HIV:   neg GBS:   pending  MFM [place]: 09/04/2022: Breech presentation, anterior fundal placenta, FHR: 132bpm, AFI: 20.6 cm, EFW: 4lbs, 5oz (77%), BPP: 8/8  Results for orders placed or performed during the hospital encounter of 09/04/22 (from the past 24 hour(s))  Urinalysis, Routine w reflex microscopic Urine, Clean Catch     Status: Abnormal   Collection Time: 09/04/22  1:52 PM  Result Value Ref Range   Color, Urine YELLOW YELLOW   APPearance HAZY (A) CLEAR   Specific Gravity, Urine 1.028 1.005 - 1.030   pH 5.0 5.0 - 8.0   Glucose, UA 50 (A) NEGATIVE mg/dL   Hgb urine dipstick NEGATIVE NEGATIVE   Bilirubin Urine NEGATIVE NEGATIVE   Ketones, ur 20 (A) NEGATIVE mg/dL   Protein, ur 100 (A) NEGATIVE mg/dL   Nitrite NEGATIVE NEGATIVE   Leukocytes,Ua TRACE (A) NEGATIVE   RBC / HPF 0-5 0 - 5 RBC/hpf   WBC, UA 6-10 0 - 5 WBC/hpf   Bacteria, UA FEW (A) NONE SEEN   Squamous Epithelial / LPF 11-20 0 - 5 /HPF   Mucus PRESENT   Comprehensive metabolic panel     Status: Abnormal   Collection Time: 09/04/22  2:11 PM  Result Value Ref Range   Sodium 131 (L) 135 - 145 mmol/L   Potassium 3.4 (L) 3.5 - 5.1 mmol/L   Chloride 102 98 - 111 mmol/L   CO2 21 (L) 22 - 32 mmol/L   Glucose, Bld 148 (H) 70 - 99 mg/dL   BUN 7 6 - 20 mg/dL   Creatinine, Ser 0.65 0.44 - 1.00 mg/dL   Calcium 8.9 8.9 - 10.3 mg/dL   Total Protein 6.3 (L) 6.5 - 8.1 g/dL   Albumin 2.8 (L) 3.5 - 5.0 g/dL   AST 25 15 - 41 U/L   ALT 16 0 - 44 U/L   Alkaline Phosphatase 50 38 - 126 U/L   Total Bilirubin 0.3 0.3 - 1.2 mg/dL   GFR, Estimated >60 >60 mL/min   Anion gap 8 5 - 15  CBC     Status: Abnormal   Collection Time: 09/04/22  2:11 PM  Result Value Ref Range   WBC 4.6 4.0 - 10.5 K/uL   RBC 3.75 (L) 3.87 - 5.11 MIL/uL   Hemoglobin 10.9 (L) 12.0 - 15.0 g/dL   HCT  31.9 (L) 36.0 - 46.0 %   MCV 85.1 80.0 - 100.0 fL   MCH 29.1 26.0 - 34.0 pg   MCHC  34.2 30.0 - 36.0 g/dL   RDW 11.9 11.5 - 15.5 %   Platelets 277 150 - 400 K/uL   nRBC 0.0 0.0 - 0.2 %    Assessment/Plan: 42 y.o. female G5P4004 at 72w1dChronic hypertension with superimposed pre-eclampsia w/ SF due to severe range BP requiring IV antihypertensives PIH labs wnl, PCR pending control BP start labetalol 200 mg BID, titrate PRN MgSO4 therapy for seizure prophylaxis x24hr BMZ course for fetal lung maturity GBS to be collected Inpatient management until delivery Anticipate delivery at 334wga or sooner if worsening maternal or fetal status NICU consult T&S, PIH labs q72hr CEFM/TOCO while on MgSO4 Weekly BPP, next 09/11/2022 Pre-existing MD type 2  on metformin prior to pregnancy, non compliant with CBG, last hgb A1c: 7.1 on 08/22/2022 Check fasting and 2hr PP CBGs, cover w/ SSI Diabetic diet Chronic hypertension  non compliant with procardia 60 XL qd and aspirin 81 mg qd Fibroid uterus Left 12.5 x 9.7 x 13 cm 08/09/2022, previously posterior 10.6 x 8.0 x 9.0 cm 04/18/2022 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

## 2022-09-05 DIAGNOSIS — O1493 Unspecified pre-eclampsia, third trimester: Secondary | ICD-10-CM | POA: Diagnosis not present

## 2022-09-05 DIAGNOSIS — Z3A31 31 weeks gestation of pregnancy: Secondary | ICD-10-CM | POA: Diagnosis not present

## 2022-09-05 LAB — GLUCOSE, CAPILLARY
Glucose-Capillary: 137 mg/dL — ABNORMAL HIGH (ref 70–99)
Glucose-Capillary: 138 mg/dL — ABNORMAL HIGH (ref 70–99)
Glucose-Capillary: 172 mg/dL — ABNORMAL HIGH (ref 70–99)
Glucose-Capillary: 209 mg/dL — ABNORMAL HIGH (ref 70–99)
Glucose-Capillary: 192 mg/dL — ABNORMAL HIGH (ref 70–99)

## 2022-09-05 LAB — PROTEIN / CREATININE RATIO, URINE
Creatinine, Urine: 47 mg/dL
Protein Creatinine Ratio: 0.4 mg/mg{Cre} — ABNORMAL HIGH (ref 0.00–0.15)
Total Protein, Urine: 19 mg/dL

## 2022-09-05 MED ORDER — INSULIN ASPART 100 UNIT/ML IJ SOLN
0.0000 [IU] | Freq: Three times a day (TID) | INTRAMUSCULAR | Status: DC
  Administered 2022-09-05: 4 [IU] via SUBCUTANEOUS
  Administered 2022-09-05: 3 [IU] via SUBCUTANEOUS
  Administered 2022-09-06: 2 [IU] via SUBCUTANEOUS
  Administered 2022-09-06: 3 [IU] via SUBCUTANEOUS
  Administered 2022-09-06 – 2022-09-07 (×2): 2 [IU] via SUBCUTANEOUS
  Administered 2022-09-07 – 2022-09-09 (×5): 1 [IU] via SUBCUTANEOUS
  Administered 2022-09-09: 2 [IU] via SUBCUTANEOUS
  Administered 2022-09-09: 3 [IU] via SUBCUTANEOUS
  Administered 2022-09-10 (×2): 2 [IU] via SUBCUTANEOUS
  Administered 2022-09-10 – 2022-09-11 (×2): 1 [IU] via SUBCUTANEOUS
  Administered 2022-09-11: 2 [IU] via SUBCUTANEOUS
  Administered 2022-09-11: 3 [IU] via SUBCUTANEOUS

## 2022-09-05 MED ORDER — SERTRALINE HCL 50 MG PO TABS
50.0000 mg | ORAL_TABLET | Freq: Every day | ORAL | Status: DC
  Administered 2022-09-06 – 2022-09-11 (×6): 50 mg via ORAL
  Filled 2022-09-05 (×6): qty 1

## 2022-09-05 MED ORDER — METFORMIN HCL ER 500 MG PO TB24
500.0000 mg | ORAL_TABLET | Freq: Two times a day (BID) | ORAL | Status: DC
  Administered 2022-09-05 – 2022-09-11 (×13): 500 mg via ORAL
  Filled 2022-09-05 (×15): qty 1

## 2022-09-05 NOTE — Inpatient Diabetes Management (Signed)
Inpatient Diabetes Program Recommendations  Diabetes Treatment Program Recommendations  ADA Standards of Care Diabetes in Pregnancy Target Glucose Ranges:  Fasting: 70 - 95 mg/dL 1 hr postprandial: Less than '140mg'$ /dL (from first bite of meal) 2 hr postprandial: Less than 120 mg/dL (from first bite of meal)    Lab Results  Component Value Date   GLUCAP 138 (H) 09/05/2022   HGBA1C 6.3 (A) 01/24/2020    Review of Glycemic Control  Latest Reference Range & Units 09/04/22 22:09 09/05/22 05:26 09/05/22 08:54  Glucose-Capillary 70 - 99 mg/dL 163 (H) 137 (H) 138 (H)  (H): Data is abnormally high Diabetes history: GDM Outpatient Diabetes medications: Metformin 500 mg QD (NT)- Prior to Pregnancy Current orders for Inpatient glycemic control: Novolog 0-14 units TID BMZ x 1 (2)  Inpatient Diabetes Program Recommendations:    Consider adding Semglee 12 units QD.  Thanks, Bronson Curb, MSN, RNC-OB Diabetes Coordinator [phone] (8a-5p)

## 2022-09-05 NOTE — Consult Note (Addendum)
MFM Consult Note Patient Name: [name]  Patient MRN:   [id number]  Referring provider: Dr. Ivin Poot  Reason for Consult: preE w/ severe features at 31w   HPI: [name] is a 42 y.o. [id number] at 107w2dwas [facility] SF requiring IV antihypertensives (20 mg IV labetalol).    Since admission she has received magnesium sulfate for seizure prophylaxis in addition to betamethasone.  Her blood pressure has been elevated but reasonably well-controlled and she was started on labetalol 200 mg. Her glucose control is improving with adjustment in her insulin.  She has noted increased lower abdominal pain around her fibroid but it eases with tylenol. She has no other acute complaints today. She understands the plan to be admitted until delivery.    Review of Systems: A review of systems was performed and was negative except per HPI   Past Obstetrical History:  OB History  Gravida Para Term Preterm AB Living  '5 4 4 '$ 0 0 4  SAB IAB Ectopic Multiple Live Births  0 0 0 0 4    # Outcome Date GA Lbr Len/2nd Weight Sex Delivery Anes PTL Lv  5 Current           4 Term 12/21/19 324w0d1:45 / 00:10 3365 g F Vag-Spont None, Other  LIV  3 Term 04/23/14 3745w2d:00 / 02:58 3595 g M Vag-Spont EPI  LIV  2 Term 08/02/11 38w8w5d:16 / 00:01 3235 g M Vag-Spont EPI, Local  LIV     Birth Comments: He was born full-termvia normal vaginal delivery with no perinatal events. His birth weight was 7 lbs. 2 oz. He developed all his milestones on time.  1 Term 05/02/05 37w080w0d0 2948 g M Vag-Spont EPI N LIV     Birth Comments: gest diab- diet controlled     Past Gynecologic History:  Not discussed  Past Medical History:  Past Medical History:  Diagnosis Date   Abnormal Pap smear    Anemia    Asthma    Depression    Fibroid    GDM (gestational diabetes mellitus)    H/O varicella    HSV (herpes simplex virus) anogenital infection    Hx of bacterial infection     Hyperemesis    During first pregnancy   Hypertension    Sinusitis    Trichomonas 02/2011   treated   Type 2 diabetes mellitus complicating pregnancy, antepartum    Urinary tract infection    Vaginal Pap smear, abnormal        Past Surgical History:    Past Surgical History:  Procedure Laterality Date   BREAST SURGERY     NO PAST SURGERIES       Family History:   family history includes Alcohol abuse in her maternal aunt; Asthma in her mother; Cancer in her maternal uncle; Diabetes in her father and mother; Heart attack in her maternal uncle and paternal uncle; Hypertension in her father and mother; Kidney disease in her maternal uncle and paternal uncle; Peripheral vascular disease in her mother; Seizures in her maternal aunt; Thyroid disease in her mother and paternal aunt.    Social History:   Social History   Socioeconomic History   Marital status: Single    Spouse name: Not on file   Number of children: Not on file   Years of education: Not on file   Highest education level: Not on file  Occupational History   Not on file  Tobacco Use   Smoking status: Never   Smokeless tobacco: Never  Vaping Use   Vaping Use: Never used  Substance and Sexual Activity   Alcohol use: No   Drug use: No   Sexual activity: Not Currently    Birth control/protection: None    Comment: two weeks ago  Other Topics Concern   Not on file  Social History Narrative   Not on file   Social Determinants of Health   Financial Resource Strain: Not on file  Food Insecurity: No Food Insecurity (09/04/2022)   Hunger Vital Sign    Worried About Running Out of Food in the Last Year: Never true    Ran Out of Food in the Last Year: Never true  Transportation Needs: No Transportation Needs (09/04/2022)   PRAPARE - Hydrologist (Medical): No    Lack of Transportation (Non-Medical): No  Physical Activity: Not on file  Stress: Not on file  Social Connections: Not on file   Intimate Partner Violence: Not At Risk (09/04/2022)   Humiliation, Afraid, Rape, and Kick questionnaire    Fear of Current or Ex-Partner: No    Emotionally Abused: No    Physically Abused: No    Sexually Abused: No      Home Medications:   No current facility-administered medications on file prior to encounter.   Current Outpatient Medications on File Prior to Encounter  Medication Sig Dispense Refill   Doxylamine-Pyridoxine 10-10 MG TBEC Take 2 tablets by mouth at bedtime as needed. 60 tablet 0   FLUoxetine (PROZAC) 20 MG capsule Take 20 mg by mouth every morning.     loratadine-pseudoephedrine (CLARITIN-D 12-HOUR) 5-120 MG tablet Take 1 tablet by mouth daily.     ondansetron (ZOFRAN-ODT) 4 MG disintegrating tablet Take 4 mg by mouth every 8 (eight) hours as needed for nausea or vomiting.     Prenatal Vit-Fe Fumarate-FA (PRENATAL MULTIVITAMIN) TABS tablet Take 1 tablet by mouth daily.      promethazine (PHENERGAN) 25 MG suppository Place 1 suppository (25 mg total) rectally every 6 (six) hours as needed for nausea or vomiting. 12 each 0   acetaminophen (TYLENOL) 325 MG tablet Take 650 mg by mouth every 6 (six) hours as needed for mild pain or headache.     metFORMIN (GLUCOPHAGE-XR) 500 MG 24 hr tablet Take 1 tablet (500 mg total) by mouth daily with breakfast. (Patient not taking: Reported on 08/09/2022) 30 tablet 2   metoCLOPramide (REGLAN) 10 MG tablet Take 1 tablet (10 mg total) by mouth every 6 (six) hours. 30 tablet 0   NIFEdipine (ADALAT CC) 60 MG 24 hr tablet Take 1 tablet (60 mg total) by mouth daily. 30 tablet 2   promethazine (PHENERGAN) 12.5 MG tablet Take 12.5 mg by mouth every 6 (six) hours as needed for nausea or vomiting.        Allergies:   Allergies  Allergen Reactions   Flagyl [Metronidazole Hcl] Swelling   Latex Hives   Other Other (See Comments)    Pt has a reaction to plastic tape. She breaks out with a rash and her skin peels.     Physical Exam:   See intake  sheet for vitals Sitting comfortably on the sonogram table Nonlabored breathing Normal rate and rhythm Abdomen is nontender  Assessment  [name] is a 42 y.o. [id number] at 80w2dad[facility]  1. Chronic hypertension with superimposed pre-eclampsia w/ SF   2.  DM2  Recommendations   - I have counseled the patient extensively about her medical problems with all questions answered - Continue inpatient management - HELLP labs to be repeat daily or more frequent (q6-8h) if blood pressure becomes elevated - 2-3x daily NST  - S/p BMZ and NICU consult - Continue Labetalol with titration as needed, goal BP < 140/90 - Delivery at 34 weeks or sooner if indicated - If the patient has any of the following delivery should be considered: Nonreassuring fetal heart tracing not responsive to intrauterine resuscitation, severe persistent maternal headache despite antihypertensive and analgesic treatment with no other identifiable cause other than preeclampsia, difficult to control blood pressure despite maximum doses of 1-2 antihypertensive medications, maternal labs consistent with preeclampsia with severe features.   60 min of time was spent in chart review and direct patient counseling  Thank you for the opportunity to be involved with this patient's care. Please let us know if we can be of any further assistance.   Valeda Malm  MFM, [place]   09/05/2022  12:11 PM

## 2022-09-05 NOTE — Progress Notes (Addendum)
Patient ID: [name], female   DOB: [birth year], 42 y.o.   MRN: [id number] [name] is a 42 y.o. G5P4004 at 50w2dad[facility] with [facility].  Hospital Day No: 2  Subjective: Denies HA, visual changes or abdominal pain.  No LOF, VB or ctxs and reports good fetal movement.  Feels anxiety and depression and says fluoxetine wasn't helping anymore.  Was told she is "immune" to it.  Objective: BP 132/83 (BP Location: Left Wrist)   Pulse 93   Temp 98.2 F (36.8 C) (Oral)   Resp 18   Ht '5\' 3"'$  (1.6 m)   Wt 129.7 kg   LMP  (LMP Unknown) Comment: ? June  SpO2 100%   BMI 50.66 kg/m  I/O last 3 completed shifts: In: 814.6 [P.O.:320; I.V.:494.6] Out: 1400 [Urine:1400] Total I/O In: 1210 [P.O.:1210] Out: 900 [Urine:900]  Physical Exam:  Gen: alert and no physical distress Chest/Lungs: cta bilaterally  Heart/Pulse: RRR  Abdomen: soft, gravid, nontender, BX x4 quad Uterine fundus: soft, nontender Skin & Color: warm and dry  Neurological: AOx3, DTRs 1+ EXT: negative Homan's b/l, edema 1+  FHT:  FHR: 145 bpm, variability: moderate,  accelerations:   present,  decelerations:  none visualized (RN in room trying to get continuous tracing now) UC:   none SVE:    deferred  Labs: Lab Results  Component Value Date   WBC 4.6 09/04/2022   HGB 10.9 (L) 09/04/2022   HCT 31.9 (L) 09/04/2022   MCV 85.1 09/04/2022   PLT 277 09/04/2022    Assessment and Plan: has Latex allergy; Allergy or intolerance to drug--flagyl; Depression / anxiety; SVD (spontaneous vaginal delivery); Encounter for planned induction of labor; Chronic hypertension affecting pregnancy; BMI 45.0-49.9, adult ([place]; Postpartum care following vaginal delivery 4/20; Diabetes ([place]; Preeclampsia, third trimester; Chronic hypertension with superimposed pre-eclampsia; [redacted] weeks gestation of pregnancy; and NST (non-stress test) reactive on their problem list.  1. Chronic hypertension with superimposed  pre-eclampsia w/ SF.  Cont labetalol '200mg'$  BID and observe BPs. 2. DM2 - pt is on SSI and checking CBGs 4x/day.  Will add metformin '500mg'$  BID for elevated CBGs (137-172). 3.NST q shift per Dr. SUlanda Edison- change to wired monitors for NSTs d/t LOC.  Pt is agreeable.  Cat 1 tracing. 4.Anxiety and depression - d/c fluoxetine and start sertraline '50mg'$  daily.   MFM Recommendations   - I have counseled the patient extensively about her medical problems with all questions answered - Continue inpatient management - HELLP labs to be repeat daily or more frequent (q6-8h) if blood pressure becomes elevated - 2-3x daily NST  - S/p BMZ and NICU consult - Continue Labetalol with titration as needed, goal BP < 140/90 - Delivery at 34 weeks or sooner if indicated - If the patient has any of the following delivery should be considered: Nonreassuring fetal heart tracing not responsive to intrauterine resuscitation, severe persistent maternal headache despite antihypertensive and analgesic treatment with no other identifiable cause other than preeclampsia, difficult to control blood pressure despite maximum doses of 1-2 antihypertensive medications, maternal labs consistent with preeclampsia with severe features.   ADelice Lesch1/12/2022, 2:43 PM

## 2022-09-06 LAB — CULTURE, BETA STREP (GROUP B ONLY)

## 2022-09-06 LAB — GLUCOSE, CAPILLARY
Glucose-Capillary: 138 mg/dL — ABNORMAL HIGH (ref 70–99)
Glucose-Capillary: 126 mg/dL — ABNORMAL HIGH (ref 70–99)
Glucose-Capillary: 129 mg/dL — ABNORMAL HIGH (ref 70–99)
Glucose-Capillary: 145 mg/dL — ABNORMAL HIGH (ref 70–99)
Glucose-Capillary: 174 mg/dL — ABNORMAL HIGH (ref 70–99)
Glucose-Capillary: 160 mg/dL — ABNORMAL HIGH (ref 70–99)

## 2022-09-06 LAB — COMPREHENSIVE METABOLIC PANEL
ALT: 15 U/L (ref 0–44)
AST: 18 U/L (ref 15–41)
Albumin: 3.1 g/dL — ABNORMAL LOW (ref 3.5–5.0)
Alkaline Phosphatase: 48 U/L (ref 38–126)
Anion gap: 9 (ref 5–15)
BUN: 9 mg/dL (ref 6–20)
CO2: 20 mmol/L — ABNORMAL LOW (ref 22–32)
Calcium: 8.9 mg/dL (ref 8.9–10.3)
Chloride: 105 mmol/L (ref 98–111)
Creatinine, Ser: 0.75 mg/dL (ref 0.44–1.00)
GFR, Estimated: >60 mL/min (ref >60)
Glucose, Bld: 124 mg/dL — ABNORMAL HIGH (ref 70–99)
Potassium: 4.1 mmol/L (ref 3.5–5.1)
Sodium: 134 mmol/L — ABNORMAL LOW (ref 135–145)
Total Bilirubin: 0.4 mg/dL (ref 0.3–1.2)
Total Protein: 6.6 g/dL (ref 6.5–8.1)

## 2022-09-06 LAB — MAGNESIUM: Magnesium: 2.4 mg/dL (ref 1.7–2.4)

## 2022-09-06 LAB — CBC
HCT: 30.9 % — ABNORMAL LOW (ref 36.0–46.0)
Hemoglobin: 10.6 g/dL — ABNORMAL LOW (ref 12.0–15.0)
MCH: 29.9 pg (ref 26.0–34.0)
MCHC: 34.3 g/dL (ref 30.0–36.0)
MCV: 87 fL (ref 80.0–100.0)
Platelets: 278 10*3/uL (ref 150–400)
RBC: 3.55 MIL/uL — ABNORMAL LOW (ref 3.87–5.11)
RDW: 12 % (ref 11.5–15.5)
WBC: 9.5 10*3/uL (ref 4.0–10.5)
nRBC: 0.4 % — ABNORMAL HIGH (ref 0.0–0.2)

## 2022-09-06 LAB — LACTATE DEHYDROGENASE: LDH: 149 U/L (ref 98–192)

## 2022-09-06 MED ORDER — LABETALOL HCL 200 MG PO TABS
300.0000 mg | ORAL_TABLET | Freq: Three times a day (TID) | ORAL | Status: DC
  Administered 2022-09-06 – 2022-09-08 (×6): 300 mg via ORAL
  Filled 2022-09-06 (×6): qty 1

## 2022-09-06 MED ORDER — INSULIN GLARGINE-YFGN 100 UNIT/ML ~~LOC~~ SOLN
10.0000 [IU] | Freq: Every day | SUBCUTANEOUS | Status: DC
  Administered 2022-09-06 – 2022-09-09 (×4): 10 [IU] via SUBCUTANEOUS
  Filled 2022-09-06 (×6): qty 0.1

## 2022-09-06 NOTE — Progress Notes (Signed)
S: Doing well, reports feeling better since the magnesium was stopped. Happy about her very active baby and feeling so much movement. Pt informed RN that she neede to go to the bank and wnted to leave and return. I informed the pt that we do not allow passes for reasons of safety and liability. AMA discharge would be the option with readmission. Pt states she will mange her needs remotely and does not desire to leave AMA. Pt seems comfortable with the discussion.  Denies HA, visual changes, and RUQ pain.   O:    09/06/2022   12:13 PM 09/06/2022   11:25 AM 09/06/2022    9:20 AM  Vitals with BMI  Systolic 701 779 390  Diastolic 62 85 82  Pulse 87 85 72      Latest Ref Rng & Units 09/06/2022    6:36 AM 09/04/2022    2:11 PM 04/18/2022   10:31 PM  CBC  WBC 4.0 - 10.5 K/uL 9.5  4.6  6.9   Hemoglobin 12.0 - 15.0 g/dL 10.6  10.9  11.9   Hematocrit 36.0 - 46.0 % 30.9  31.9  35.0   Platelets 150 - 400 K/uL 278  277  273       Latest Ref Rng & Units 09/06/2022    6:36 AM 09/04/2022    2:11 PM 04/18/2022   10:31 PM  CMP  Glucose 70 - 99 mg/dL 124  148  154   BUN 6 - 20 mg/dL '9  7  12   '$ Creatinine 0.44 - 1.00 mg/dL 0.75  0.65  0.58   Sodium 135 - 145 mmol/L 134  131  136   Potassium 3.5 - 5.1 mmol/L 4.1  3.4  3.5   Chloride 98 - 111 mmol/L 105  102  104   CO2 22 - 32 mmol/L '20  21  22   '$ Calcium 8.9 - 10.3 mg/dL 8.9  8.9  9.2   Total Protein 6.5 - 8.1 g/dL 6.6  6.3  7.4   Total Bilirubin 0.3 - 1.2 mg/dL 0.4  0.3  0.6   Alkaline Phos 38 - 126 U/L 48  50  40   AST 15 - 41 U/L '18  25  21   '$ ALT 0 - 44 U/L '15  16  18     '$ Intake/Output Summary (Last 24 hours) at 09/06/2022 1235 Last data filed at 09/06/2022 1050 Gross per 24 hour  Intake 1920.99 ml  Output 3100 ml  Net -1179.01 ml   Phys exam: Gen: A/O x3, no distress Chest/Lungs: unlabored, cta bilaterally  Heart/Pulse: RRR  Abdomen: soft, gravid, nontender, BX x4 quad Neuro: DTRs 1+, clonus absent EXT: +1 non-pitting edema to lower  extrimities, neg for pain, tenderness, and cords  NST - interruptions in surveillance D/T active FM, RN present and adjusting monitor 145/ moderate variability/ accels present/ decels absent TOCO no ctx traced, palpated, or perceived by pt  A/P: (spontaneous vaginal delivery); Encounter for planned induction of labor; Chronic hypertension affecting pregnancy; BMI 45.0-49.9, adult ([place]); Postpartum care following vaginal delivery 4/20; Diabetes ([place]); Preeclampsia, third trimester; Chronic hypertension with superimposed pre-eclampsia; [redacted] weeks gestation of pregnancy; and NST (non-stress test) reactive on their problem list.   1. Chronic hypertension with superimposed pre-eclampsia w/ SF.  Cont labetalol '200mg'$  BID and observe BPs. 2. DM2 - pt is on SSI and checking CBGs 4x/day.  Will add metformin '500mg'$  BID for elevated CBGs (09/06/22 fasting 136)  Pharmacist recommended adding Semglee 10  units QD to Dr. Havery Moros. Pt agrees to therapy 3.NST q shift per Dr. Ulanda Edison - change to wired monitors for NSTs d/t LOC.  Pt is agreeable.  Cat 1 tracing. 4.Anxiety and depression - d/c fluoxetine and start sertraline '50mg'$  daily.   MFM Recommendations   - I have counseled the patient extensively about her medical problems with all questions answered - Continue inpatient management - HELLP labs to be repeat daily or more frequent (q6-8h) if blood pressure becomes elevated - 2-3x daily NST  - S/p BMZ and NICU consult - Continue Labetalol with titration as needed, goal BP < 140/90 - Delivery at 34 weeks or sooner if indicated - If the patient has any of the following delivery should be considered: Nonreassuring fetal heart tracing not responsive to intrauterine resuscitation, severe persistent maternal headache despite antihypertensive and analgesic treatment with no other identifiable cause other than preeclampsia, difficult to control blood pressure despite maximum doses of 1-2 antihypertensive medications,  maternal labs consistent with preeclampsia with severe features.     Burman Foster, DNP, CNM 09/06/2022 12:07 PM

## 2022-09-06 NOTE — Progress Notes (Signed)
BP values trending up after magnesium was discontinued. Plan of care to increase Labetalol from 200 mg BID to 300 mg TID by Dr. Havery Moros.

## 2022-09-06 NOTE — Progress Notes (Signed)
   09/06/22 1620  Spiritual Encounters  Type of Visit Initial;Attempt (pt unavailable)  Care provided to: Pt not available  Referral source Nurse (RN/NT/LPN)  Reason for visit Routine spiritual support   Chaplain responded to a spiritual consult for support. The patient was out of the room when I attempted to visit. I will follow up next week.   [facility]  [phone]

## 2022-09-07 LAB — CBC
HCT: 30 % — ABNORMAL LOW (ref 36.0–46.0)
Hemoglobin: 9.8 g/dL — ABNORMAL LOW (ref 12.0–15.0)
MCH: 29.2 pg (ref 26.0–34.0)
MCHC: 32.7 g/dL (ref 30.0–36.0)
MCV: 89.3 fL (ref 80.0–100.0)
Platelets: 254 10*3/uL (ref 150–400)
RBC: 3.36 MIL/uL — ABNORMAL LOW (ref 3.87–5.11)
RDW: 12.2 % (ref 11.5–15.5)
WBC: 9 10*3/uL (ref 4.0–10.5)
nRBC: 0.6 % — ABNORMAL HIGH (ref 0.0–0.2)

## 2022-09-07 LAB — COMPREHENSIVE METABOLIC PANEL
ALT: 17 U/L (ref 0–44)
AST: 21 U/L (ref 15–41)
Albumin: 2.9 g/dL — ABNORMAL LOW (ref 3.5–5.0)
Alkaline Phosphatase: 48 U/L (ref 38–126)
Anion gap: 10 (ref 5–15)
BUN: 11 mg/dL (ref 6–20)
CO2: 20 mmol/L — ABNORMAL LOW (ref 22–32)
Calcium: 8.3 mg/dL — ABNORMAL LOW (ref 8.9–10.3)
Chloride: 102 mmol/L (ref 98–111)
Creatinine, Ser: 0.64 mg/dL (ref 0.44–1.00)
GFR, Estimated: >60 mL/min (ref >60)
Glucose, Bld: 106 mg/dL — ABNORMAL HIGH (ref 70–99)
Potassium: 4 mmol/L (ref 3.5–5.1)
Sodium: 132 mmol/L — ABNORMAL LOW (ref 135–145)
Total Bilirubin: 0.4 mg/dL (ref 0.3–1.2)
Total Protein: 5.8 g/dL — ABNORMAL LOW (ref 6.5–8.1)

## 2022-09-07 LAB — GLUCOSE, CAPILLARY
Glucose-Capillary: 98 mg/dL (ref 70–99)
Glucose-Capillary: 85 mg/dL (ref 70–99)
Glucose-Capillary: 145 mg/dL — ABNORMAL HIGH (ref 70–99)
Glucose-Capillary: 113 mg/dL — ABNORMAL HIGH (ref 70–99)
Glucose-Capillary: 106 mg/dL — ABNORMAL HIGH (ref 70–99)

## 2022-09-07 LAB — TYPE AND SCREEN
ABO/RH(D): A POS
Antibody Screen: NEGATIVE

## 2022-09-07 LAB — LACTATE DEHYDROGENASE: LDH: 157 U/L (ref 98–192)

## 2022-09-07 MED ORDER — PROMETHAZINE HCL 25 MG RE SUPP
25.0000 mg | Freq: Every day | RECTAL | Status: DC
  Administered 2022-09-08 – 2022-09-12 (×5): 25 mg via RECTAL
  Filled 2022-09-07 (×6): qty 1

## 2022-09-07 NOTE — Progress Notes (Addendum)
S: Doing well, reports a restful night. Denies HA, visual changes, and RUQ pain. Did not perceive and changes related to starting Semglee for her blood glucose or the increase in Labetalol for her blood pressure. Fasting blood glucose has not been checked. Reports active FM and denies feeling ctx/cramping.  O:     09/07/2022    3:53 AM 09/07/2022   12:07 AM 09/06/2022    7:41 PM  Vitals with BMI  Systolic 177 939 030  Diastolic 87 72 83  Pulse 76 76 77   CBG (last 3)  Recent Labs    09/06/22 1612 09/06/22 2007 09/06/22 2205  GLUCAP 145* 174* 160*    Intake/Output Summary (Last 24 hours) at 09/07/2022 0656 Last data filed at 09/06/2022 2319 Gross per 24 hour  Intake 1675 ml  Output 860 ml  Net 815 ml  Phys exam: Gen: A/O x3, no distress Chest/Lungs: unlabored Heart/Pulse: RRR  Abdomen: soft, gravid, nontender Neuro: DTRs 1+, clonus absent EXT: +1 non-pitting edema to lower extrimities, neg for pain, tenderness, and cords  NST - 155/ moderate/ accels present/ decels absent TOCO - none traced  A/P 42 y.o. [id number] 35w4dCHTN with S/I pre-e with sever features   -Labetalol 300 mg PO TID    -S/P mag sulfate (dc'd 09/05/22 @ 1806) Type 2 DM    -fasting and 2 hr PP CBG    -SSI    -Semglee 10 units SQ QD    -Metformin 500 mf PO BID Fetal well-being    -NST Q shift    -reactive Anxiety depression    -Sertraline 50 mg PO daily  MFM Recommendations   - I have counseled the patient extensively about her medical problems with all questions answered - Continue inpatient management - HELLP labs to be repeat daily or more frequent (q6-8h) if blood pressure becomes elevated - 2-3x daily NST  - S/p BMZ and NICU consult - Continue Labetalol with titration as needed, goal BP < 140/90 - Delivery at 34 weeks or sooner if indicated - If the patient has any of the following delivery should be considered: Nonreassuring fetal heart tracing not responsive to intrauterine resuscitation, severe  persistent maternal headache despite antihypertensive and analgesic treatment with no other identifiable cause other than preeclampsia, difficult to control blood pressure despite maximum doses of 1-2 antihypertensive medications, maternal labs consistent with preeclampsia with severe features.   VBurman Foster DNP, CNM 09/07/2022 7:04 AM   Attending Attestation: Medical screening examination/treatment/procedure(s) were performed by non-physician practitioner and as supervising physician I was immediately available for consultation/collaboration. Patient seen at bedside. Patient is feeling nauseated and is requesting antiemetic which primary RN is obtaining (this is not a new problem, states nausea is normal for her in the morning). Agree with note as outlined above.  MDrema Dallas DO

## 2022-09-08 LAB — COMPREHENSIVE METABOLIC PANEL
ALT: 18 U/L (ref 0–44)
AST: 27 U/L (ref 15–41)
Albumin: 2.9 g/dL — ABNORMAL LOW (ref 3.5–5.0)
Alkaline Phosphatase: 46 U/L (ref 38–126)
Anion gap: 9 (ref 5–15)
BUN: 10 mg/dL (ref 6–20)
CO2: 21 mmol/L — ABNORMAL LOW (ref 22–32)
Calcium: 9.2 mg/dL (ref 8.9–10.3)
Chloride: 103 mmol/L (ref 98–111)
Creatinine, Ser: 0.7 mg/dL (ref 0.44–1.00)
GFR, Estimated: >60 mL/min (ref >60)
Glucose, Bld: 111 mg/dL — ABNORMAL HIGH (ref 70–99)
Potassium: 4.1 mmol/L (ref 3.5–5.1)
Sodium: 133 mmol/L — ABNORMAL LOW (ref 135–145)
Total Bilirubin: 0.5 mg/dL (ref 0.3–1.2)
Total Protein: 6.2 g/dL — ABNORMAL LOW (ref 6.5–8.1)

## 2022-09-08 LAB — CBC
HCT: 32.5 % — ABNORMAL LOW (ref 36.0–46.0)
Hemoglobin: 10.6 g/dL — ABNORMAL LOW (ref 12.0–15.0)
MCH: 29.3 pg (ref 26.0–34.0)
MCHC: 32.6 g/dL (ref 30.0–36.0)
MCV: 89.8 fL (ref 80.0–100.0)
Platelets: 257 10*3/uL (ref 150–400)
RBC: 3.62 MIL/uL — ABNORMAL LOW (ref 3.87–5.11)
RDW: 12.1 % (ref 11.5–15.5)
WBC: 7.8 10*3/uL (ref 4.0–10.5)
nRBC: 1 % — ABNORMAL HIGH (ref 0.0–0.2)

## 2022-09-08 LAB — GLUCOSE, CAPILLARY
Glucose-Capillary: 107 mg/dL — ABNORMAL HIGH (ref 70–99)
Glucose-Capillary: 105 mg/dL — ABNORMAL HIGH (ref 70–99)
Glucose-Capillary: 96 mg/dL (ref 70–99)
Glucose-Capillary: 120 mg/dL — ABNORMAL HIGH (ref 70–99)

## 2022-09-08 LAB — LACTATE DEHYDROGENASE: LDH: 202 U/L — ABNORMAL HIGH (ref 98–192)

## 2022-09-08 MED ORDER — LABETALOL HCL 200 MG PO TABS
400.0000 mg | ORAL_TABLET | Freq: Three times a day (TID) | ORAL | Status: DC
  Administered 2022-09-08 – 2022-09-12 (×11): 400 mg via ORAL
  Filled 2022-09-08 (×11): qty 2

## 2022-09-08 NOTE — Progress Notes (Addendum)
S: Pt verbalizes readiness to be discharged. Pt discussed not liking taking he medication, she endorses the metformin makes her go to the bathroom and labetalol makes her feel weird. Pt verbalized understanding for why she should take it, reviewed POC and answered questions about disease process. Discussed with pt risk versus benefits of CHTN with SI preE and the safety and health of herself and fetus, pt verbalizes being aware. Pt desires wheelchair privileges to visit with family in the lobby, Dr Delora Fuel consulted and okay with it. Pt stable in NAD, VSS.  Denies HA, visual changes, and RUQ pain. Reports active FM and denies feeling ctx/cramping.  O:     09/08/2022    1:41 PM 09/08/2022   10:15 AM 09/08/2022    3:50 AM  Vitals with BMI  Systolic 161 096 045  Diastolic 80 86 71  Pulse 79 75 77   CBG (last 3)  Recent Labs    09/07/22 2200 09/08/22 0600 09/08/22 1209  GLUCAP 106* 107* 105*    Intake/Output Summary (Last 24 hours) at 09/08/2022 1553 Last data filed at 09/08/2022 0935 Gross per 24 hour  Intake 1675 ml  Output 2636 ml  Net -961 ml  Phys exam: Gen: A/O x3, no distress Chest/Lungs: unlabored Heart/Pulse: RRR  Abdomen: soft, gravid, nontender Neuro: DTRs 1+, clonus absent EXT: +1 non-pitting edema to lower extrimities, neg for pain, tenderness, and cords  NST - 155/ moderate/ accels present 10x10 and 15x15/ decels absent TOCO - none traced, but did note Q2-3 min  cat 1/6 at night that resolves, pt was unable to feel cxt and RN did not palpate any, denies bleeding.   A/P 42 y.o. [id number] 23w4dCHTN with S/I pre-e with sever features   -Current BP 155/80, asymptomatic   -Increased Labetalol to 400 mg PO TID from 300    -S/P mag sulfate (dc'd 09/05/22 @ 1806)   -Wheelchair privileges granted.  Type 2 DM    -HGA1C 7.5    -fasting and 2 hr PP CBG    -SSI    -Semglee 10 units SQ QD    -Metformin 500 mf PO BID Fetal well-being    -NST Q shift    -reactive    -Last [place]3  anterior fundal placenta, AFI WNL 20.6, 4.5lbs, 77%, BPP 8/8.    -Breech    -BMZ 1/3-1/4 Anxiety depression    -Sertraline 50 mg PO daily  MFM Recommendations   - I have counseled the patient extensively about her medical problems with all questions answered - Continue inpatient management - HELLP labs to be repeat daily or more frequent (q6-8h) if blood pressure becomes elevated - 2-3x daily NST  - S/p BMZ and NICU consult - Continue Labetalol with titration as needed, goal BP < 140/90 - Delivery at 34 weeks or sooner if indicated - If the patient has any of the following delivery should be considered: Nonreassuring fetal heart tracing not responsive to intrauterine resuscitation, severe persistent maternal headache despite antihypertensive and analgesic treatment with no other identifiable cause other than preeclampsia, difficult to control blood pressure despite maximum doses of 1-2 antihypertensive medications, maternal labs consistent with preeclampsia with severe features.   DR DDelora Fuelupdated on pt and plan of care and verbalizes agreement.   JHuntingtonCNM 09/08/22 3:53 PM

## 2022-09-09 LAB — COMPREHENSIVE METABOLIC PANEL
ALT: 17 U/L (ref 0–44)
AST: 15 U/L (ref 15–41)
Albumin: 2.9 g/dL — ABNORMAL LOW (ref 3.5–5.0)
Alkaline Phosphatase: 51 U/L (ref 38–126)
Anion gap: 10 (ref 5–15)
BUN: 12 mg/dL (ref 6–20)
CO2: 21 mmol/L — ABNORMAL LOW (ref 22–32)
Calcium: 8.9 mg/dL (ref 8.9–10.3)
Chloride: 101 mmol/L (ref 98–111)
Creatinine, Ser: 0.64 mg/dL (ref 0.44–1.00)
GFR, Estimated: >60 mL/min (ref >60)
Glucose, Bld: 106 mg/dL — ABNORMAL HIGH (ref 70–99)
Potassium: 4 mmol/L (ref 3.5–5.1)
Sodium: 132 mmol/L — ABNORMAL LOW (ref 135–145)
Total Bilirubin: 0.5 mg/dL (ref 0.3–1.2)
Total Protein: 6.3 g/dL — ABNORMAL LOW (ref 6.5–8.1)

## 2022-09-09 LAB — GLUCOSE, CAPILLARY
Glucose-Capillary: 124 mg/dL — ABNORMAL HIGH (ref 70–99)
Glucose-Capillary: 98 mg/dL (ref 70–99)
Glucose-Capillary: 164 mg/dL — ABNORMAL HIGH (ref 70–99)
Glucose-Capillary: 136 mg/dL — ABNORMAL HIGH (ref 70–99)
Glucose-Capillary: 307 mg/dL — ABNORMAL HIGH (ref 70–99)
Glucose-Capillary: 99 mg/dL (ref 70–99)

## 2022-09-09 LAB — CBC
HCT: 31.3 % — ABNORMAL LOW (ref 36.0–46.0)
Hemoglobin: 10.6 g/dL — ABNORMAL LOW (ref 12.0–15.0)
MCH: 29.8 pg (ref 26.0–34.0)
MCHC: 33.9 g/dL (ref 30.0–36.0)
MCV: 87.9 fL (ref 80.0–100.0)
Platelets: 251 10*3/uL (ref 150–400)
RBC: 3.56 MIL/uL — ABNORMAL LOW (ref 3.87–5.11)
RDW: 12 % (ref 11.5–15.5)
WBC: 7.1 10*3/uL (ref 4.0–10.5)
nRBC: 0.6 % — ABNORMAL HIGH (ref 0.0–0.2)

## 2022-09-09 LAB — LACTATE DEHYDROGENASE: LDH: 146 U/L (ref 98–192)

## 2022-09-09 MED ORDER — INSULIN GLARGINE-YFGN 100 UNIT/ML ~~LOC~~ SOLN
12.0000 [IU] | Freq: Every day | SUBCUTANEOUS | Status: DC
  Administered 2022-09-10 – 2022-09-11 (×2): 12 [IU] via SUBCUTANEOUS
  Filled 2022-09-09 (×3): qty 0.12

## 2022-09-09 NOTE — Progress Notes (Addendum)
[name] is a 42 y.o. [id number] at 66w6dadmitted for chronic HTN with superimposed preeclampsia with severe features.  Subjective: Patient denies any complaints. She reports normal fetal movement.   Objective: BP (!) 142/92 (BP Location: Right Arm)   Pulse 82   Temp (!) 97.5 F (36.4 C) (Oral)   Resp 18   Ht '5\' 3"'$  (1.6 m)   Wt 129.7 kg   LMP  (LMP Unknown) Comment: ? June  SpO2 99%   BMI 50.66 kg/m  I/O last 3 completed shifts: In: 9673[P.O.:955] Out: 2025 [Urine:2025] No intake/output data recorded.    09/09/2022    9:41 AM 09/09/2022    6:00 AM 09/09/2022    2:10 AM  Vitals with BMI  Systolic [id number] Diastolic 92 78 73  Pulse 82 75 98    FHT:  FHR: 150 bpm, variability: moderate,  accelerations:  Present,  decelerations:  Absent UC:   none SVE:    Deferred. General: Appears well, no acute distress  CVS: s1, s2, regular rate and rhythm Pulmonary: Clear to auscultation bilaterally Abdomen: Soft, gravid, tender to palpation over fibroids. Extremities: Warm and well perfused, no edema, no calf tenderness bilaterally. 1+ patellar reflex bilaterally.  Labs: Lab Results  Component Value Date   WBC 7.1 09/09/2022   HGB 10.6 (L) 09/09/2022   HCT 31.3 (L) 09/09/2022   MCV 87.9 09/09/2022   PLT 251 09/09/2022   CMP     Component Value Date/Time   NA 132 (L) 09/09/2022 0527   K 4.0 09/09/2022 0527   CL 101 09/09/2022 0527   CO2 21 (L) 09/09/2022 0527   GLUCOSE 106 (H) 09/09/2022 0527   BUN 12 09/09/2022 0527   CREATININE 0.64 09/09/2022 0527   CALCIUM 8.9 09/09/2022 0527   PROT 6.3 (L) 09/09/2022 0527   ALBUMIN 2.9 (L) 09/09/2022 0527   AST 15 09/09/2022 0527   ALT 17 09/09/2022 0527   ALKPHOS 51 09/09/2022 0527   BILITOT 0.5 09/09/2022 0527   GFRNONAA >60 09/09/2022 0527   GFRAA >60 12/22/2019 0555     Units 12:05 (09/09/22) 06:03 (09/09/22) 1 d ago (09/08/22) 1 d ago (09/08/22) 1 d ago (09/08/22) 1 d ago (09/08/22) 2 d ago (09/07/22)   Glucose-Capillary 70 -  99 mg/dL 98 124 High  CM 120 High  CM 96 CM 105 High  CM 107 High  CM 106 High     Current Facility-Administered Medications:    acetaminophen (TYLENOL) tablet 650 mg, 650 mg, Oral, Q6H PRN, OIvin Poot Eboni D, MD   butalbital-acetaminophen-caffeine (FIORICET) 50-325-40 MG per tablet 1 tablet, 1 tablet, Oral, Q6H PRN, OBing MatterD, MD   calcium carbonate (TUMS - dosed in mg elemental calcium) chewable tablet 400 mg of elemental calcium, 2 tablet, Oral, Q4H PRN, OIvin Poot Eboni D, MD, 400 mg of elemental calcium at 09/08/22 1747   docusate sodium (COLACE) capsule 100 mg, 100 mg, Oral, Daily, [place], Eboni D, MD, 100 mg at 09/09/22 0946   doxylamine (Sleep) (UNISOM) tablet 25 mg, 25 mg, Oral, QHS PRN, OIvin Poot Eboni D, MD, 25 mg at 09/09/22 2133   labetalol (NORMODYNE) injection 20 mg, 20 mg, Intravenous, PRN, 20 mg at 09/04/22 2121 **AND** labetalol (NORMODYNE) injection 40 mg, 40 mg, Intravenous, PRN, 40 mg at 09/04/22 2153 **AND** labetalol (NORMODYNE) injection 80 mg, 80 mg, Intravenous, PRN **AND** hydrALAZINE (APRESOLINE) injection 10 mg, 10 mg, Intravenous, PRN **AND** Measure blood pressure, , , Once, OJosefine Class MD  hydrOXYzine (ATARAX) tablet 25 mg, 25 mg, Oral, TID PRN, Ivin Poot, Eboni D, MD   CBG monitoring, , , 4x Daily pc & hs **AND** insulin aspart (novoLOG) injection 0-14 Units, 0-14 Units, Subcutaneous, TID PC, Everett Graff, MD, 2 Units at 09/09/22 2134   insulin glargine-yfgn (SEMGLEE) injection 10 Units, 10 Units, Subcutaneous, Daily, Arrie Eastern, CNM, 10 Units at 09/09/22 1211   labetalol (NORMODYNE) tablet 400 mg, 400 mg, Oral, Q8H, [place], [place], FNP, 400 mg at 09/09/22 1842   lactated ringers infusion, , Intravenous, Continuous, Josefine Class, MD, Stopped at 09/05/22 1806   loratadine (CLARITIN) tablet 10 mg, 10 mg, Oral, Daily, [place], Eboni D, MD, 10 mg at 09/09/22 0946   metFORMIN (GLUCOPHAGE-XR) 24 hr tablet 500 mg, 500 mg, Oral, BID Dolores Patty, MD, 500 mg at 09/09/22 1842   prenatal multivitamin tablet 1 tablet, 1 tablet, Oral, Q1200, Ivin Poot, Eboni D, MD, 1 tablet at 09/09/22 1211   promethazine (PHENERGAN) suppository 25 mg, 25 mg, Rectal, Q6H PRN, Bhambri, Melanie, CNM, 25 mg at 09/07/22 6384   promethazine (PHENERGAN) suppository 25 mg, 25 mg, Rectal, Daily, Delora Fuel, Melissa, DO, 25 mg at 09/09/22 5364   sertraline (ZOLOFT) tablet 50 mg, 50 mg, Oral, Daily, Everett Graff, MD, 50 mg at 09/09/22 0946   valACYclovir (VALTREX) tablet 500 mg, 500 mg, Oral, BID, [place], Eboni D, MD, 500 mg at 09/09/22 2133   zolpidem (AMBIEN) tablet 5 mg, 5 mg, Oral, QHS PRN, Josefine Class, MD   Assessment / Plan: 42 y/o G5P4004 at 31 weeks 6 days EGA with CHTN with superimposed preeclampsia with severe features, with stable maternal and fetal status,   -Betamethasone complete 09/06/22.  - Continue with inpatient care as per MFM consultation. - Plan for delivery at 34 weeks unless with worsening fetal or maternal status as per MFM.   - NST Q shift, daily labs. - Weekly BPP. - Four times daily glucose check with insulin coverage for elevated FSG values as well as oral metformin and Semglee.  - Valtrex for HSV prophylaxis. - With breech presentation on last Ultrasound and is for cesarean delivery  if remains breech.  - Sertraline for depression.  Archie Endo, MD 09/09/2022, 11:41 AM

## 2022-09-09 NOTE — Inpatient Diabetes Management (Signed)
Inpatient Diabetes Program Recommendations  AACE/ADA: New Consensus Statement on Inpatient Glycemic Control (2015)  Target Ranges:  Prepandial:   less than 140 mg/dL      Peak postprandial:   less than 180 mg/dL (1-2 hours)      Critically ill patients:  140 - 180 mg/dL   Lab Results  Component Value Date   GLUCAP 124 (H) 09/09/2022   HGBA1C 6.3 (A) 01/24/2020    Review of Glycemic Control  Latest Reference Range & Units 09/08/22 06:00 09/08/22 12:09 09/08/22 16:27 09/08/22 21:40 09/09/22 06:03  Glucose-Capillary 70 - 99 mg/dL 107 (H) 105 (H) 96 120 (H) 124 (H)  (H): Data is abnormally high  Diabetes history: GDM Outpatient Diabetes medications: Metformin 500 mg QD (NT)- Prior to Pregnancy Current orders for Inpatient glycemic control: Novolog 0-14 units TID, Semglee 10 units QD BMZ x 1 (2)  Inpatient Diabetes Program Recommendations:    Semglee 12 units QD  Will continue to follow while inpatient.  Thank you, Reche Dixon, MSN, [place] Diabetes Coordinator Inpatient Diabetes Program [phone] (team pager from 8a-5p)

## 2022-09-10 LAB — CBC
HCT: 33.5 % — ABNORMAL LOW (ref 36.0–46.0)
Hemoglobin: 11 g/dL — ABNORMAL LOW (ref 12.0–15.0)
MCH: 29.4 pg (ref 26.0–34.0)
MCHC: 32.8 g/dL (ref 30.0–36.0)
MCV: 89.6 fL (ref 80.0–100.0)
Platelets: 266 10*3/uL (ref 150–400)
RBC: 3.74 MIL/uL — ABNORMAL LOW (ref 3.87–5.11)
RDW: 12 % (ref 11.5–15.5)
WBC: 7.1 10*3/uL (ref 4.0–10.5)
nRBC: 0 % (ref 0.0–0.2)

## 2022-09-10 LAB — GLUCOSE, CAPILLARY
Glucose-Capillary: 93 mg/dL (ref 70–99)
Glucose-Capillary: 152 mg/dL — ABNORMAL HIGH (ref 70–99)
Glucose-Capillary: 103 mg/dL — ABNORMAL HIGH (ref 70–99)
Glucose-Capillary: 147 mg/dL — ABNORMAL HIGH (ref 70–99)

## 2022-09-10 LAB — COMPREHENSIVE METABOLIC PANEL
ALT: 19 U/L (ref 0–44)
AST: 28 U/L (ref 15–41)
Albumin: 2.9 g/dL — ABNORMAL LOW (ref 3.5–5.0)
Alkaline Phosphatase: 54 U/L (ref 38–126)
Anion gap: 7 (ref 5–15)
BUN: 11 mg/dL (ref 6–20)
CO2: 23 mmol/L (ref 22–32)
Calcium: 9.2 mg/dL (ref 8.9–10.3)
Chloride: 102 mmol/L (ref 98–111)
Creatinine, Ser: 0.65 mg/dL (ref 0.44–1.00)
GFR, Estimated: >60 mL/min (ref >60)
Glucose, Bld: 94 mg/dL (ref 70–99)
Potassium: 4.5 mmol/L (ref 3.5–5.1)
Sodium: 132 mmol/L — ABNORMAL LOW (ref 135–145)
Total Bilirubin: 0.6 mg/dL (ref 0.3–1.2)
Total Protein: 6.3 g/dL — ABNORMAL LOW (ref 6.5–8.1)

## 2022-09-10 LAB — TYPE AND SCREEN
ABO/RH(D): A POS
Antibody Screen: NEGATIVE

## 2022-09-10 LAB — LACTATE DEHYDROGENASE: LDH: 223 U/L — ABNORMAL HIGH (ref 98–192)

## 2022-09-10 NOTE — Progress Notes (Signed)
Pt without complaints.  No leakage of fluid or VB.  Good FM. DENIES HA, RUQ PAIN OR BLURRED VISION  BP 108/67 (BP Location: Left Arm)   Pulse 80   Temp 97.6 F (36.4 C) (Oral)   Resp 19   Ht '5\' 3"'$  (1.6 m)   Wt 129.7 kg   LMP  (LMP Unknown) Comment: ? June  SpO2 100%   BMI 50.66 kg/m   FHTS  REACTIVE   Toco irregular, every 5-20 minutes  Pt in NAD CV RRR Lungs CTAB abd  Gravid soft and NT GU no vb EXt no calf tenderness Results for orders placed or performed during the hospital encounter of 09/04/22 (from the past 72 hour(s))  CBC     Status: Abnormal   Collection Time: 09/08/22  5:28 AM  Result Value Ref Range   WBC 7.8 4.0 - 10.5 K/uL   RBC 3.62 (L) 3.87 - 5.11 MIL/uL   Hemoglobin 10.6 (L) 12.0 - 15.0 g/dL   HCT 32.5 (L) 36.0 - 46.0 %   MCV 89.8 80.0 - 100.0 fL   MCH 29.3 26.0 - 34.0 pg   MCHC 32.6 30.0 - 36.0 g/dL   RDW 12.1 11.5 - 15.5 %   Platelets 257 150 - 400 K/uL   nRBC 1.0 (H) 0.0 - 0.2 %    Comment: Performed at [facility], [address] [place], [place] [address]  Comprehensive metabolic panel     Status: Abnormal   Collection Time: 09/08/22  5:28 AM  Result Value Ref Range   Sodium 133 (L) 135 - 145 mmol/L   Potassium 4.1 3.5 - 5.1 mmol/L   Chloride 103 98 - 111 mmol/L   CO2 21 (L) 22 - 32 mmol/L   Glucose, Bld 111 (H) 70 - 99 mg/dL    Comment: Glucose reference range applies only to samples taken after fasting for at least 8 hours.   BUN 10 6 - 20 mg/dL   Creatinine, Ser 0.70 0.44 - 1.00 mg/dL   Calcium 9.2 8.9 - 10.3 mg/dL   Total Protein 6.2 (L) 6.5 - 8.1 g/dL   Albumin 2.9 (L) 3.5 - 5.0 g/dL   AST 27 15 - 41 U/L   ALT 18 0 - 44 U/L   Alkaline Phosphatase 46 38 - 126 U/L   Total Bilirubin 0.5 0.3 - 1.2 mg/dL   GFR, Estimated >60 >60 mL/min    Comment: (NOTE) Calculated using the CKD-EPI Creatinine Equation (2021)    Anion gap 9 5 - 15    Comment: Performed at [facility], [place] [address], [place], [place] [address]  Lactate  dehydrogenase     Status: Abnormal   Collection Time: 09/08/22  5:28 AM  Result Value Ref Range   LDH 202 (H) 98 - 192 U/L    Comment: Performed at [facility], [place] [address] [place], [place] [address]  Glucose, capillary     Status: Abnormal   Collection Time: 09/08/22  6:00 AM  Result Value Ref Range   Glucose-Capillary 107 (H) 70 - 99 mg/dL    Comment: Glucose reference range applies only to samples taken after fasting for at least 8 hours.  Glucose, capillary     Status: Abnormal   Collection Time: 09/08/22 12:09 PM  Result Value Ref Range   Glucose-Capillary 105 (H) 70 - 99 mg/dL    Comment: Glucose reference range applies only to samples taken after fasting for at least 8  hours.  Glucose, capillary     Status: None   Collection Time: 09/08/22  4:27 PM  Result Value Ref Range   Glucose-Capillary 96 70 - 99 mg/dL    Comment: Glucose reference range applies only to samples taken after fasting for at least 8 hours.  Glucose, capillary     Status: Abnormal   Collection Time: 09/08/22  9:40 PM  Result Value Ref Range   Glucose-Capillary 120 (H) 70 - 99 mg/dL    Comment: Glucose reference range applies only to samples taken after fasting for at least 8 hours.  CBC     Status: Abnormal   Collection Time: 09/09/22  5:27 AM  Result Value Ref Range   WBC 7.1 4.0 - 10.5 K/uL   RBC 3.56 (L) 3.87 - 5.11 MIL/uL   Hemoglobin 10.6 (L) 12.0 - 15.0 g/dL   HCT 31.3 (L) 36.0 - 46.0 %   MCV 87.9 80.0 - 100.0 fL   MCH 29.8 26.0 - 34.0 pg   MCHC 33.9 30.0 - 36.0 g/dL   RDW 12.0 11.5 - 15.5 %   Platelets 251 150 - 400 K/uL   nRBC 0.6 (H) 0.0 - 0.2 %    Comment: Performed at [facility] [address] [place], [place] [address]  Comprehensive metabolic panel     Status: Abnormal   Collection Time: 09/09/22  5:27 AM  Result Value Ref Range   Sodium 132 (L) 135 - 145 mmol/L   Potassium 4.0 3.5 - 5.1 mmol/L   Chloride 101 98 - 111 mmol/L   CO2 21 (L) 22 - 32 mmol/L   Glucose,  Bld 106 (H) 70 - 99 mg/dL    Comment: Glucose reference range applies only to samples taken after fasting for at least 8 hours.   BUN 12 6 - 20 mg/dL   Creatinine, Ser 0.64 0.44 - 1.00 mg/dL   Calcium 8.9 8.9 - 10.3 mg/dL   Total Protein 6.3 (L) 6.5 - 8.1 g/dL   Albumin 2.9 (L) 3.5 - 5.0 g/dL   AST 15 15 - 41 U/L   ALT 17 0 - 44 U/L   Alkaline Phosphatase 51 38 - 126 U/L   Total Bilirubin 0.5 0.3 - 1.2 mg/dL   GFR, Estimated >60 >60 mL/min    Comment: (NOTE) Calculated using the CKD-EPI Creatinine Equation (2021)    Anion gap 10 5 - 15    Comment: Performed at [facility], [place] [address], [place], [place] [address]  Lactate dehydrogenase     Status: None   Collection Time: 09/09/22  5:27 AM  Result Value Ref Range   LDH 146 98 - 192 U/L    Comment: Performed at [facility], [place] [address], [place], [place] [address]  Glucose, capillary     Status: Abnormal   Collection Time: 09/09/22  6:03 AM  Result Value Ref Range   Glucose-Capillary 124 (H) 70 - 99 mg/dL    Comment: Glucose reference range applies only to samples taken after fasting for at least 8 hours.  Glucose, capillary     Status: None   Collection Time: 09/09/22 12:05 PM  Result Value Ref Range   Glucose-Capillary 98 70 - 99 mg/dL    Comment: Glucose reference range applies only to samples taken after fasting for at least 8 hours.  Glucose, capillary     Status: Abnormal   Collection Time: 09/09/22  4:08 PM  Result Value Ref Range  Glucose-Capillary 164 (H) 70 - 99 mg/dL    Comment: Glucose reference range applies only to samples taken after fasting for at least 8 hours.  Glucose, capillary     Status: Abnormal   Collection Time: 09/09/22  9:28 PM  Result Value Ref Range   Glucose-Capillary 136 (H) 70 - 99 mg/dL    Comment: Glucose reference range applies only to samples taken after fasting for at least 8 hours.  Glucose, capillary     Status: Abnormal   Collection Time: 09/09/22 11:07 PM  Result  Value Ref Range   Glucose-Capillary 307 (H) 70 - 99 mg/dL    Comment: Glucose reference range applies only to samples taken after fasting for at least 8 hours.  Glucose, capillary     Status: None   Collection Time: 09/09/22 11:09 PM  Result Value Ref Range   Glucose-Capillary 99 70 - 99 mg/dL    Comment: Glucose reference range applies only to samples taken after fasting for at least 8 hours.  Glucose, capillary     Status: None   Collection Time: 09/10/22  6:21 AM  Result Value Ref Range   Glucose-Capillary 93 70 - 99 mg/dL    Comment: Glucose reference range applies only to samples taken after fasting for at least 8 hours.  Type and screen [place]     Status: None   Collection Time: 09/10/22  6:24 AM  Result Value Ref Range   ABO/RH(D) A POS    Antibody Screen NEG    Sample Expiration      09/13/2022,2359 Performed at [facility], [place] [address] [place], [place] [address]   CBC     Status: Abnormal   Collection Time: 09/10/22  6:25 AM  Result Value Ref Range   WBC 7.1 4.0 - 10.5 K/uL   RBC 3.74 (L) 3.87 - 5.11 MIL/uL   Hemoglobin 11.0 (L) 12.0 - 15.0 g/dL   HCT 33.5 (L) 36.0 - 46.0 %   MCV 89.6 80.0 - 100.0 fL   MCH 29.4 26.0 - 34.0 pg   MCHC 32.8 30.0 - 36.0 g/dL   RDW 12.0 11.5 - 15.5 %   Platelets 266 150 - 400 K/uL   nRBC 0.0 0.0 - 0.2 %    Comment: Performed at [facility], [place] [address] [place], [place] [address]  Comprehensive metabolic panel     Status: Abnormal   Collection Time: 09/10/22  6:25 AM  Result Value Ref Range   Sodium 132 (L) 135 - 145 mmol/L   Potassium 4.5 3.5 - 5.1 mmol/L   Chloride 102 98 - 111 mmol/L   CO2 23 22 - 32 mmol/L   Glucose, Bld 94 70 - 99 mg/dL    Comment: Glucose reference range applies only to samples taken after fasting for at least 8 hours.   BUN 11 6 - 20 mg/dL   Creatinine, Ser 0.65 0.44 - 1.00 mg/dL   Calcium 9.2 8.9 - 10.3 mg/dL   Total Protein 6.3 (L) 6.5 - 8.1 g/dL   Albumin 2.9  (L) 3.5 - 5.0 g/dL   AST 28 15 - 41 U/L   ALT 19 0 - 44 U/L   Alkaline Phosphatase 54 38 - 126 U/L   Total Bilirubin 0.6 0.3 - 1.2 mg/dL   GFR, Estimated >60 >60 mL/min    Comment: (NOTE) Calculated using the CKD-EPI Creatinine Equation (2021)    Anion gap 7 5 - 15  Comment: Performed at [facility], [place] [address] [place], [place] [address]  Lactate dehydrogenase     Status: Abnormal   Collection Time: 09/10/22  6:25 AM  Result Value Ref Range   LDH 223 (H) 98 - 192 U/L    Comment: Performed at [facility], [place] [address] [place], [place] [address]  Glucose, capillary     Status: Abnormal   Collection Time: 09/10/22 12:26 PM  Result Value Ref Range   Glucose-Capillary 152 (H) 70 - 99 mg/dL    Comment: Glucose reference range applies only to samples taken after fasting for at least 8 hours.  Glucose, capillary     Status: Abnormal   Collection Time: 09/10/22  5:48 PM  Result Value Ref Range   Glucose-Capillary 103 (H) 70 - 99 mg/dL    Comment: Glucose reference range applies only to samples taken after fasting for at least 8 hours.  Glucose, capillary     Status: Abnormal   Collection Time: 09/10/22  9:03 PM  Result Value Ref Range   Glucose-Capillary 147 (H) 70 - 99 mg/dL    Comment: Glucose reference range applies only to samples taken after fasting for at least 8 hours.    Assessment and Plan [redacted]w[redacted]d CHTN WITH SUPERIMPOSE PE.  NO [facility]XS LABS WNL AND FETAL STATUS REASSURING  DM CONTROLLED WITH INSULIN.  AND METFORMIN.  WILL MONITOR  NICU CONSULT CALLED  I DISCUSSED WITH DR SDonalee CitrinAND WOULD OFFER VERSION AT 356WEEKS BEFORE DELIVERY.   IF IT DOESN'T WORK PROCEED WITH CS.MAY USE EPIDURAL  MONITOR CLOSELY

## 2022-09-11 ENCOUNTER — Inpatient Hospital Stay (HOSPITAL_COMMUNITY): Payer: No Typology Code available for payment source

## 2022-09-11 ENCOUNTER — Inpatient Hospital Stay (HOSPITAL_BASED_OUTPATIENT_CLINIC_OR_DEPARTMENT_OTHER): Payer: No Typology Code available for payment source

## 2022-09-11 DIAGNOSIS — O09523 Supervision of elderly multigravida, third trimester: Secondary | ICD-10-CM | POA: Diagnosis not present

## 2022-09-11 DIAGNOSIS — O10013 Pre-existing essential hypertension complicating pregnancy, third trimester: Secondary | ICD-10-CM

## 2022-09-11 DIAGNOSIS — Z3A32 32 weeks gestation of pregnancy: Secondary | ICD-10-CM

## 2022-09-11 DIAGNOSIS — D259 Leiomyoma of uterus, unspecified: Secondary | ICD-10-CM

## 2022-09-11 DIAGNOSIS — O99213 Obesity complicating pregnancy, third trimester: Secondary | ICD-10-CM | POA: Diagnosis not present

## 2022-09-11 DIAGNOSIS — O24313 Unspecified pre-existing diabetes mellitus in pregnancy, third trimester: Secondary | ICD-10-CM

## 2022-09-11 DIAGNOSIS — E669 Obesity, unspecified: Secondary | ICD-10-CM

## 2022-09-11 DIAGNOSIS — O3413 Maternal care for benign tumor of corpus uteri, third trimester: Secondary | ICD-10-CM

## 2022-09-11 LAB — GLUCOSE, CAPILLARY
Glucose-Capillary: 100 mg/dL — ABNORMAL HIGH (ref 70–99)
Glucose-Capillary: 106 mg/dL — ABNORMAL HIGH (ref 70–99)
Glucose-Capillary: 136 mg/dL — ABNORMAL HIGH (ref 70–99)
Glucose-Capillary: 175 mg/dL — ABNORMAL HIGH (ref 70–99)

## 2022-09-11 LAB — COMPREHENSIVE METABOLIC PANEL
ALT: 19 U/L (ref 0–44)
AST: 23 U/L (ref 15–41)
Albumin: 3 g/dL — ABNORMAL LOW (ref 3.5–5.0)
Alkaline Phosphatase: 55 U/L (ref 38–126)
Anion gap: 10 (ref 5–15)
BUN: 12 mg/dL (ref 6–20)
CO2: 21 mmol/L — ABNORMAL LOW (ref 22–32)
Calcium: 9.3 mg/dL (ref 8.9–10.3)
Chloride: 102 mmol/L (ref 98–111)
Creatinine, Ser: 0.7 mg/dL (ref 0.44–1.00)
GFR, Estimated: >60 mL/min (ref >60)
Glucose, Bld: 96 mg/dL (ref 70–99)
Potassium: 4.1 mmol/L (ref 3.5–5.1)
Sodium: 133 mmol/L — ABNORMAL LOW (ref 135–145)
Total Bilirubin: 0.6 mg/dL (ref 0.3–1.2)
Total Protein: 6.3 g/dL — ABNORMAL LOW (ref 6.5–8.1)

## 2022-09-11 LAB — CBC
HCT: 32.2 % — ABNORMAL LOW (ref 36.0–46.0)
Hemoglobin: 10.9 g/dL — ABNORMAL LOW (ref 12.0–15.0)
MCH: 29.7 pg (ref 26.0–34.0)
MCHC: 33.9 g/dL (ref 30.0–36.0)
MCV: 87.7 fL (ref 80.0–100.0)
Platelets: 247 10*3/uL (ref 150–400)
RBC: 3.67 MIL/uL — ABNORMAL LOW (ref 3.87–5.11)
RDW: 12.1 % (ref 11.5–15.5)
WBC: 6.1 10*3/uL (ref 4.0–10.5)
nRBC: 0 % (ref 0.0–0.2)

## 2022-09-11 LAB — LACTATE DEHYDROGENASE: LDH: 168 U/L (ref 98–192)

## 2022-09-11 MED ORDER — ALUM & MAG HYDROXIDE-SIMETH 200-200-20 MG/5ML PO SUSP
30.0000 mL | Freq: Once | ORAL | Status: AC
  Administered 2022-09-11: 30 mL via ORAL
  Filled 2022-09-11: qty 30

## 2022-09-11 NOTE — Progress Notes (Signed)
Initial Nutrition Assessment  DOCUMENTATION CODES:  Morbid obesity  INTERVENTION:  CHO modified gestational diabetic Diet Double protein portions if pts requests   NUTRITION DIAGNOSIS:   Increased nutrient needs related to  (pregnancy and fetal growth requirements) as evidenced by  (32 weeks IUP).  GOAL:   Patient will meet greater than or equal to 90% of their needs  MONITOR:   Weight trends  REASON FOR ASSESSMENT:   Antenatal, LOS   ASSESSMENT:   Now 32 1/7 weeks, adm until delivery at 34 weeks, C-HTN/PEC, DMII. Usual weight ~123Kg, BMI 48. 6 kg weight gain  Diet Order:   Diet Order             Diet gestational carb mod Room service appropriate? Yes; Fluid consistency: Thin  Diet effective now                   EDUCATION NEEDS:   Education needs have been addressed (outpt DM educ 07/10/22)  Skin:  Skin Assessment: Reviewed RN Assessment  Height:   Ht Readings from Last 1 Encounters:  09/04/22 '5\' 3"'$  (1.6 m)    Weight:   Wt Readings from Last 1 Encounters:  09/04/22 129.7 kg    Ideal Body Weight:   115 lbs  BMI:  Body mass index is 50.66 kg/m.  Estimated Nutritional Needs:   Kcal:  2836-6294  Protein:  120-130 g  Fluid:  >2.7 L

## 2022-09-11 NOTE — Consult Note (Signed)
Neonatology Consult  Note:  At the request of the patients obstetrician Dr. Charlesetta Garibaldi I met with [name] who is a 42 y.o. female [id number] at 45w1dpresenting for chronic hypertension with superimposed pre-eclampsia.  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  We reviewed initial delivery room management, including CPAP, [place], and low but certainly possible need for intubation for surfactant administration.  We discussed feeding immaturity and need for full po intake with multiple days of good weight gain and no apnea or bradycardia before discharge.  We reviewed increased risk of jaundice, infection, and temperature instability.   Discussed likely length of stay.  Thank you for allowing [facility] participate in her care.  Please call with questions.  BHiginio Roger DO  Neonatologist  The total length of face-to-face or floor / unit time for this encounter was 35 minutes.  Counseling and / or coordination of care was greater than fifty percent of the time.

## 2022-09-11 NOTE — Progress Notes (Addendum)
[name] is a 42 y.o. [id number] at 44w1dadmitted for chronic HTN with superimposed preeclampsia with severe features.   Subjective: Patient doing well sitting up in chair. She denies headache, blurry vision, chest pain, shortness of breath or RUQ pain. Patient states she needs to leave to handle some things with her bank. Discussed she would have to sign out against medical advice and discussed possible complications. Patient states she will consider staying. Discussed ECV at 34 weeks if fetus still in breech presentation at time of indicated delivery. Patient states she does not want to proceed with ECV and would like to proceed with primary c-section if fetus is breech at time of delivery and desires sterilization with bilateral salpingectomy with c-section. Discussed r/b/a of all procedures.   Objective: BP 125/73 (BP Location: Left Arm)   Pulse 84   Temp 98 F (36.7 C) (Oral)   Resp 18   Ht '5\' 3"'$  (1.6 m)   Wt 129.7 kg   LMP  (LMP Unknown) Comment: ? June  SpO2 97%   BMI 50.66 kg/m  No intake/output data recorded. No intake/output data recorded.  FHT:  NST reassuring, but not reactive, no deceleration, MFM [place]BPP: 6/8 off for breathing (09/11/2022), Total BPP: 6/10, MFM recommend repeat BPP tomorrow 09/12/2022  Narrative & Impression  ----------------------------------------------------------------------  OBSTETRICS REPORT                    (Corrected Final 09/11/2022 01:57 pm) ---------------------------------------------------------------------- Patient Info  ID #:       [id number]                         D.O.B.:  104/13/1982(41 yrs)  Name:       [name]                   Visit Date: 09/11/2022 08:12 am ---------------------------------------------------------------------- Performed By  Attending:        RTama HighMD        Ref. [facility]     [facility]                                                              [facility]                                                             [address]  [place], [place]  Performed By:     Valda Favia          [facility]                    [place]  Referred By:      Donnel Saxon                    CNM ---------------------------------------------------------------------- Orders  #  Description                           Code        Ordered By  1  [place] MFM FETAL BPP WO NON               76819.01    Inez ----------------------------------------------------------------------  #  Order #                     Accession #                Episode #  1  [id number]                   [id number]                 [id number] ---------------------------------------------------------------------- Indications  Obesity complicating pregnancy, third          O99.213  trimester  Hypertension - Chronic/Pre-existing (Meds)     O10.019  Advanced maternal age multigravida 23+,        O22.523  third trimester  Uterine fibroids affecting pregnancy in third  O34.13, D25.9  trimester, antepartum  Diabetes - Pregestational,3rd trimester        O24.313  Low Risk NIPS  [redacted] weeks gestation of pregnancy                Z3A.32 ---------------------------------------------------------------------- Fetal Evaluation  Num Of Fetuses:         1  Fetal Heart Rate(bpm):  143  Cardiac Activity:       Observed  Presentation:  Breech  Placenta:               Anterior Fundal  P. Cord Insertion:      Previously visualized  Amniotic Fluid  AFI FV:      Within normal limits  AFI Sum(cm)     %Tile       Largest Pocket(cm)   19.2            72          10.2  RUQ(cm)       RLQ(cm)       LUQ(cm)        LLQ(cm)  3.2           5.8           0              10.2 ---------------------------------------------------------------------- Biophysical Evaluation  Amniotic F.V:   Within normal limits       F. Tone:        Observed  F. Movement:    Observed                   Score:          6/8  F. Breathing:   Not Observed ---------------------------------------------------------------------- OB History  Gravidity:    5         Term:   4        Prem:   0        SAB:   0  TOP:          0       Ectopic:  0        Living: 4 ---------------------------------------------------------------------- Gestational Age  LMP:           20w 5d        Date:  04/19/22                  EDD:   01/24/23  Best:          32w 1d     Det. By:  Loman Chroman         EDD:   11/05/22                                      (04/19/22) ---------------------------------------------------------------------- Anatomy  Cranium:               Appears normal         Stomach:                Appears normal, left                                                                        sided  Ventricles:            Appears normal         Kidneys:                Appear normal  Thoracic:              Appears normal         Bladder:  Appears normal ---------------------------------------------------------------------- Cervix Uterus Adnexa  Cervix  Not visualized (advanced GA >24wks)  Uterus  No abnormality visualized.  Right Ovary  No adnexal mass visualized.  Left Ovary  No adnexal mass visualized.  Cul De Sac  No free fluid seen.  Adnexa  No abnormality visualized ---------------------------------------------------------------------- Impression  Chronic hypertension with superimposed preeclampsia.  Amniotic fluid is normal and good fetal activity is seen. Fetal  breathing movements did not meet the criteria (BPP) . BPP  6/8.  Breech  presentation.  NST is not reactive. BPP 6/10. ---------------------------------------------------------------------- Recommendations  -Repeat BPP tomorrow and then twice-weekly BPP.  -Delivery planned at 34 weeks. ----------------------------------------------------------------------                      Tama High, MD Electronically Signed Corrected Final Report  09/11/2022 01:57 pm   Labs:  Results for orders placed or performed during the hospital encounter of 09/04/22 (from the past 24 hour(s))  Glucose, capillary     Status: Abnormal   Collection Time: 09/10/22  5:48 PM  Result Value Ref Range   Glucose-Capillary 103 (H) 70 - 99 mg/dL  Glucose, capillary     Status: Abnormal   Collection Time: 09/10/22  9:03 PM  Result Value Ref Range   Glucose-Capillary 147 (H) 70 - 99 mg/dL  CBC     Status: Abnormal   Collection Time: 09/11/22  4:50 AM  Result Value Ref Range   WBC 6.1 4.0 - 10.5 K/uL   RBC 3.67 (L) 3.87 - 5.11 MIL/uL   Hemoglobin 10.9 (L) 12.0 - 15.0 g/dL   HCT 32.2 (L) 36.0 - 46.0 %   MCV 87.7 80.0 - 100.0 fL   MCH 29.7 26.0 - 34.0 pg   MCHC 33.9 30.0 - 36.0 g/dL   RDW 12.1 11.5 - 15.5 %   Platelets 247 150 - 400 K/uL   nRBC 0.0 0.0 - 0.2 %  Comprehensive metabolic panel     Status: Abnormal   Collection Time: 09/11/22  4:50 AM  Result Value Ref Range   Sodium 133 (L) 135 - 145 mmol/L   Potassium 4.1 3.5 - 5.1 mmol/L   Chloride 102 98 - 111 mmol/L   CO2 21 (L) 22 - 32 mmol/L   Glucose, Bld 96 70 - 99 mg/dL   BUN 12 6 - 20 mg/dL   Creatinine, Ser 0.70 0.44 - 1.00 mg/dL   Calcium 9.3 8.9 - 10.3 mg/dL   Total Protein 6.3 (L) 6.5 - 8.1 g/dL   Albumin 3.0 (L) 3.5 - 5.0 g/dL   AST 23 15 - 41 U/L   ALT 19 0 - 44 U/L   Alkaline Phosphatase 55 38 - 126 U/L   Total Bilirubin 0.6 0.3 - 1.2 mg/dL   GFR, Estimated >60 >60 mL/min   Anion gap 10 5 - 15  Lactate dehydrogenase     Status: None   Collection Time: 09/11/22  4:50 AM  Result Value Ref Range   LDH 168 98 -  192 U/L  Glucose, capillary     Status: Abnormal   Collection Time: 09/11/22  7:57 AM  Result Value Ref Range   Glucose-Capillary 100 (H) 70 - 99 mg/dL  Glucose, capillary     Status: Abnormal   Collection Time: 09/11/22 11:34 AM  Result Value Ref Range   Glucose-Capillary 106 (H) 70 - 99 mg/dL    Assessment / Plan: 42 y.o. G5P4004 at [redacted]w[redacted]d Chronic hypertension with superimposed pre-eclampsia  w/ SF due to severe range BP requiring IV antihypertensives Daily PIH labs, T&S q72hr Labetalol 400 mg TID BMZ course completed 1/5 GBS negative Inpatient management until delivery at 33 wga or sooner if worsening maternal or fetal status S/p NICU and MFM consult Nst q shift Repeat BP tomorrow 09/12/2022, then twice weekly Pre-existing MD type 2  Metformin 500 mg BID, Semglee 12U qd Continue to monitor fasting 2hr PP CBGs, cover w/ SSI Diabetic diet Fibroid uterus Left 12.5 x 9.7 x 13 cm 08/09/2022, previously posterior 10.6 x 8.0 x 9.0 cm 04/18/2022 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 Patient desires sterilization with bilateral salpingectomy with c-section. Will schedule with OB OR staff for 34 weeks - tentatively scheduled for 09/24/2021 at 12:15.   Josefine Class, MD 09/11/2022, 1:59 PM

## 2022-09-12 ENCOUNTER — Inpatient Hospital Stay (HOSPITAL_COMMUNITY): Payer: No Typology Code available for payment source

## 2022-09-12 ENCOUNTER — Other Ambulatory Visit: Payer: Self-pay

## 2022-09-12 ENCOUNTER — Encounter (HOSPITAL_COMMUNITY): Payer: Self-pay | Admitting: Obstetrics and Gynecology

## 2022-09-12 ENCOUNTER — Inpatient Hospital Stay (HOSPITAL_COMMUNITY)
Admission: AD | Admit: 2022-09-12 | Discharge: 2022-09-29 | DRG: 784 | Disposition: A | Discharge status: Home / Self Care | Payer: No Typology Code available for payment source | Attending: Obstetrics and Gynecology | Admitting: Obstetrics and Gynecology

## 2022-09-12 DIAGNOSIS — E119 Type 2 diabetes mellitus without complications: Secondary | ICD-10-CM

## 2022-09-12 DIAGNOSIS — O99213 Obesity complicating pregnancy, third trimester: Secondary | ICD-10-CM | POA: Diagnosis not present

## 2022-09-12 DIAGNOSIS — O10013 Pre-existing essential hypertension complicating pregnancy, third trimester: Secondary | ICD-10-CM | POA: Diagnosis not present

## 2022-09-12 DIAGNOSIS — Z98891 History of uterine scar from previous surgery: Principal | ICD-10-CM

## 2022-09-12 DIAGNOSIS — O2412 Pre-existing diabetes mellitus, type 2, in childbirth: Secondary | ICD-10-CM | POA: Diagnosis present

## 2022-09-12 DIAGNOSIS — Z3A33 33 weeks gestation of pregnancy: Secondary | ICD-10-CM

## 2022-09-12 DIAGNOSIS — O09523 Supervision of elderly multigravida, third trimester: Secondary | ICD-10-CM

## 2022-09-12 DIAGNOSIS — Z79899 Other long term (current) drug therapy: Secondary | ICD-10-CM

## 2022-09-12 DIAGNOSIS — E669 Obesity, unspecified: Secondary | ICD-10-CM

## 2022-09-12 DIAGNOSIS — Z6841 Body Mass Index (BMI) 40.0 and over, adult: Secondary | ICD-10-CM

## 2022-09-12 DIAGNOSIS — O1493 Unspecified pre-eclampsia, third trimester: Secondary | ICD-10-CM | POA: Diagnosis present

## 2022-09-12 DIAGNOSIS — Z7984 Long term (current) use of oral hypoglycemic drugs: Secondary | ICD-10-CM

## 2022-09-12 DIAGNOSIS — Z91040 Latex allergy status: Secondary | ICD-10-CM | POA: Diagnosis present

## 2022-09-12 DIAGNOSIS — Z3689 Encounter for other specified antenatal screening: Secondary | ICD-10-CM

## 2022-09-12 DIAGNOSIS — O09529 Supervision of elderly multigravida, unspecified trimester: Secondary | ICD-10-CM

## 2022-09-12 DIAGNOSIS — O3413 Maternal care for benign tumor of corpus uteri, third trimester: Secondary | ICD-10-CM

## 2022-09-12 DIAGNOSIS — O9832 Other infections with a predominantly sexual mode of transmission complicating childbirth: Secondary | ICD-10-CM | POA: Diagnosis present

## 2022-09-12 DIAGNOSIS — O1490 Unspecified pre-eclampsia, unspecified trimester: Principal | ICD-10-CM | POA: Diagnosis present

## 2022-09-12 DIAGNOSIS — Z302 Encounter for sterilization: Secondary | ICD-10-CM

## 2022-09-12 DIAGNOSIS — F32A Depression, unspecified: Secondary | ICD-10-CM | POA: Diagnosis present

## 2022-09-12 DIAGNOSIS — O10919 Unspecified pre-existing hypertension complicating pregnancy, unspecified trimester: Secondary | ICD-10-CM | POA: Diagnosis present

## 2022-09-12 DIAGNOSIS — D259 Leiomyoma of uterus, unspecified: Secondary | ICD-10-CM | POA: Diagnosis present

## 2022-09-12 DIAGNOSIS — O99344 Other mental disorders complicating childbirth: Secondary | ICD-10-CM | POA: Diagnosis present

## 2022-09-12 DIAGNOSIS — R768 Other specified abnormal immunological findings in serum: Secondary | ICD-10-CM | POA: Diagnosis present

## 2022-09-12 DIAGNOSIS — O409XX0 Polyhydramnios, unspecified trimester, not applicable or unspecified: Secondary | ICD-10-CM | POA: Diagnosis not present

## 2022-09-12 DIAGNOSIS — Z3A32 32 weeks gestation of pregnancy: Secondary | ICD-10-CM

## 2022-09-12 DIAGNOSIS — O1002 Pre-existing essential hypertension complicating childbirth: Secondary | ICD-10-CM | POA: Diagnosis present

## 2022-09-12 DIAGNOSIS — O403XX0 Polyhydramnios, third trimester, not applicable or unspecified: Secondary | ICD-10-CM | POA: Diagnosis present

## 2022-09-12 DIAGNOSIS — O321XX0 Maternal care for breech presentation, not applicable or unspecified: Secondary | ICD-10-CM | POA: Diagnosis present

## 2022-09-12 DIAGNOSIS — O114 Pre-existing hypertension with pre-eclampsia, complicating childbirth: Secondary | ICD-10-CM | POA: Diagnosis present

## 2022-09-12 DIAGNOSIS — A6000 Herpesviral infection of urogenital system, unspecified: Secondary | ICD-10-CM | POA: Diagnosis present

## 2022-09-12 DIAGNOSIS — O119 Pre-existing hypertension with pre-eclampsia, unspecified trimester: Principal | ICD-10-CM | POA: Diagnosis present

## 2022-09-12 DIAGNOSIS — F419 Anxiety disorder, unspecified: Secondary | ICD-10-CM | POA: Diagnosis present

## 2022-09-12 DIAGNOSIS — O24113 Pre-existing diabetes mellitus, type 2, in pregnancy, third trimester: Secondary | ICD-10-CM

## 2022-09-12 LAB — CBC
HCT: 33.1 % — ABNORMAL LOW (ref 36.0–46.0)
Hemoglobin: 10.6 g/dL — ABNORMAL LOW (ref 12.0–15.0)
MCH: 29 pg (ref 26.0–34.0)
MCHC: 32 g/dL (ref 30.0–36.0)
MCV: 90.4 fL (ref 80.0–100.0)
Platelets: 237 10*3/uL (ref 150–400)
RBC: 3.66 MIL/uL — ABNORMAL LOW (ref 3.87–5.11)
RDW: 12.2 % (ref 11.5–15.5)
WBC: 6.2 10*3/uL (ref 4.0–10.5)
nRBC: 0 % (ref 0.0–0.2)

## 2022-09-12 LAB — COMPREHENSIVE METABOLIC PANEL
ALT: 17 U/L (ref 0–44)
AST: 18 U/L (ref 15–41)
Albumin: 2.9 g/dL — ABNORMAL LOW (ref 3.5–5.0)
Alkaline Phosphatase: 50 U/L (ref 38–126)
Anion gap: 10 (ref 5–15)
BUN: 12 mg/dL (ref 6–20)
CO2: 23 mmol/L (ref 22–32)
Calcium: 9.2 mg/dL (ref 8.9–10.3)
Chloride: 102 mmol/L (ref 98–111)
Creatinine, Ser: 0.69 mg/dL (ref 0.44–1.00)
GFR, Estimated: >60 mL/min (ref >60)
Glucose, Bld: 102 mg/dL — ABNORMAL HIGH (ref 70–99)
Potassium: 4 mmol/L (ref 3.5–5.1)
Sodium: 135 mmol/L (ref 135–145)
Total Bilirubin: 0.2 mg/dL — ABNORMAL LOW (ref 0.3–1.2)
Total Protein: 6.4 g/dL — ABNORMAL LOW (ref 6.5–8.1)

## 2022-09-12 LAB — GLUCOSE, CAPILLARY
Glucose-Capillary: 94 mg/dL (ref 70–99)
Glucose-Capillary: 148 mg/dL — ABNORMAL HIGH (ref 70–99)

## 2022-09-12 LAB — LACTATE DEHYDROGENASE: LDH: 139 U/L (ref 98–192)

## 2022-09-12 MED ORDER — VALACYCLOVIR HCL 500 MG PO TABS
500.0000 mg | ORAL_TABLET | Freq: Two times a day (BID) | ORAL | Status: DC
  Administered 2022-09-12 – 2022-09-24 (×24): 500 mg via ORAL
  Filled 2022-09-12 (×24): qty 1

## 2022-09-12 MED ORDER — LACTATED RINGERS IV SOLN: INTRAVENOUS | Status: DC

## 2022-09-12 MED ORDER — INSULIN GLARGINE-YFGN 100 UNIT/ML ~~LOC~~ SOLN
15.0000 [IU] | Freq: Every day | SUBCUTANEOUS | Status: DC
  Filled 2022-09-12: qty 0.15

## 2022-09-12 MED ORDER — METFORMIN HCL 500 MG PO TABS
1000.0000 mg | ORAL_TABLET | Freq: Every day | ORAL | Status: DC
  Administered 2022-09-12 – 2022-09-28 (×17): 1000 mg via ORAL
  Filled 2022-09-12 (×18): qty 2

## 2022-09-12 MED ORDER — ACETAMINOPHEN 325 MG PO TABS: 650.0000 mg | ORAL_TABLET | ORAL | Status: DC | PRN

## 2022-09-12 MED ORDER — DOCUSATE SODIUM 100 MG PO CAPS
100.0000 mg | ORAL_CAPSULE | Freq: Every day | ORAL | Status: DC
  Administered 2022-09-13 – 2022-09-29 (×16): 100 mg via ORAL
  Filled 2022-09-12 (×16): qty 1

## 2022-09-12 MED ORDER — HYDRALAZINE HCL 20 MG/ML IJ SOLN: 10.0000 mg | INTRAMUSCULAR | Status: DC | PRN

## 2022-09-12 MED ORDER — METFORMIN HCL 500 MG PO TABS
500.0000 mg | ORAL_TABLET | Freq: Every day | ORAL | Status: DC
  Administered 2022-09-13 – 2022-09-29 (×17): 500 mg via ORAL
  Filled 2022-09-12 (×17): qty 1

## 2022-09-12 MED ORDER — METFORMIN HCL 500 MG PO TABS: 500.0000 mg | ORAL_TABLET | Freq: Every day | ORAL | Status: DC

## 2022-09-12 MED ORDER — INSULIN ASPART 100 UNIT/ML IJ SOLN: 0.0000 [IU] | INTRAMUSCULAR | Status: DC

## 2022-09-12 MED ORDER — DOXYLAMINE SUCCINATE (SLEEP) 25 MG PO TABS
25.0000 mg | ORAL_TABLET | Freq: Every evening | ORAL | Status: DC | PRN
  Administered 2022-09-12 – 2022-09-23 (×12): 25 mg via ORAL
  Filled 2022-09-12 (×12): qty 1

## 2022-09-12 MED ORDER — CALCIUM CARBONATE ANTACID 500 MG PO CHEW
2.0000 | CHEWABLE_TABLET | ORAL | Status: DC | PRN
  Administered 2022-09-23: 400 mg via ORAL
  Filled 2022-09-12: qty 2

## 2022-09-12 MED ORDER — METFORMIN HCL 500 MG PO TABS: 1000.0000 mg | ORAL_TABLET | Freq: Every day | ORAL | Status: DC

## 2022-09-12 MED ORDER — LABETALOL HCL 200 MG PO TABS
400.0000 mg | ORAL_TABLET | Freq: Three times a day (TID) | ORAL | Status: DC
  Administered 2022-09-12 – 2022-09-14 (×5): 400 mg via ORAL
  Filled 2022-09-12 (×6): qty 2

## 2022-09-12 MED ORDER — LABETALOL HCL 5 MG/ML IV SOLN: 80.0000 mg | INTRAVENOUS | Status: DC | PRN

## 2022-09-12 MED ORDER — BUTALBITAL-APAP-CAFFEINE 50-325-40 MG PO TABS: 1.0000 | ORAL_TABLET | Freq: Four times a day (QID) | ORAL | Status: DC | PRN

## 2022-09-12 MED ORDER — LACTATED RINGERS IV SOLN: Freq: Once | INTRAVENOUS | Status: AC

## 2022-09-12 MED ORDER — LORATADINE 10 MG PO TABS
10.0000 mg | ORAL_TABLET | Freq: Every day | ORAL | Status: DC
  Administered 2022-09-12 – 2022-09-29 (×18): 10 mg via ORAL
  Filled 2022-09-12 (×18): qty 1

## 2022-09-12 MED ORDER — INSULIN ASPART 100 UNIT/ML IJ SOLN
0.0000 [IU] | Freq: Three times a day (TID) | INTRAMUSCULAR | Status: DC
  Administered 2022-09-12 – 2022-09-16 (×6): 4 [IU] via SUBCUTANEOUS
  Administered 2022-09-16: 6 [IU] via SUBCUTANEOUS
  Administered 2022-09-17 – 2022-09-23 (×12): 4 [IU] via SUBCUTANEOUS
  Administered 2022-09-24: 6 [IU] via SUBCUTANEOUS

## 2022-09-12 MED ORDER — PROMETHAZINE HCL 25 MG RE SUPP
25.0000 mg | Freq: Every day | RECTAL | Status: DC
  Administered 2022-09-13 – 2022-09-24 (×12): 25 mg via RECTAL
  Filled 2022-09-12 (×17): qty 1

## 2022-09-12 MED ORDER — PRENATAL MULTIVITAMIN CH
1.0000 | ORAL_TABLET | Freq: Every day | ORAL | Status: DC
  Administered 2022-09-13 – 2022-09-23 (×11): 1 via ORAL
  Filled 2022-09-12 (×10): qty 1

## 2022-09-12 MED ORDER — LABETALOL HCL 5 MG/ML IV SOLN
40.0000 mg | INTRAVENOUS | Status: DC | PRN
  Filled 2022-09-12: qty 8

## 2022-09-12 MED ORDER — SERTRALINE HCL 50 MG PO TABS
50.0000 mg | ORAL_TABLET | Freq: Every day | ORAL | Status: DC
  Administered 2022-09-12 – 2022-09-29 (×18): 50 mg via ORAL
  Filled 2022-09-12 (×18): qty 1

## 2022-09-12 MED ORDER — LABETALOL HCL 5 MG/ML IV SOLN
20.0000 mg | INTRAVENOUS | Status: DC | PRN
  Administered 2022-09-24: 20 mg via INTRAVENOUS
  Filled 2022-09-12: qty 4

## 2022-09-12 NOTE — Progress Notes (Signed)
RN called to room. Pt requesting to sign AMA form to leave. RN notified [facility] and Dr. Mancel Bale at [phone]. IV removed. Patient states she will return.

## 2022-09-12 NOTE — MAU Provider Note (Signed)
Event Date/Time   First Provider Initiated Contact with Patient 09/12/22 1727      S Ms. Clarity Ciszek is a 42 y.o. [id number] patient who presents to MAU today stating she is here for readmission. She states she left AMA from [facility] this morning to take care of things at home.   O BP 128/86 (BP Location: Right Arm)   Pulse 96   Temp 98.6 F (37 C) (Oral)   Resp 20   Ht '5\' 3"'$  (1.6 m)   Wt 131.2 kg   LMP  (LMP Unknown) Comment: ? June  SpO2 97%   BMI 51.25 kg/m  Physical Exam Vitals and nursing note reviewed.  Constitutional:      General: She is not in acute distress.    Appearance: She is well-developed.  HENT:     Head: Normocephalic.  Eyes:     Pupils: Pupils are equal, round, and reactive to light.  Cardiovascular:     Rate and Rhythm: Normal rate and regular rhythm.     Heart sounds: Normal heart sounds.  Pulmonary:     Effort: Pulmonary effort is normal. No respiratory distress.     Breath sounds: Normal breath sounds.  Abdominal:     General: Bowel sounds are normal. There is no distension.     Palpations: Abdomen is soft.     Tenderness: There is no abdominal tenderness.  Skin:    General: Skin is warm and dry.  Neurological:     Mental Status: She is alert and oriented to person, place, and time.  Psychiatric:        Mood and Affect: Mood normal.        Behavior: Behavior normal.        Thought Content: Thought content normal.        Judgment: Judgment normal.     A Medical screening exam complete  Dr. Mancel Bale notified of patient arrival. MD states patient needs readmission and will put in orders   P -Admit to [place] turned over to MD  Wende Mott, CNM 09/12/2022 5:27 PM

## 2022-09-12 NOTE — H&P (Addendum)
Jonnell Hentges is a 42 y.o. female presenting for readmission for CHTN with SI Pre-eclampsia with SF after leaving this morning AMA.  Per Crystal, RN patient has no new complaints.  OB History     Gravida  5   Para  4   Term  4   Preterm  0   AB  0   Living  4      SAB  0   IAB  0   Ectopic  0   Multiple  0   Live Births  4          Past Medical History:  Diagnosis Date   Abnormal Pap smear    Anemia    Asthma    Depression    Fibroid    GDM (gestational diabetes mellitus)    H/O varicella    HSV (herpes simplex virus) anogenital infection    Hx of bacterial infection    Hyperemesis    During first pregnancy   Hypertension    Sinusitis    Trichomonas 02/2011   treated   Type 2 diabetes mellitus complicating pregnancy, antepartum    Urinary tract infection    Vaginal Pap smear, abnormal    Past Surgical History:  Procedure Laterality Date   BREAST SURGERY     NO PAST SURGERIES     Family History: family history includes Alcohol abuse in her maternal aunt; Asthma in her mother; Cancer in her maternal uncle; Diabetes in her father and mother; Heart attack in her maternal uncle and paternal uncle; Hypertension in her father and mother; Kidney disease in her maternal uncle and paternal uncle; Peripheral vascular disease in her mother; Seizures in her maternal aunt; Thyroid disease in her mother and paternal aunt. Social History:  reports that she has never smoked. She has never used smokeless tobacco. She reports that she does not drink alcohol and does not use drugs.     Maternal Diabetes: Yes:  Diabetes Type:  Insulin/Medication controlled Genetic Screening: Normal Maternal Ultrasounds/Referrals: Normal Fetal Ultrasounds or other Referrals:  Referred to Materal Fetal Medicine  Maternal Substance Abuse:  No Significant Maternal Medications:  Meds include: Other: metformin, fluoxetine, procardia (noncompliant), aspirin (noncompliant) Significant Maternal  Lab Results:  None Number of Prenatal Visits:greater than 3 verified prenatal visits Other Comments:  None  Review of Systems No F/C/N/V/D  History   Blood pressure 128/86, pulse 96, temperature 98.6 F (37 C), temperature source Oral, resp. rate 20, height '5\' 3"'$  (1.6 m), weight 131.2 kg, SpO2 97 %, unknown if currently breastfeeding. Exam Physical Exam  Resp unlabored CV RRR Abd gravid, NT Ext no calf tenderness  FHT 150, + accels, - decels, mod variability Toco none  Prenatal labs: ABO, Rh:  A pos Antibody:   neg Rubella:   immune RPR:   neg HBsAg:   neg HIV:   neg GBS:   neg (09/05/22)   MFM [place]: 09/04/2022: Breech presentation, anterior fundal placenta, FHR: 132bpm, AFI: 20.6 cm, EFW: 4lbs, 5oz (77%), BPP: 8/8  Assessment/Plan: 42 y.o. G5P4004 at [redacted]w[redacted]d Chronic hypertension with superimposed pre-eclampsia w/ SF due to severe range BP requiring IV antihypertensives Daily PIH labs, T&S q72hr Labetalol 400 mg TID BMZ course completed 1/5 GBS negative Inpatient management until delivery at 386wga or sooner if worsening maternal or fetal status S/p NICU and MFM consult Nst q shift Repeat BPP now, then twice weekly Pre-existing MD type 2  Metformin 500 mg with breakfast and '1000mg'$  with dinner, Semglee  15U qd Continue to monitor fasting 2hr PP CBGs, cover w/ SSI Diabetic diet Fibroid uterus Left 12.5 x 9.7 x 13 cm 08/09/2022, previously posterior 10.6 x 8.0 x 9.0 cm 04/18/2022 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 Patient desires sterilization with bilateral salpingectomy with c-section. Will schedule with OB OR staff for 34 weeks - tentatively scheduled for 09/24/2021 at 12:15.   MFM Recommendations   - I have counseled the patient extensively about her medical problems with all questions  answered - Continue inpatient management - HELLP labs to be repeat daily or more frequent (q6-8h) if blood pressure becomes elevated - 2-3x daily NST  - S/p BMZ and NICU consult - Continue Labetalol with titration as needed, goal BP < 140/90 - Delivery at 34 weeks or sooner if indicated - If the patient has any of the following delivery should be considered: Nonreassuring fetal heart tracing not responsive to intrauterine resuscitation, severe persistent maternal headache despite antihypertensive and analgesic treatment with no other identifiable cause other than preeclampsia, difficult to control blood pressure despite maximum doses of 1-2 antihypertensive medications, maternal labs consistent with preeclampsia with severe features.   [name] 09/12/2022, 5:41 PM

## 2022-09-12 NOTE — MAU Note (Signed)
[name] is a 42 y.o. at 35w2dhere in MAU reporting[facility]. Is here to be readmitted. No PIH s/s. No labor s/s. +FM  Onset of complaint: today  Pain score: 0/10  Vitals:   09/12/22 1721  BP: 128/86  Pulse: 96  Resp: 20  Temp: 98.6 F (37 C)  SpO2: 97%     FHT:160  Lab orders placed from triage: none

## 2022-09-13 ENCOUNTER — Ambulatory Visit: Payer: No Typology Code available for payment source

## 2022-09-13 ENCOUNTER — Other Ambulatory Visit: Payer: No Typology Code available for payment source

## 2022-09-13 LAB — COMPREHENSIVE METABOLIC PANEL
ALT: 17 U/L (ref 0–44)
AST: 20 U/L (ref 15–41)
Albumin: 2.9 g/dL — ABNORMAL LOW (ref 3.5–5.0)
Alkaline Phosphatase: 53 U/L (ref 38–126)
Anion gap: 9 (ref 5–15)
BUN: 12 mg/dL (ref 6–20)
CO2: 22 mmol/L (ref 22–32)
Calcium: 8.8 mg/dL — ABNORMAL LOW (ref 8.9–10.3)
Chloride: 103 mmol/L (ref 98–111)
Creatinine, Ser: 0.7 mg/dL (ref 0.44–1.00)
GFR, Estimated: >60 mL/min (ref >60)
Glucose, Bld: 110 mg/dL — ABNORMAL HIGH (ref 70–99)
Potassium: 4 mmol/L (ref 3.5–5.1)
Sodium: 134 mmol/L — ABNORMAL LOW (ref 135–145)
Total Bilirubin: 0.2 mg/dL — ABNORMAL LOW (ref 0.3–1.2)
Total Protein: 6.2 g/dL — ABNORMAL LOW (ref 6.5–8.1)

## 2022-09-13 LAB — CBC
HCT: 32.5 % — ABNORMAL LOW (ref 36.0–46.0)
Hemoglobin: 10.6 g/dL — ABNORMAL LOW (ref 12.0–15.0)
MCH: 29.5 pg (ref 26.0–34.0)
MCHC: 32.6 g/dL (ref 30.0–36.0)
MCV: 90.5 fL (ref 80.0–100.0)
Platelets: 232 10*3/uL (ref 150–400)
RBC: 3.59 MIL/uL — ABNORMAL LOW (ref 3.87–5.11)
RDW: 12.3 % (ref 11.5–15.5)
WBC: 5.8 10*3/uL (ref 4.0–10.5)
nRBC: 0 % (ref 0.0–0.2)

## 2022-09-13 LAB — TYPE AND SCREEN
ABO/RH(D): A POS
Antibody Screen: NEGATIVE

## 2022-09-13 LAB — GLUCOSE, CAPILLARY
Glucose-Capillary: 89 mg/dL (ref 70–99)
Glucose-Capillary: 89 mg/dL (ref 70–99)
Glucose-Capillary: 124 mg/dL — ABNORMAL HIGH (ref 70–99)
Glucose-Capillary: 150 mg/dL — ABNORMAL HIGH (ref 70–99)

## 2022-09-13 LAB — LACTATE DEHYDROGENASE: LDH: 146 U/L (ref 98–192)

## 2022-09-13 MED ORDER — FLUTICASONE PROPIONATE 50 MCG/ACT NA SUSP
1.0000 | Freq: Every day | NASAL | Status: DC | PRN
  Administered 2022-09-13: 1 via NASAL
  Filled 2022-09-13: qty 16

## 2022-09-13 MED ORDER — INSULIN GLARGINE-YFGN 100 UNIT/ML ~~LOC~~ SOLN
8.0000 [IU] | Freq: Every day | SUBCUTANEOUS | Status: DC
  Administered 2022-09-13 – 2022-09-24 (×12): 8 [IU] via SUBCUTANEOUS
  Filled 2022-09-13 (×13): qty 0.08

## 2022-09-13 NOTE — Progress Notes (Signed)
S:  Reports feeling tired. States she is not accustomed to her blood sugar being in the 80's. Denies feeling lightheaded or dizzy, ambulating safely, and currently sitting in the chair. Reports feeling overall well and the fetal movement is reassuring. Pt states she's been walking in the halls and making trips to the sunroom daily. Denies HA/visual changes/RUQ pain, and has no other concerns.   O:    09/13/2022    7:44 AM 09/13/2022    4:29 AM 09/12/2022   11:27 PM  Vitals with BMI  Systolic 601 093 235  Diastolic 81 78 72  Pulse 73 87 81   CBG (last 3)  Recent Labs    09/12/22 2145 09/13/22 0750 09/13/22 1203  GLUCAP 148* 89 89      Latest Ref Rng & Units 09/13/2022    4:59 AM 09/12/2022    4:18 AM 09/11/2022    4:50 AM  CBC  WBC 4.0 - 10.5 K/uL 5.8  6.2  6.1   Hemoglobin 12.0 - 15.0 g/dL 10.6  10.6  10.9   Hematocrit 36.0 - 46.0 % 32.5  33.1  32.2   Platelets 150 - 400 K/uL 232  237  247       Latest Ref Rng & Units 09/13/2022    4:59 AM 09/12/2022    4:18 AM 09/11/2022    4:50 AM  CMP  Glucose 70 - 99 mg/dL 110  102  96   BUN 6 - 20 mg/dL '12  12  12   '$ Creatinine 0.44 - 1.00 mg/dL 0.70  0.69  0.70   Sodium 135 - 145 mmol/L 134  135  133   Potassium 3.5 - 5.1 mmol/L 4.0  4.0  4.1   Chloride 98 - 111 mmol/L 103  102  102   CO2 22 - 32 mmol/L '22  23  21   '$ Calcium 8.9 - 10.3 mg/dL 8.8  9.2  9.3   Total Protein 6.5 - 8.1 g/dL 6.2  6.4  6.3   Total Bilirubin 0.3 - 1.2 mg/dL 0.2  0.2  0.6   Alkaline Phos 38 - 126 U/L 53  50  55   AST 15 - 41 U/L '20  18  23   '$ ALT 0 - 44 U/L '17  17  19    '$ A/P: 42 y.o. G5P4004 2w3dChronic hypertension with superimposed pre-eclampsia w/ SF due to severe range BP requiring IV antihypertensives Daily PIH labs, T&S q72hr Labetalol 400 mg TID BMZ course completed 1/5 GBS negative Inpatient management until delivery at 34 wks or sooner if worsening maternal or fetal status S/p NICU and MFM consult Nst q shift F/U  BPP 8/8 completed  09/12/2022, continue with twice weekly BPP Pre-existing MD type 2  Metformin 500 mg BID, Semglee decreased to 8 U qd Continue to monitor fasting 2hr PP CBGs, cover w/ SSI Diabetic diet, advised to increase daily protein to 100 g/day Fibroid uterus Left 12.5 x 9.7 x 13 cm 08/09/2022, previously posterior 10.6 x 8.0 x 9.0 cm 04/18/2022 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 Patient desires sterilization with bilateral salpingectomy with c-section. Will schedule with OB OR staff for 34 weeks - tentatively scheduled for 09/24/2021 at 12:15.  Dr. RMancel Baleaware of plan  VBurman Foster DNP, CNM 09/13/2022 2:19 PM

## 2022-09-13 NOTE — Final Progress Note (Signed)
Called by nutritionist recommending decrease in semglee to 8u daily d/t CBG this morning of 89 and had not received any semglee yesterday.  Order modified.

## 2022-09-13 NOTE — Progress Notes (Signed)
Inpatient Diabetes Program Recommendations  AACE/ADA: New Consensus Statement on Inpatient Glycemic Control (2015)  Target Ranges:  Prepandial:   less than 140 mg/dL      Peak postprandial:   less than 180 mg/dL (1-2 hours)      Critically ill patients:  140 - 180 mg/dL   Lab Results  Component Value Date   GLUCAP 89 09/13/2022   HGBA1C 6.3 (A) 01/24/2020    Review of Glycemic Control  Latest Reference Range & Units 09/12/22 17:48 09/12/22 21:45 09/13/22 07:50  Glucose-Capillary 70 - 99 mg/dL 94 148 (H) 89   Diabetes history: GDM Outpatient Diabetes medications:  Metformin 500 mg QD (NT)- Prior to Pregnancy  Current orders for Inpatient glycemic control:  Semglee 12 units daily Novolog 0-24 units tid 2 hours PP Metformin 500 mg with breakfast and 1000 mg with supper Inpatient Diabetes Program Recommendations:    Note that fasting CBG=89 mg/dL.  Patient did not receive Semglee yesterday.  Consider reducing Semglee to 8 units daily. Sent recommendation to Dr. Mancel Bale.    Thanks,  Adah Perl, RN, BC-ADM Inpatient Diabetes Coordinator Pager [phone]  (8a-5p)

## 2022-09-14 LAB — COMPREHENSIVE METABOLIC PANEL
ALT: 16 U/L (ref 0–44)
AST: 21 U/L (ref 15–41)
Albumin: 3.1 g/dL — ABNORMAL LOW (ref 3.5–5.0)
Alkaline Phosphatase: 57 U/L (ref 38–126)
Anion gap: 12 (ref 5–15)
BUN: 12 mg/dL (ref 6–20)
CO2: 21 mmol/L — ABNORMAL LOW (ref 22–32)
Calcium: 9.4 mg/dL (ref 8.9–10.3)
Chloride: 101 mmol/L (ref 98–111)
Creatinine, Ser: 0.69 mg/dL (ref 0.44–1.00)
GFR, Estimated: >60 mL/min (ref >60)
Glucose, Bld: 101 mg/dL — ABNORMAL HIGH (ref 70–99)
Potassium: 4 mmol/L (ref 3.5–5.1)
Sodium: 134 mmol/L — ABNORMAL LOW (ref 135–145)
Total Bilirubin: 0.5 mg/dL (ref 0.3–1.2)
Total Protein: 6.4 g/dL — ABNORMAL LOW (ref 6.5–8.1)

## 2022-09-14 LAB — CBC
HCT: 32.8 % — ABNORMAL LOW (ref 36.0–46.0)
Hemoglobin: 11.1 g/dL — ABNORMAL LOW (ref 12.0–15.0)
MCH: 29.9 pg (ref 26.0–34.0)
MCHC: 33.8 g/dL (ref 30.0–36.0)
MCV: 88.4 fL (ref 80.0–100.0)
Platelets: 233 10*3/uL (ref 150–400)
RBC: 3.71 MIL/uL — ABNORMAL LOW (ref 3.87–5.11)
RDW: 12.4 % (ref 11.5–15.5)
WBC: 6.5 10*3/uL (ref 4.0–10.5)
nRBC: 0.3 % — ABNORMAL HIGH (ref 0.0–0.2)

## 2022-09-14 LAB — GLUCOSE, CAPILLARY
Glucose-Capillary: 82 mg/dL (ref 70–99)
Glucose-Capillary: 100 mg/dL — ABNORMAL HIGH (ref 70–99)
Glucose-Capillary: 134 mg/dL — ABNORMAL HIGH (ref 70–99)
Glucose-Capillary: 140 mg/dL — ABNORMAL HIGH (ref 70–99)

## 2022-09-14 LAB — LACTATE DEHYDROGENASE: LDH: 147 U/L (ref 98–192)

## 2022-09-14 MED ORDER — LABETALOL HCL 200 MG PO TABS
400.0000 mg | ORAL_TABLET | Freq: Two times a day (BID) | ORAL | Status: DC
  Administered 2022-09-15: 400 mg via ORAL
  Filled 2022-09-14 (×2): qty 2

## 2022-09-14 NOTE — Progress Notes (Addendum)
Patient ID: [name], female   DOB: 1981/02/19, 42 y.o.   MRN: [id number] [name] is a 42 y.o. G5P4004 at 17w4dad[facility] with SF by BPs  Hospital Day No: Day 3 (readmit s/p AMA d/c) Day 10 (initial admission)  Subjective: Feels weak and a little SOB after taking shower.  Otherwise denies HA, visual changes or abdominal pain.  Objective: BP 107/68 (BP Location: Right Arm)   Pulse 75   Temp (!) 97.4 F (36.3 C) (Oral)   Resp 20   Ht '5\' 3"'$  (1.6 m)   Wt 131.2 kg   LMP  (LMP Unknown) Comment: ? June  SpO2 97%   BMI 51.25 kg/m  No intake/output data recorded. No intake/output data recorded. CBGs 82-150 O2 sat 99% on RA  Physical Exam:  Gen: alert Chest/Lungs: cta bilaterally  Heart/Pulse: RRR  Abdomen: soft, gravid, nontender, BX x4 quad Uterine fundus: soft, nontender Skin & Color: warm and dry  Neurological: AOx3, DTRs 1+ EXT: negative Homan's b/l, edema 1+  FHT:  FHR: 150 bpm, variability: moderate,  accelerations:   none yet just got back on monitor,  decelerations:  Absent UC:   none SVE:    deferred  Labs: Lab Results  Component Value Date   WBC 6.5 09/14/2022   HGB 11.1 (L) 09/14/2022   HCT 32.8 (L) 09/14/2022   MCV 88.4 09/14/2022   PLT 233 09/14/2022    Assessment and Plan: has Latex allergy; 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  Chronic hypertension with superimposed pre-eclampsia w/ SF due to severe range BP requiring IV antihypertensives Daily PIH labs, T&S  q72hr Labetalol 400 mg TID BMZ course completed 1/5 GBS negative Inpatient management until delivery at 361wga or sooner if worsening maternal or fetal status S/p NICU and MFM consult Nst q shift Repeat BPP twice weekly, Mon and Thurs Pre-existing MD type 2  Metformin 500 mg with breakfast and '1000mg'$  with dinner, Semglee 8u qd Continue to monitor fasting 2hr PP CBGs, cover w/ SSI (s/p 4u last night).  CBGs under decent control. 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 Patient desires sterilization with bilateral salpingectomy with c-section. Will schedule with OB OR staff for 34 weeks - tentatively scheduled for 09/24/2021 at 12:15.        MFM Recommendations   - I have counseled the patient extensively about her medical problems with all questions answered - Continue inpatient management - HELLP labs to be repeat daily or more frequent (q6-8h) if blood pressure becomes elevated - 2-3x daily NST  - S/p BMZ and NICU consult - Continue Labetalol with titration as needed, goal BP < 140/90 - Delivery at 34 weeks or sooner if indicated - If the patient has any of the following delivery should be considered: Nonreassuring fetal heart tracing not responsive to intrauterine resuscitation, severe persistent maternal headache despite antihypertensive  and analgesic treatment with no other identifiable cause other than preeclampsia, difficult to control blood pressure despite maximum doses of 1-2 antihypertensive medications, maternal labs consistent with preeclampsia with severe features.   [name] 09/14/2022, 10:52 AM   BP medication held earlier in the day d/t BP < 120/70.  Pt was  feeling better later in the day.  BP medication changed to '400mg'$  BID.

## 2022-09-15 LAB — COMPREHENSIVE METABOLIC PANEL
ALT: 18 U/L (ref 0–44)
AST: 20 U/L (ref 15–41)
Albumin: 3.1 g/dL — ABNORMAL LOW (ref 3.5–5.0)
Alkaline Phosphatase: 53 U/L (ref 38–126)
Anion gap: 10 (ref 5–15)
BUN: 15 mg/dL (ref 6–20)
CO2: 22 mmol/L (ref 22–32)
Calcium: 9.3 mg/dL (ref 8.9–10.3)
Chloride: 102 mmol/L (ref 98–111)
Creatinine, Ser: 0.79 mg/dL (ref 0.44–1.00)
GFR, Estimated: >60 mL/min (ref >60)
Glucose, Bld: 116 mg/dL — ABNORMAL HIGH (ref 70–99)
Potassium: 4.1 mmol/L (ref 3.5–5.1)
Sodium: 134 mmol/L — ABNORMAL LOW (ref 135–145)
Total Bilirubin: 0.2 mg/dL — ABNORMAL LOW (ref 0.3–1.2)
Total Protein: 6.5 g/dL (ref 6.5–8.1)

## 2022-09-15 LAB — CBC
HCT: 33.3 % — ABNORMAL LOW (ref 36.0–46.0)
Hemoglobin: 11.2 g/dL — ABNORMAL LOW (ref 12.0–15.0)
MCH: 29.9 pg (ref 26.0–34.0)
MCHC: 33.6 g/dL (ref 30.0–36.0)
MCV: 88.8 fL (ref 80.0–100.0)
Platelets: 242 10*3/uL (ref 150–400)
RBC: 3.75 MIL/uL — ABNORMAL LOW (ref 3.87–5.11)
RDW: 12.4 % (ref 11.5–15.5)
WBC: 6.3 10*3/uL (ref 4.0–10.5)
nRBC: 0 % (ref 0.0–0.2)

## 2022-09-15 LAB — GLUCOSE, CAPILLARY
Glucose-Capillary: 100 mg/dL — ABNORMAL HIGH (ref 70–99)
Glucose-Capillary: 94 mg/dL (ref 70–99)
Glucose-Capillary: 105 mg/dL — ABNORMAL HIGH (ref 70–99)
Glucose-Capillary: 107 mg/dL — ABNORMAL HIGH (ref 70–99)

## 2022-09-15 LAB — LACTATE DEHYDROGENASE: LDH: 149 U/L (ref 98–192)

## 2022-09-15 MED ORDER — LABETALOL HCL 200 MG PO TABS
400.0000 mg | ORAL_TABLET | Freq: Three times a day (TID) | ORAL | Status: DC
  Administered 2022-09-16: 400 mg via ORAL
  Filled 2022-09-15 (×2): qty 2

## 2022-09-15 NOTE — Progress Notes (Addendum)
Patient ID: [name], female   DOB: [birth year], 42 y.o.   MRN: [id number] Deryl Ports is a 42 y.o. G5P4004 at 67w5dad[facility] Pre-eclampsia with SF d/t severe range BPs  Hospital Day No: Day 4 (readmit s/p AMA d/c) Day 11 (initial admission)  Subjective: No complaints.  Feels much better today since BP medication decreased.  Denies SOB, HA, visual changes, abdominal pain, LOF, VB or ctxs.  + FM.  Objective: BP 137/88 (BP Location: Right Wrist)   Pulse 86   Temp 97.8 F (36.6 C) (Oral)   Resp 18   Ht '5\' 3"'$  (1.6 m)   Wt 131.2 kg   LMP  (LMP Unknown) Comment: ? June  SpO2 98%   BMI 51.25 kg/m  I/O last 3 completed shifts: In: 1980 [P.O.:1980] Out: 1250 [Urine:1250] No intake/output data recorded.  Physical Exam:  Gen: alert and no distress sitting comfortably in chair Chest/Lungs: cta bilaterally  Heart/Pulse: RRR  Abdomen: soft, gravid, nontender, BX x4 quad Uterine fundus: soft, nontender Skin & Color: warm and dry  Neurological: AOx3, DTRs 1+ EXT: negative calf tenderness  FHT:  FHR: 140 bpm, variability: moderate,  accelerations:  Present,  decelerations:  Absent UC:   none SVE:    deferred  Labs: Lab Results  Component Value Date   WBC 6.3 09/15/2022   HGB 11.2 (L) 09/15/2022   HCT 33.3 (L) 09/15/2022   MCV 88.8 09/15/2022   PLT 242 09/15/2022    Assessment and Plan: has Latex allergy; 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  1.Chronic hypertension with superimposed pre-eclampsia w/ SF  due to severe range BP requiring IV antihypertensives Daily PIH labs, T&S q72hr Labetalol 400 mg TID was dropping BPs low so decreased to '400mg'$  BID yesterday 09/14/22. BMZ course completed 1/5 GBS negative Inpatient management until delivery at 367wga or sooner if worsening maternal or fetal status S/p NICU and MFM consult Nst q shift Repeat BPP twice weekly, Mon and Thurs 2.Pre-existing MD type 2  Metformin 500 mg with breakfast and '1000mg'$  with dinner, Semglee 8u qd Continue to monitor fasting 2hr PP CBGs, cover w/ SSI (s/p 4u last night).  CBGs under decent control. 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 Patient desires sterilization with bilateral salpingectomy with c-section. Will schedule with OB OR staff for 34 weeks - tentatively scheduled for 09/24/2021 at 12:15. FHT cat 1 7.DVT prophylaxis - SCDs when recumbent   MFM Recommendations   - I have counseled the patient extensively about her medical problems with all questions answered - Continue inpatient management - HELLP labs to be repeat daily or more frequent (q6-8h) if blood pressure becomes elevated - 2-3x daily NST  - S/p BMZ and NICU consult - Continue Labetalol with titration as needed, goal BP < 140/90 - Delivery at 34 weeks or sooner if indicated - If the patient has any  of the following delivery should be considered: Nonreassuring fetal heart tracing not responsive to intrauterine resuscitation, severe persistent maternal headache despite antihypertensive and analgesic treatment with no other identifiable cause other than preeclampsia, difficult to control blood pressure despite maximum doses of 1-2  antihypertensive medications, maternal labs consistent with preeclampsia with severe features.  [name] 09/15/2022, 3:53 PM   2038 BPs have crept back up will return to '400mg'$  TID with hold parameters

## 2022-09-16 ENCOUNTER — Inpatient Hospital Stay (HOSPITAL_COMMUNITY): Payer: No Typology Code available for payment source

## 2022-09-16 LAB — COMPREHENSIVE METABOLIC PANEL
ALT: 18 U/L (ref 0–44)
AST: 20 U/L (ref 15–41)
Albumin: 3.1 g/dL — ABNORMAL LOW (ref 3.5–5.0)
Alkaline Phosphatase: 56 U/L (ref 38–126)
Anion gap: 11 (ref 5–15)
BUN: 11 mg/dL (ref 6–20)
CO2: 22 mmol/L (ref 22–32)
Calcium: 9.4 mg/dL (ref 8.9–10.3)
Chloride: 100 mmol/L (ref 98–111)
Creatinine, Ser: 0.7 mg/dL (ref 0.44–1.00)
GFR, Estimated: >60 mL/min (ref >60)
Glucose, Bld: 96 mg/dL (ref 70–99)
Potassium: 3.8 mmol/L (ref 3.5–5.1)
Sodium: 133 mmol/L — ABNORMAL LOW (ref 135–145)
Total Bilirubin: 0.3 mg/dL (ref 0.3–1.2)
Total Protein: 6.8 g/dL (ref 6.5–8.1)

## 2022-09-16 LAB — CBC
HCT: 35.2 % — ABNORMAL LOW (ref 36.0–46.0)
Hemoglobin: 11.4 g/dL — ABNORMAL LOW (ref 12.0–15.0)
MCH: 28.9 pg (ref 26.0–34.0)
MCHC: 32.4 g/dL (ref 30.0–36.0)
MCV: 89.3 fL (ref 80.0–100.0)
Platelets: 252 10*3/uL (ref 150–400)
RBC: 3.94 MIL/uL (ref 3.87–5.11)
RDW: 12.5 % (ref 11.5–15.5)
WBC: 6.8 10*3/uL (ref 4.0–10.5)
nRBC: 0 % (ref 0.0–0.2)

## 2022-09-16 LAB — OB RESULTS CONSOLE GBS: GBS: NEGATIVE

## 2022-09-16 LAB — LACTATE DEHYDROGENASE: LDH: 147 U/L (ref 98–192)

## 2022-09-16 LAB — GLUCOSE, CAPILLARY
Glucose-Capillary: 93 mg/dL (ref 70–99)
Glucose-Capillary: 154 mg/dL — ABNORMAL HIGH (ref 70–99)
Glucose-Capillary: 166 mg/dL — ABNORMAL HIGH (ref 70–99)
Glucose-Capillary: 147 mg/dL — ABNORMAL HIGH (ref 70–99)
Glucose-Capillary: 172 mg/dL — ABNORMAL HIGH (ref 70–99)

## 2022-09-16 LAB — TYPE AND SCREEN
ABO/RH(D): A POS
Antibody Screen: NEGATIVE

## 2022-09-16 MED ORDER — LACTATED RINGERS IV BOLUS
500.0000 mL | Freq: Once | INTRAVENOUS | Status: AC
  Administered 2022-09-16: 500 mL via INTRAVENOUS

## 2022-09-16 MED ORDER — LACTATED RINGERS IV SOLN: INTRAVENOUS | Status: DC

## 2022-09-16 NOTE — Progress Notes (Signed)
I received a phone call from the ultrasound tech that this patient was not feeling well and that she was not able to complete the ultrasound. I went over to check on the patient to see how she was feeling. Pt was diaphoretic and stated "I feel weak". Pt had just had a BM and was coming out of the bathroom when I arrived in the U/S room. CBG was 154. I got the patient some water and a cool cloth. Pt did not feel like she could continue the scan at this time.

## 2022-09-16 NOTE — Progress Notes (Signed)
Patient ID: [name], female   DOB: 18-Oct-1980, 42 y.o.   MRN: [id number] Pt complains of feeling faint.    No leakage of fluid or VB.  Good FM  BP 122/61 (BP Location: Right Arm)   Pulse 84   Temp 97.6 F (36.4 C) (Oral)   Resp 18   Ht '5\' 3"'$  (1.6 m)   Wt 131.2 kg   LMP  (LMP Unknown) Comment: ? June  SpO2 98%   BMI 51.25 kg/m   FHTS  150s with accels  Toco none  Pt in NAD CV RRR Lungs CTAB abd  Gravid soft and NT GU no vb EXt no calf tenderness Results for orders placed or performed during the hospital encounter of 09/12/22 (from the past 72 hour(s))  Glucose, capillary     Status: Abnormal   Collection Time: 09/13/22  9:39 PM  Result Value Ref Range   Glucose-Capillary 150 (H) 70 - 99 mg/dL    Comment: Glucose reference range applies only to samples taken after fasting for at least 8 hours.  Comprehensive metabolic panel     Status: Abnormal   Collection Time: 09/14/22  4:05 AM  Result Value Ref Range   Sodium 134 (L) 135 - 145 mmol/L   Potassium 4.0 3.5 - 5.1 mmol/L   Chloride 101 98 - 111 mmol/L   CO2 21 (L) 22 - 32 mmol/L   Glucose, Bld 101 (H) 70 - 99 mg/dL    Comment: Glucose reference range applies only to samples taken after fasting for at least 8 hours.   BUN 12 6 - 20 mg/dL   Creatinine, Ser 0.69 0.44 - 1.00 mg/dL   Calcium 9.4 8.9 - 10.3 mg/dL   Total Protein 6.4 (L) 6.5 - 8.1 g/dL   Albumin 3.1 (L) 3.5 - 5.0 g/dL   AST 21 15 - 41 U/L   ALT 16 0 - 44 U/L   Alkaline Phosphatase 57 38 - 126 U/L   Total Bilirubin 0.5 0.3 - 1.2 mg/dL   GFR, Estimated >60 >60 mL/min    Comment: (NOTE) Calculated using the CKD-EPI Creatinine Equation (2021)    Anion gap 12 5 - 15    Comment: Performed at [facility], [place] [address], [place], [place] [address]  CBC     Status: Abnormal   Collection Time: 09/14/22  4:05 AM  Result Value Ref Range   WBC 6.5 4.0 - 10.5 K/uL   RBC 3.71 (L) 3.87 - 5.11 MIL/uL   Hemoglobin 11.1 (L) 12.0 - 15.0 g/dL   HCT 32.8 (L)  36.0 - 46.0 %   MCV 88.4 80.0 - 100.0 fL   MCH 29.9 26.0 - 34.0 pg   MCHC 33.8 30.0 - 36.0 g/dL   RDW 12.4 11.5 - 15.5 %   Platelets 233 150 - 400 K/uL   nRBC 0.3 (H) 0.0 - 0.2 %    Comment: Performed at [place] [address], [place], [place] [address]  Lactate dehydrogenase     Status: None   Collection Time: 09/14/22  4:05 AM  Result Value Ref Range   LDH 147 98 - 192 U/L    Comment: Performed at [facility], [place] [address], [place], [place] [address]  Glucose, capillary     Status: None   Collection Time: 09/14/22  9:34 AM  Result Value Ref Range   Glucose-Capillary 82 70 - 99 mg/dL    Comment: Glucose reference range applies only  to samples taken after fasting for at least 8 hours.  Glucose, capillary     Status: Abnormal   Collection Time: 09/14/22 12:02 PM  Result Value Ref Range   Glucose-Capillary 100 (H) 70 - 99 mg/dL    Comment: Glucose reference range applies only to samples taken after fasting for at least 8 hours.  Glucose, capillary     Status: Abnormal   Collection Time: 09/14/22  5:01 PM  Result Value Ref Range   Glucose-Capillary 134 (H) 70 - 99 mg/dL    Comment: Glucose reference range applies only to samples taken after fasting for at least 8 hours.  Glucose, capillary     Status: Abnormal   Collection Time: 09/14/22  9:33 PM  Result Value Ref Range   Glucose-Capillary 140 (H) 70 - 99 mg/dL    Comment: Glucose reference range applies only to samples taken after fasting for at least 8 hours.  Comprehensive metabolic panel     Status: Abnormal   Collection Time: 09/15/22  4:25 AM  Result Value Ref Range   Sodium 134 (L) 135 - 145 mmol/L   Potassium 4.1 3.5 - 5.1 mmol/L   Chloride 102 98 - 111 mmol/L   CO2 22 22 - 32 mmol/L   Glucose, Bld 116 (H) 70 - 99 mg/dL    Comment: Glucose reference range applies only to samples taken after fasting for at least 8 hours.   BUN 15 6 - 20 mg/dL   Creatinine, Ser 0.79 0.44 - 1.00 mg/dL   Calcium 9.3  8.9 - 10.3 mg/dL   Total Protein 6.5 6.5 - 8.1 g/dL   Albumin 3.1 (L) 3.5 - 5.0 g/dL   AST 20 15 - 41 U/L   ALT 18 0 - 44 U/L   Alkaline Phosphatase 53 38 - 126 U/L   Total Bilirubin 0.2 (L) 0.3 - 1.2 mg/dL   GFR, Estimated >60 >60 mL/min    Comment: (NOTE) Calculated using the CKD-EPI Creatinine Equation (2021)    Anion gap 10 5 - 15    Comment: Performed at [facility], [place] [address] [place], [place] [address]  CBC     Status: Abnormal   Collection Time: 09/15/22  4:25 AM  Result Value Ref Range   WBC 6.3 4.0 - 10.5 K/uL   RBC 3.75 (L) 3.87 - 5.11 MIL/uL   Hemoglobin 11.2 (L) 12.0 - 15.0 g/dL   HCT 33.3 (L) 36.0 - 46.0 %   MCV 88.8 80.0 - 100.0 fL   MCH 29.9 26.0 - 34.0 pg   MCHC 33.6 30.0 - 36.0 g/dL   RDW 12.4 11.5 - 15.5 %   Platelets 242 150 - 400 K/uL   nRBC 0.0 0.0 - 0.2 %    Comment: Performed at [facility], [place] [address] [place], [place] [address]  Lactate dehydrogenase     Status: None   Collection Time: 09/15/22  4:25 AM  Result Value Ref Range   LDH 149 98 - 192 U/L    Comment: Performed at [facility], [place] [address], [place], [place] [address]  Glucose, capillary     Status: Abnormal   Collection Time: 09/15/22  7:56 AM  Result Value Ref Range   Glucose-Capillary 100 (H) 70 - 99 mg/dL    Comment: Glucose reference range applies only to samples taken after fasting for at least 8 hours.  Glucose, capillary     Status: None   Collection Time: 09/15/22  12:08 PM  Result Value Ref Range   Glucose-Capillary 94 70 - 99 mg/dL    Comment: Glucose reference range applies only to samples taken after fasting for at least 8 hours.  Glucose, capillary     Status: Abnormal   Collection Time: 09/15/22  4:03 PM  Result Value Ref Range   Glucose-Capillary 105 (H) 70 - 99 mg/dL    Comment: Glucose reference range applies only to samples taken after fasting for at least 8 hours.  Glucose, capillary     Status: Abnormal   Collection Time: 09/15/22   8:02 PM  Result Value Ref Range   Glucose-Capillary 107 (H) 70 - 99 mg/dL    Comment: Glucose reference range applies only to samples taken after fasting for at least 8 hours.  Type and screen [place]     Status: None   Collection Time: 09/16/22  4:47 AM  Result Value Ref Range   ABO/RH(D) A POS    Antibody Screen NEG    Sample Expiration      09/19/2022,2359 Performed at [facility], [place] [address] [place], [place] [address]   Comprehensive metabolic panel     Status: Abnormal   Collection Time: 09/16/22  4:50 AM  Result Value Ref Range   Sodium 133 (L) 135 - 145 mmol/L   Potassium 3.8 3.5 - 5.1 mmol/L   Chloride 100 98 - 111 mmol/L   CO2 22 22 - 32 mmol/L   Glucose, Bld 96 70 - 99 mg/dL    Comment: Glucose reference range applies only to samples taken after fasting for at least 8 hours.   BUN 11 6 - 20 mg/dL   Creatinine, Ser 0.70 0.44 - 1.00 mg/dL   Calcium 9.4 8.9 - 10.3 mg/dL   Total Protein 6.8 6.5 - 8.1 g/dL   Albumin 3.1 (L) 3.5 - 5.0 g/dL   AST 20 15 - 41 U/L   ALT 18 0 - 44 U/L   Alkaline Phosphatase 56 38 - 126 U/L   Total Bilirubin 0.3 0.3 - 1.2 mg/dL   GFR, Estimated >60 >60 mL/min    Comment: (NOTE) Calculated using the CKD-EPI Creatinine Equation (2021)    Anion gap 11 5 - 15    Comment: Performed at [facility] [address] [place], [place] [address]  CBC     Status: Abnormal   Collection Time: 09/16/22  4:50 AM  Result Value Ref Range   WBC 6.8 4.0 - 10.5 K/uL   RBC 3.94 3.87 - 5.11 MIL/uL   Hemoglobin 11.4 (L) 12.0 - 15.0 g/dL   HCT 35.2 (L) 36.0 - 46.0 %   MCV 89.3 80.0 - 100.0 fL   MCH 28.9 26.0 - 34.0 pg   MCHC 32.4 30.0 - 36.0 g/dL   RDW 12.5 11.5 - 15.5 %   Platelets 252 150 - 400 K/uL   nRBC 0.0 0.0 - 0.2 %    Comment: Performed at [facility], [place] [address] [place], [place] [address]  Lactate dehydrogenase     Status: None   Collection Time: 09/16/22  4:50 AM  Result Value Ref Range   LDH  147 98 - 192 U/L    Comment: Performed at [facility], [place] [address], [place], [place] [address]  Glucose, capillary     Status: None   Collection Time: 09/16/22  8:34 AM  Result Value Ref Range   Glucose-Capillary 93 70 -  99 mg/dL    Comment: Glucose reference range applies only to samples taken after fasting for at least 8 hours.  Glucose, capillary     Status: Abnormal   Collection Time: 09/16/22 12:02 PM  Result Value Ref Range   Glucose-Capillary 154 (H) 70 - 99 mg/dL    Comment: Glucose reference range applies only to samples taken after fasting for at least 8 hours.  Glucose, capillary     Status: Abnormal   Collection Time: 09/16/22 12:42 PM  Result Value Ref Range   Glucose-Capillary 166 (H) 70 - 99 mg/dL    Comment: Glucose reference range applies only to samples taken after fasting for at least 8 hours.    Assessment and Plan [redacted]w[redacted]d CHTN with superimposed preeclmapsia.  Pt had a BM before [place] could [place] do the [place] felt weak.  She was given IVF and felt a little better.  Will continue IVF with strict I/Os  Hold BP meds  Repeat labs in the am  BS are stable . She is refusing diabetic meds because she doesn't feel better.  Recommend to take diabetic meds and hold labetalol  If she does not feel better by am would consider sprial CT for R/O PE.  Physcial exam is WNL  Daily weights  Monitor carefully

## 2022-09-16 NOTE — Progress Notes (Signed)
Hospital Day No: 5 (readmit s/p AMA d/c) Day 12 (initial admission)   S: Partially asleep during this morning's visit. Pt perceives active FM and denies contractions. Discussed hypotensive episode on yesterday and change in Labetalol dosing. BP started to increase when change from TID to BID. Pt says she understands hypotensive symptoms to report. Pt has no other concerns.   O:     09/16/2022    8:29 AM 09/16/2022    4:08 AM 09/15/2022   11:41 PM  Vitals with BMI  Systolic [phone]  Diastolic 65 67 64  Pulse 90 72 78   CBC    Component Value Date/Time   WBC 6.8 09/16/2022 0450   RBC 3.94 09/16/2022 0450   HGB 11.4 (L) 09/16/2022 0450   HCT 35.2 (L) 09/16/2022 0450   PLT 252 09/16/2022 0450   MCV 89.3 09/16/2022 0450   MCH 28.9 09/16/2022 0450   MCHC 32.4 09/16/2022 0450   RDW 12.5 09/16/2022 0450   LYMPHSABS 1.2 10/28/2019 1404   MONOABS 0.4 10/28/2019 1404   EOSABS 0.1 10/28/2019 1404   BASOSABS 0.0 10/28/2019 1404      Latest Ref Rng & Units 09/16/2022    4:50 AM 09/15/2022    4:25 AM 09/14/2022    4:05 AM  CMP  Glucose 70 - 99 mg/dL 96  116  101   BUN 6 - 20 mg/dL '11  15  12   '$ Creatinine 0.44 - 1.00 mg/dL 0.70  0.79  0.69   Sodium 135 - 145 mmol/L 133  134  134   Potassium 3.5 - 5.1 mmol/L 3.8  4.1  4.0   Chloride 98 - 111 mmol/L 100  102  101   CO2 22 - 32 mmol/L '22  22  21   '$ Calcium 8.9 - 10.3 mg/dL 9.4  9.3  9.4   Total Protein 6.5 - 8.1 g/dL 6.8  6.5  6.4   Total Bilirubin 0.3 - 1.2 mg/dL 0.3  0.2  0.5   Alkaline Phos 38 - 126 U/L 56  53  57   AST 15 - 41 U/L '20  20  21   '$ ALT 0 - 44 U/L '18  18  16    '$ No intake or output data in the 24 hours ending 09/16/22 2595 Physical Exam:  Gen: right lateral position in bed Chest/Lungs: unlabored  Heart/Pulse: RRR  Abdomen: soft, gravid, non-tender Neurological: AOx3, DTRs 1+, clonus:absent EXT: negative for pain, tenderness, warmth, and cords   NST @ 2212 on 09/15/21: 140 bpm, variability: moderate,   accelerations:  Present,  decelerations:  Absent UC:   none  A/P: 1.Chronic hypertension with superimposed pre-eclampsia w/ SF due to severe range BP requiring IV antihypertensives Daily PIH labs, T&S q72hr Labetalol 400 mg TID decreased to 400 BID for hypotensive value.s Returned to 400 TID after BP values returned to the moderate range BMZ course completed 1/5 GBS negative Inpatient management until delivery at 19 wga or sooner if worsening maternal or fetal status S/p NICU and MFM consult Nst q shift Repeat BPP twice weekly, Mon and Thurs 2.Pre-existing MD type 2  Metformin 500 mg with breakfast and '1000mg'$  with dinner, Semglee 8u qd Continue to monitor fasting 2hr PP CBGs, cover w/ SSI (s/p 4u last night).  CBGs under decent control. 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 Patient desires sterilization with bilateral salpingectomy with c-section. Will schedule with OB OR staff for 34 weeks - tentatively scheduled for 09/24/2021 at 12:15. FHT cat 1 7.DVT prophylaxis - SCDs when recumbent   MFM Recommendations   - I have counseled the patient extensively about her medical problems with all questions answered - Continue inpatient management - HELLP labs to be repeat daily or more frequent (q6-8h) if blood pressure becomes elevated - 2-3x daily NST  - S/p BMZ and NICU consult - Continue Labetalol with titration as needed, goal BP < 140/90 - Delivery at 34 weeks or sooner if indicated - If the patient has any of the following delivery should be considered: Nonreassuring fetal heart tracing not responsive to intrauterine resuscitation, severe persistent maternal headache  despite antihypertensive and analgesic treatment with no other identifiable cause other than preeclampsia, difficult to control blood pressure despite maximum doses of 1-2 antihypertensive medications, maternal labs consistent with preeclampsia with severe features. Burman Foster, DNP, CNM 09/16/2022 9:24 AM

## 2022-09-16 NOTE — Progress Notes (Signed)
Pt brought back to room in the wheelchair. BP 75/51. Will notify MD, give IV fluid bolus and place EFM.

## 2022-09-17 ENCOUNTER — Inpatient Hospital Stay (HOSPITAL_COMMUNITY): Payer: No Typology Code available for payment source

## 2022-09-17 DIAGNOSIS — Z3A33 33 weeks gestation of pregnancy: Secondary | ICD-10-CM

## 2022-09-17 DIAGNOSIS — E669 Obesity, unspecified: Secondary | ICD-10-CM

## 2022-09-17 DIAGNOSIS — O113 Pre-existing hypertension with pre-eclampsia, third trimester: Secondary | ICD-10-CM

## 2022-09-17 DIAGNOSIS — O99213 Obesity complicating pregnancy, third trimester: Secondary | ICD-10-CM | POA: Diagnosis not present

## 2022-09-17 DIAGNOSIS — O24113 Pre-existing diabetes mellitus, type 2, in pregnancy, third trimester: Secondary | ICD-10-CM

## 2022-09-17 DIAGNOSIS — O09523 Supervision of elderly multigravida, third trimester: Secondary | ICD-10-CM | POA: Diagnosis not present

## 2022-09-17 DIAGNOSIS — E119 Type 2 diabetes mellitus without complications: Secondary | ICD-10-CM

## 2022-09-17 LAB — CBC
HCT: 32.8 % — ABNORMAL LOW (ref 36.0–46.0)
Hemoglobin: 11 g/dL — ABNORMAL LOW (ref 12.0–15.0)
MCH: 29.6 pg (ref 26.0–34.0)
MCHC: 33.5 g/dL (ref 30.0–36.0)
MCV: 88.2 fL (ref 80.0–100.0)
Platelets: 237 10*3/uL (ref 150–400)
RBC: 3.72 MIL/uL — ABNORMAL LOW (ref 3.87–5.11)
RDW: 12.4 % (ref 11.5–15.5)
WBC: 7.4 10*3/uL (ref 4.0–10.5)
nRBC: 0 % (ref 0.0–0.2)

## 2022-09-17 LAB — LACTATE DEHYDROGENASE: LDH: 140 U/L (ref 98–192)

## 2022-09-17 LAB — GLUCOSE, CAPILLARY
Glucose-Capillary: 90 mg/dL (ref 70–99)
Glucose-Capillary: 98 mg/dL (ref 70–99)
Glucose-Capillary: 119 mg/dL — ABNORMAL HIGH (ref 70–99)
Glucose-Capillary: 160 mg/dL — ABNORMAL HIGH (ref 70–99)

## 2022-09-17 LAB — COMPREHENSIVE METABOLIC PANEL
ALT: 15 U/L (ref 0–44)
AST: 20 U/L (ref 15–41)
Albumin: 2.9 g/dL — ABNORMAL LOW (ref 3.5–5.0)
Alkaline Phosphatase: 53 U/L (ref 38–126)
Anion gap: 8 (ref 5–15)
BUN: 11 mg/dL (ref 6–20)
CO2: 23 mmol/L (ref 22–32)
Calcium: 9.2 mg/dL (ref 8.9–10.3)
Chloride: 103 mmol/L (ref 98–111)
Creatinine, Ser: 0.69 mg/dL (ref 0.44–1.00)
GFR, Estimated: >60 mL/min (ref >60)
Glucose, Bld: 83 mg/dL (ref 70–99)
Potassium: 4 mmol/L (ref 3.5–5.1)
Sodium: 134 mmol/L — ABNORMAL LOW (ref 135–145)
Total Bilirubin: 0.3 mg/dL (ref 0.3–1.2)
Total Protein: 6.3 g/dL — ABNORMAL LOW (ref 6.5–8.1)

## 2022-09-17 LAB — RAPID URINE DRUG SCREEN, HOSP PERFORMED
Amphetamines: NOT DETECTED
Barbiturates: NOT DETECTED
Benzodiazepines: NOT DETECTED
Cocaine: NOT DETECTED
Opiates: NOT DETECTED
Tetrahydrocannabinol: NOT DETECTED

## 2022-09-17 MED ORDER — LABETALOL HCL 200 MG PO TABS
200.0000 mg | ORAL_TABLET | Freq: Two times a day (BID) | ORAL | Status: DC
  Filled 2022-09-17: qty 1

## 2022-09-17 NOTE — Progress Notes (Signed)
Patient refused to take Labetalol.

## 2022-09-17 NOTE — Progress Notes (Signed)
S:  Pt stable in NAD, VSS.  Denies HA, visual changes, and RUQ pain. Reports active FM and denies feeling ctx/cramping. Pt was up walking around, discussed how pt feels, pt denies feeling bad, but does endorses feeling fatigued, I asked about her labored breathing and pt said "Im just outta shape", discussed checking a UDS to assess, pt verbalized consent to collect, also discussed if SOB or cp will proceed to r/o PE but super low of diff list. Pt denies URI, shortness or breath, denies fever, N/V/D, denies being around anyone sick, pt denies feeling sick, if pt develops any s/sx will run respiratory panel, but no need at this time.   O:     09/17/2022    7:36 AM 09/17/2022    6:29 AM 09/17/2022    3:11 AM  Vitals with BMI  Weight  288 lbs 10 oz   BMI  28.31   Systolic 517  616  Diastolic 60  53  Pulse 76  72   CBG (last 3)  Recent Labs    09/16/22 1712 09/16/22 2011 09/17/22 0736  GLUCAP 147* 172* 90    Intake/Output Summary (Last 24 hours) at 09/17/2022 0954 Last data filed at 09/17/2022 0710 Gross per 24 hour  Intake 2484.58 ml  Output 3010 ml  Net -525.42 ml  Phys exam: Gen: A/O x3, no distress Chest/Lungs: unlabored, CTA Bi-Lat Heart/Pulse: RRR  Abdomen: soft, gravid, nontender Neuro: DTRs 1+, clonus absent EXT: +1 non-pitting edema to lower extrimities, neg for pain, tenderness, and cords  NST - 150/ moderate/ accels present 10x10 and 15x15/ decels absent TOCO - none traced  A/P 42 y.o. [id number] 57w0dCHTN with S/I pre-e with sever features   -Required IV antihypertensives on 1/3   - PCR was 0.4 on admission, other labs been unremarkable, continue daily labs.    -Current BP 118/60, asymptomatic   -Increased Labetalol to 400 mg PO TID from 300 on 1/7, d/c'ed on 1/15 r/t hypotensive/vasovagal s/sx, pt been leaving hospital for walks, ordered USDS to r/o differentials, low on differential is PE will get CT scan if pt has SOB, CP, coughing.     -S/P mag sulfate (dc'd  09/05/22 @ 1806)   -Wheelchair privileges granted.    -Plan for PCS at 34 weeks with BTL. Type 2 DM    -HGA1C 7.5    -fasting and 2 hr PP CBG   -elevated CBG PP in 140s    -SSI    -Semglee 10 units, increased on  SQ QD to 12 on 1/8, decreased to 8 on 1/12, 1/15  pt declined to take r/t pt endorses "makes me feel bad:.    -Metformin 500 mf PO in the am and '1000mg'$  at night Fetal well-being    -NST Q shift    -reactive    -Last [place]1/3 anterior fundal placenta, AFI WNL 20.6, 4.5lbs, 77%, BPP 8/8.    -Last BPP 8/8 on 1/11 after coming back from leaving AMA.    -Plan for repeat BPP today, unable to get onoe yesterday r/t not feeling well.     -Breech    -BMZ 1/3-1/4 Anxiety depression    -Sertraline 50 mg PO daily  MFM Recommendations   - Continue inpatient management - HELLP labs to be repeat daily or more frequent (q6-8h) if blood pressure becomes elevated - 2-3x daily NST  - S/p BMZ and NICU consult - Continue Labetalol with titration as needed, goal BP < 140/90 - Delivery at 34 weeks  or sooner if indicated - If the patient has any of the following delivery should be considered: Nonreassuring fetal heart tracing not responsive to intrauterine resuscitation, severe persistent maternal headache despite antihypertensive and analgesic treatment with no other identifiable cause other than preeclampsia, difficult to control blood pressure despite maximum doses of 1-2 antihypertensive medications, maternal labs consistent with preeclampsia with severe features.   DR Orlie Dakin updated on pt and plan of care and verbalizes agreement.   [place] CNM 09/17/22 9:54 AM

## 2022-09-17 NOTE — Discharge Summary (Signed)
Obstetric Discharge Summary - Patient left AMA on 09/12/2022  Reason for Admission: [name] is a 42 y.o. [id number] at 20w1dadmitted for chronic HTN with superimposed preeclampsia with severe features.  Prenatal Procedures: ultrasound  Hemoglobin  Date Value Ref Range Status  09/17/2022 11.0 (L) 12.0 - 15.0 g/dL Final   HCT  Date Value Ref Range Status  09/17/2022 32.8 (L) 36.0 - 46.0 % Final    Physical Exam from 09/11/2022 - patient left AMA prior to physician evaluation:  General: alert, cooperative, and no distress  FHT:  NST reassuring, but not reactive, no deceleration, MFM UKoreaBPP: 6/8 off for breathing (09/11/2022), Total BPP: 6/10, MFM recommend repeat BPP tomorrow 09/12/2022   Discharge Diagnoses: Preelampsia  42y.o. G5P4004 at 345w2dChronic hypertension with superimposed pre-eclampsia w/ SF due to severe range BP requiring IV antihypertensives Daily PIH labs, T&S q72hr Labetalol 400 mg TID BMZ course completed 1/5 GBS negative Inpatient management until delivery at 344ga or sooner if worsening maternal or fetal status S/p NICU and MFM consult Nst q shift Repeat BP tomorrow 09/12/2022, then twice weekly Pre-existing MD type 2  Metformin 500 mg BID, Semglee 12U qd Continue to monitor fasting 2hr PP CBGs, cover w/ SSI Diabetic diet Fibroid uterus Left 12.5 x 9.7 x 13 cm 08/09/2022, previously posterior 10.6 x 8.0 x 9.0 cm 04/18/2022 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 Patient desires sterilization with bilateral salpingectomy with c-section. Will schedule with OB OR staff for 34 weeks - tentatively scheduled for 09/24/2021 at 12:15.  Discharge Information: Date: 09/12/2022 Patient left AMA at 0740 to handle some personal things advised other wise and instructed to return to hospital  ASAP.    EbJosefine Class/16/2024, 2:33 PM

## 2022-09-18 LAB — COMPREHENSIVE METABOLIC PANEL
ALT: 19 U/L (ref 0–44)
AST: 22 U/L (ref 15–41)
Albumin: 3 g/dL — ABNORMAL LOW (ref 3.5–5.0)
Alkaline Phosphatase: 58 U/L (ref 38–126)
Anion gap: 8 (ref 5–15)
BUN: 12 mg/dL (ref 6–20)
CO2: 24 mmol/L (ref 22–32)
Calcium: 8.9 mg/dL (ref 8.9–10.3)
Chloride: 102 mmol/L (ref 98–111)
Creatinine, Ser: 0.84 mg/dL (ref 0.44–1.00)
GFR, Estimated: >60 mL/min (ref >60)
Glucose, Bld: 101 mg/dL — ABNORMAL HIGH (ref 70–99)
Potassium: 4 mmol/L (ref 3.5–5.1)
Sodium: 134 mmol/L — ABNORMAL LOW (ref 135–145)
Total Bilirubin: 0.1 mg/dL — ABNORMAL LOW (ref 0.3–1.2)
Total Protein: 6.5 g/dL (ref 6.5–8.1)

## 2022-09-18 LAB — CBC
HCT: 32.6 % — ABNORMAL LOW (ref 36.0–46.0)
Hemoglobin: 10.8 g/dL — ABNORMAL LOW (ref 12.0–15.0)
MCH: 29.8 pg (ref 26.0–34.0)
MCHC: 33.1 g/dL (ref 30.0–36.0)
MCV: 89.8 fL (ref 80.0–100.0)
Platelets: 233 10*3/uL (ref 150–400)
RBC: 3.63 MIL/uL — ABNORMAL LOW (ref 3.87–5.11)
RDW: 12.7 % (ref 11.5–15.5)
WBC: 6.6 10*3/uL (ref 4.0–10.5)
nRBC: 0 % (ref 0.0–0.2)

## 2022-09-18 LAB — GLUCOSE, CAPILLARY
Glucose-Capillary: 93 mg/dL (ref 70–99)
Glucose-Capillary: 103 mg/dL — ABNORMAL HIGH (ref 70–99)
Glucose-Capillary: 148 mg/dL — ABNORMAL HIGH (ref 70–99)
Glucose-Capillary: 145 mg/dL — ABNORMAL HIGH (ref 70–99)

## 2022-09-18 LAB — LACTATE DEHYDROGENASE: LDH: 179 U/L (ref 98–192)

## 2022-09-18 MED ORDER — ORAL CARE MOUTH RINSE: 15.0000 mL | OROMUCOSAL | Status: DC | PRN

## 2022-09-18 MED ORDER — LABETALOL HCL 200 MG PO TABS
200.0000 mg | ORAL_TABLET | Freq: Two times a day (BID) | ORAL | Status: DC
  Filled 2022-09-18 (×4): qty 1

## 2022-09-18 NOTE — Progress Notes (Signed)
Patient ID: [name], female   DOB: [birth year], 42 y.o.   MRN: [id number] [name] is a 42 y.o. G5P4004 at 63w1dad[facility] with SF with severe range BPs.  Hospital Day No: Day 7 (readmit s/p AMA d/c) Day 14 (initial admission)   Subjective: Pt says she has been refusing BP medication that was restarted last night at '200mg'$  labetalol BID.  She is taking the semglee.  She says she regularly forgets to put SCDs on.  Denies HA, visual changes or abdominal pain.  Denies feeling ctxs although was told she had some on the monitor last night.  Denies LOF, VB and reports +FM.  Objective: BP (!) 140/88 (BP Location: Right Arm)   Pulse (!) 103   Temp 98.3 F (36.8 C) (Oral)   Resp 18   Ht '5\' 3"'$  (1.6 m)   Wt 129.3 kg   LMP  (LMP Unknown) Comment: ? June  SpO2 100%   BMI 50.49 kg/m  I/O last 3 completed shifts: In: 2417.5 [P.O.:480; I.V.:1937.5] Out: 3100 [Urine:3100] No intake/output data recorded.  Physical Exam:  Gen: alert and no distress sitting up in chair Chest/Lungs: unlabord  Heart/Pulse: RRR  Abdomen: soft, gravid, nontender Uterine fundus: soft, nontender Skin & Color: warm and dry  Neurological: AOx3, DTRs 1+ EXT: negative Homan's b/l, edema trace  FHT:  FHR: 150 bpm, variability: moderate,  accelerations:  Present,  decelerations:  Absent UC:   rare SVE:   deferred   Labs: Lab Results  Component Value Date   WBC 6.6 09/18/2022   HGB 10.8 (L) 09/18/2022   HCT 32.6 (L) 09/18/2022   MCV 89.8 09/18/2022   PLT 233 09/18/2022    Assessment and Plan: has Latex allergy; Allergy or intolerance to drug--flagyl; Depression / anxiety; SVD (spontaneous vaginal delivery); Encounter for planned induction of labor; Chronic hypertension affecting pregnancy; BMI 45.0-49.9, adult ([place]; Postpartum care following vaginal delivery 4/20; Diabetes ([place]; Preeclampsia, third trimester; Chronic hypertension with superimposed pre-eclampsia; [redacted] weeks  gestation of pregnancy; NST (non-stress test) reactive; Preeclampsia; and Hypertension in pregnancy, pre-eclampsia on their problem list.  42y.o. [id number] 313w1ddmitted [facility] Pre-eclampsia with SF with severe range BPs although BPs have been much better controlled recently requiring decrease in BP medication.  Pt has actually been refusing medication d/t an episode of syncope while getting BPP performed on Monday.  1.Chronic hypertension with superimposed pre-eclampsia w/ SF due to severe range BP requiring IV antihypertensives Daily PIH labs, T&S q72hr Labetalol 400 mg TID was dropping BPs so BP medication d/c'd after syncopal episode.  BPs increased again and labetalol was restarted last night at '200mg'$  BID. BMZ course completed 1/5 GBS negative Inpatient management until delivery at 34 wks or sooner if worsening maternal or fetal status S/p NICU and MFM consult Nst q shift Repeat BPP twice weekly, Mon and Thurs.  BPP 8/8 this past Monday. 2.Pre-existing MD type 2  Metformin 500 mg with breakfast and '1000mg'$  with dinner, Semglee 8u qd Continue to monitor fasting 2hr PP CBGs, cover w/ SSI (s/p 4u last night).  CBGs under decent control. 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 Patient desires sterilization with bilateral  salpingectomy with c-section. Will schedule with OB OR staff for 34 weeks - tentatively scheduled for 09/24/2021 at 12:15. FHT cat 1 7.DVT prophylaxis - SCDs when recumbent and RN notified to help remind patient.   MFM Recommendations   - I have counseled the patient extensively about her medical problems with all questions answered - Continue  inpatient management - HELLP labs to be repeat daily or more frequent (q6-8h) if blood pressure becomes elevated - 2-3x daily NST  - S/p BMZ and NICU consult - Continue Labetalol with titration as needed, goal BP < 140/90 - Delivery at 34 weeks or sooner if indicated - If the patient has any of the following delivery should be considered: Nonreassuring fetal heart tracing not responsive to intrauterine resuscitation, severe persistent maternal headache despite antihypertensive and analgesic treatment with no other identifiable cause other than preeclampsia, difficult to control blood pressure despite maximum doses of 1-2 antihypertensive medications, maternal labs consistent with preeclampsia with severe features.  [name] 09/18/2022, 3:13 PM

## 2022-09-18 NOTE — Plan of Care (Signed)
  Problem: Activity: Goal: Risk for activity intolerance will decrease Outcome: Completed/Met   Problem: Nutrition: Goal: Adequate nutrition will be maintained Outcome: Completed/Met   Problem: Coping: Goal: Level of anxiety will decrease Outcome: Completed/Met   Problem: Elimination: Goal: Will not experience complications related to bowel motility Outcome: Completed/Met Goal: Will not experience complications related to urinary retention Outcome: Completed/Met   

## 2022-09-19 ENCOUNTER — Encounter (HOSPITAL_COMMUNITY): Payer: Self-pay | Admitting: Obstetrics and Gynecology

## 2022-09-19 ENCOUNTER — Inpatient Hospital Stay (HOSPITAL_BASED_OUTPATIENT_CLINIC_OR_DEPARTMENT_OTHER): Payer: No Typology Code available for payment source

## 2022-09-19 DIAGNOSIS — O09523 Supervision of elderly multigravida, third trimester: Secondary | ICD-10-CM

## 2022-09-19 DIAGNOSIS — O09529 Supervision of elderly multigravida, unspecified trimester: Secondary | ICD-10-CM

## 2022-09-19 DIAGNOSIS — O403XX0 Polyhydramnios, third trimester, not applicable or unspecified: Secondary | ICD-10-CM

## 2022-09-19 DIAGNOSIS — Z3A33 33 weeks gestation of pregnancy: Secondary | ICD-10-CM

## 2022-09-19 DIAGNOSIS — O24113 Pre-existing diabetes mellitus, type 2, in pregnancy, third trimester: Secondary | ICD-10-CM

## 2022-09-19 DIAGNOSIS — O99213 Obesity complicating pregnancy, third trimester: Secondary | ICD-10-CM | POA: Diagnosis not present

## 2022-09-19 DIAGNOSIS — E669 Obesity, unspecified: Secondary | ICD-10-CM | POA: Diagnosis not present

## 2022-09-19 DIAGNOSIS — E119 Type 2 diabetes mellitus without complications: Secondary | ICD-10-CM

## 2022-09-19 DIAGNOSIS — O113 Pre-existing hypertension with pre-eclampsia, third trimester: Secondary | ICD-10-CM | POA: Diagnosis not present

## 2022-09-19 DIAGNOSIS — R7689 Other specified abnormal immunological findings in serum: Secondary | ICD-10-CM | POA: Diagnosis present

## 2022-09-19 DIAGNOSIS — R768 Other specified abnormal immunological findings in serum: Secondary | ICD-10-CM | POA: Diagnosis present

## 2022-09-19 DIAGNOSIS — D259 Leiomyoma of uterus, unspecified: Secondary | ICD-10-CM | POA: Diagnosis present

## 2022-09-19 DIAGNOSIS — Z7984 Long term (current) use of oral hypoglycemic drugs: Secondary | ICD-10-CM

## 2022-09-19 LAB — COMPREHENSIVE METABOLIC PANEL WITH GFR
ALT: 17 U/L (ref 0–44)
AST: 22 U/L (ref 15–41)
Albumin: 3.1 g/dL — ABNORMAL LOW (ref 3.5–5.0)
Alkaline Phosphatase: 60 U/L (ref 38–126)
Anion gap: 9 (ref 5–15)
BUN: 11 mg/dL (ref 6–20)
CO2: 22 mmol/L (ref 22–32)
Calcium: 9 mg/dL (ref 8.9–10.3)
Chloride: 102 mmol/L (ref 98–111)
Creatinine, Ser: 0.67 mg/dL (ref 0.44–1.00)
GFR, Estimated: >60 mL/min (ref >60)
Glucose, Bld: 95 mg/dL (ref 70–99)
Potassium: 4.1 mmol/L (ref 3.5–5.1)
Sodium: 133 mmol/L — ABNORMAL LOW (ref 135–145)
Total Bilirubin: 0.1 mg/dL — ABNORMAL LOW (ref 0.3–1.2)
Total Protein: 6.7 g/dL (ref 6.5–8.1)

## 2022-09-19 LAB — GLUCOSE, CAPILLARY
Glucose-Capillary: 91 mg/dL (ref 70–99)
Glucose-Capillary: 96 mg/dL (ref 70–99)
Glucose-Capillary: 148 mg/dL — ABNORMAL HIGH (ref 70–99)
Glucose-Capillary: 137 mg/dL — ABNORMAL HIGH (ref 70–99)

## 2022-09-19 LAB — CBC
HCT: 33.1 % — ABNORMAL LOW (ref 36.0–46.0)
Hemoglobin: 11 g/dL — ABNORMAL LOW (ref 12.0–15.0)
MCH: 29.6 pg (ref 26.0–34.0)
MCHC: 33.2 g/dL (ref 30.0–36.0)
MCV: 89 fL (ref 80.0–100.0)
Platelets: 232 K/uL (ref 150–400)
RBC: 3.72 MIL/uL — ABNORMAL LOW (ref 3.87–5.11)
RDW: 12.8 % (ref 11.5–15.5)
WBC: 5.9 K/uL (ref 4.0–10.5)
nRBC: 0 % (ref 0.0–0.2)

## 2022-09-19 LAB — TYPE AND SCREEN
ABO/RH(D): A POS
Antibody Screen: NEGATIVE

## 2022-09-19 LAB — LACTATE DEHYDROGENASE: LDH: 159 U/L (ref 98–192)

## 2022-09-19 NOTE — Progress Notes (Addendum)
[name] is a 42 y.o. [id number] at 53w2dad[facility] with SF with severe range BPs.   Subjective: Patient doing well at this time without complaints. She denies headache, blurry vision, chest pain, shortness of breath or RUQ pain. Notified by RN this morning that patient refusing AM labetalol. Has not taken since 1/15. Bps have been 130s-140s/60s-90s overnight and she had 2 SR BPs this morning of 155/126 and 179/87 at 1000. She refused IV labetalol for correction of BP, and on recheck this morning her BP was 134/77 at 1030. Discussed importance of optimal BP control and discussed parameters of holding labetalol if BP<130/80 and patient amenable. States she will take evening dose if BP >130/80. Fasting CBG controlled in 90s; however, evening CBG elevated at 140s w/ lunch and dinner. Patient does not want to increase her insulin as she states she does not feel well when CBG < 120.  Objective: BP 135/78 (BP Location: Right Arm)   Pulse (!) 109   Temp 98 F (36.7 C)   Resp 18   Ht '5\' 3"'$  (1.6 m)   Wt 131.7 kg   LMP  (LMP Unknown) Comment: ? June  SpO2 99%   BMI 51.44 kg/m  I/O last 3 completed shifts: In: -  Out: 500 [Urine:500] No intake/output data recorded.  Gen: No acute distress, alert sitting up in chair Cardiovascular: RRR Pulmonary: Non-labored breathing CR equal and bilateral Abdomen: Soft, Nontender, Non-distended  FHT:  NST 1000: FHR: 150 bpm, variability: moderate,  accelerations:  Present,  decelerations:  Present isolated variable decel; overall reassuring UC:   none SVE:    deferred  MFM [place]1/18/2024: BPP: 8/8, cephalic, final report pending  Labs: Lab Results  Component Value Date   WBC 5.9 09/19/2022   HGB 11.0 (L) 09/19/2022   HCT 33.1 (L) 09/19/2022   MCV 89.0 09/19/2022   PLT 232 09/19/2022    Assessment / Plan: Patient Active Problem List   Diagnosis Date Noted   AMA (advanced maternal age) multigravida 35+ 09/19/2022    Fibroid uterus 09/19/2022   HSV-2 seropositive 09/19/2022   Preeclampsia 09/12/2022   Hypertension in pregnancy, pre-eclampsia 09/12/2022   Preeclampsia, third trimester 09/04/2022   Chronic hypertension with superimposed pre-eclampsia 09/04/2022   [redacted] weeks gestation of pregnancy 09/04/2022   NST (non-stress test) reactive 09/04/2022   [place] 01/27/2020   Chronic hypertension affecting pregnancy 12/21/2019   BMI 50.0-59.9, adult ([place] 12/21/2019   Depression / anxiety 02/20/2014   Latex allergy 11/29/2013   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 G5P4004 at 358w2ddmitted [facility] Pre-eclampsia with SF with severe range BPs.   1.Chronic hypertension with superimposed pre-eclampsia w/ SF due to severe range BP requiring IV antihypertensives Daily PIH labs, T&S q72hr Labetalol 400 mg TID was dropping BPs so BP medication d/c'd after syncopal episode.  BPs increased again and labetalol was restarted last night at 200 mg BID 1/15 BMZ course completed 1/5 GBS negative Inpatient management until delivery at 34 wks or sooner if worsening maternal or fetal status S/p NICU and MFM consult Nst q shift Repeat BPP twice weekly, Mon and Thurs.  BPP 8/8 today. 2.Pre-existing MD type 2  Metformin 500 mg with breakfast and '1000mg'$  with dinner, Semglee 8u qd Continue to monitor fasting 2hr PP CBGs, cover w/ SSI (s/p 4u last night).  CBGs under decent control. 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 If patient still cephalic plan to cancel primary c-section and proceed with IOL at 0000. Patient desires postpartum bilateral salpingectomy if  proceed with vaginal delivery. Patient  previously declined ECV,  and primary cesarean section with bilateral salpingectomy scheduled for 09/24/2021 at 12:15 at 34 weeks 7.DVT prophylaxis - SCDs when recumbent and RN notified to help remind patient.  MFM Recommendations   - I have counseled the patient extensively about her medical problems with all questions answered - Continue inpatient management - HELLP labs to be repeat daily or more frequent (q6-8h) if blood pressure becomes elevated - 2-3x daily NST  - S/p BMZ and NICU consult - Continue Labetalol with titration as needed, goal BP < 140/90 - Delivery at 34 weeks or sooner if indicated - If the patient has any of the following delivery should be considered: Nonreassuring fetal heart tracing not responsive to intrauterine resuscitation, severe persistent maternal headache despite antihypertensive and analgesic treatment with no other identifiable cause other than preeclampsia, difficult to control blood pressure despite maximum doses of 1-2 antihypertensive medications, maternal labs consistent with preeclampsia with severe features.  Josefine Class, MD 09/19/2022, 2:01 PM

## 2022-09-20 ENCOUNTER — Ambulatory Visit: Payer: No Typology Code available for payment source

## 2022-09-20 ENCOUNTER — Other Ambulatory Visit: Payer: No Typology Code available for payment source

## 2022-09-20 LAB — CBC
HCT: 34.2 % — ABNORMAL LOW (ref 36.0–46.0)
Hemoglobin: 11.7 g/dL — ABNORMAL LOW (ref 12.0–15.0)
MCH: 29.9 pg (ref 26.0–34.0)
MCHC: 34.2 g/dL (ref 30.0–36.0)
MCV: 87.5 fL (ref 80.0–100.0)
Platelets: 230 10*3/uL (ref 150–400)
RBC: 3.91 MIL/uL (ref 3.87–5.11)
RDW: 12.8 % (ref 11.5–15.5)
WBC: 6.7 10*3/uL (ref 4.0–10.5)
nRBC: 0 % (ref 0.0–0.2)

## 2022-09-20 LAB — GLUCOSE, CAPILLARY
Glucose-Capillary: 95 mg/dL (ref 70–99)
Glucose-Capillary: 127 mg/dL — ABNORMAL HIGH (ref 70–99)
Glucose-Capillary: 153 mg/dL — ABNORMAL HIGH (ref 70–99)
Glucose-Capillary: 120 mg/dL — ABNORMAL HIGH (ref 70–99)

## 2022-09-20 LAB — COMPREHENSIVE METABOLIC PANEL
ALT: 20 U/L (ref 0–44)
AST: 20 U/L (ref 15–41)
Albumin: 3 g/dL — ABNORMAL LOW (ref 3.5–5.0)
Alkaline Phosphatase: 62 U/L (ref 38–126)
Anion gap: 10 (ref 5–15)
BUN: 12 mg/dL (ref 6–20)
CO2: 22 mmol/L (ref 22–32)
Calcium: 9.6 mg/dL (ref 8.9–10.3)
Chloride: 102 mmol/L (ref 98–111)
Creatinine, Ser: 0.66 mg/dL (ref 0.44–1.00)
GFR, Estimated: >60 mL/min (ref >60)
Glucose, Bld: 93 mg/dL (ref 70–99)
Potassium: 3.9 mmol/L (ref 3.5–5.1)
Sodium: 134 mmol/L — ABNORMAL LOW (ref 135–145)
Total Bilirubin: 0.3 mg/dL (ref 0.3–1.2)
Total Protein: 6.5 g/dL (ref 6.5–8.1)

## 2022-09-20 LAB — LACTATE DEHYDROGENASE: LDH: 145 U/L (ref 98–192)

## 2022-09-20 NOTE — Progress Notes (Signed)
Initial Nutrition Assessment  DOCUMENTATION CODES:  Morbid obesity  INTERVENTION:  CHO modified gestational diabetic diet Patient may order double protein portions with meals  NUTRITION DIAGNOSIS:   Increased nutrient needs related to  (pregnancy and fetal growth requirements) as evidenced by  (33 weeks IUP).  GOAL:   Patient will meet greater than or equal to 90% of their needs  MONITOR:   Weight trends  REASON FOR ASSESSMENT:   Antenatal, LOS   ASSESSMENT:   Now 33 3/7 weeks IUP, adm w/ C-HTN and PEC, DMII. Usual weight ~123Kg, BMI 48. wt gain of ~ 8.7Kg. Glucose managed with insulin/metformin. on prenatal vits. delivery at 34 weeks   Diet Order:   Diet Order             Diet gestational carb mod Room service appropriate? Yes; Fluid consistency: Thin  Diet effective now                  EDUCATION NEEDS:   No education needs have been identified at this time (outpt educ 07/10/22)  Skin:  Skin Assessment: Reviewed RN Assessment  Height:   Ht Readings from Last 1 Encounters:  09/12/22 '5\' 3"'$  (1.6 m)   Weight:   Wt Readings from Last 1 Encounters:  09/20/22 129.7 kg   Ideal Body Weight:   115 lbs  BMI:  Body mass index is 50.66 kg/m.  Estimated Nutritional Needs:   Kcal:  27-2900  Protein:  120-130 g  Fluid:  >2.7 L

## 2022-09-20 NOTE — Progress Notes (Signed)
[name] is a 42 y.o. [id number] at 68w3dadmitted [facility] Pre[facility] with SF with severe range BPs.   Hospital Day No: Day 9 (readmit s/p AMA d/c) Day 16 (initial admission)   S: Pt refused Labetalol this AM stating, "I don't want to keep feeling bad and I'm just tired of being here". Pt was allowed to express her feelings and thoughts. CNM discussed the reason for the medication and the importance of tight control of BP values. Risk of seizure and stroke reviewed and pt states she will consider taking the medication later today. She reports feeling fine today and talking to family to stay focused on her delivery date in 4 days.   Denies HA, visual changes and RUQ pain. Pt does not perceive contractions.  O:    09/20/2022    4:08 PM 09/20/2022   12:18 PM 09/20/2022   10:57 AM  Vitals with BMI  Systolic [id number] Diastolic 73 90 89  Pulse 98 120 124   Phys Exam: Gen: alert and no distress sitting up in chair Chest/Lungs: unlabord  Heart/Pulse: RRR  Abdomen: soft, gravid, nontender GU: neg for loss of fluid and vaginal bleeding EXT: trace edema, neg for pain, tenderness and cords  NST: 155 baseline/ moderate/ accels present/ decels absent Uterine irritability.   A/P: 42y.o. [id number] hypertension with superimposed pre-eclampsia w/ SF due to severe range BP requiring IV antihypertensives     -daily CBC abd CMP, T&S Q 72 hrs    -Labetalol 200 mg PO BID (pt refused AM dose despite moderate range BP values)    -inpatient mngt until delivery @ 34 wks    -S/P BMZ 09/06/22, NICU, and MFM consult    -GBS neg Fetal well-being    -Imaging 09/19/22 vertex/anterior placenta/ AFI 33.4    -new dx of polyhydramnios    -continue daily NST. Reactive NST 09/20/22    -BPP every Tues and Thu Pre-existing T2DM    -Metformin 500 mg with breakfast and '1000mg'$  with dinner, Semglee 8u qd    -fasting 2hr PP CBGs, cover w/ SSI (s/p 4u last night)    -carb modified diet, protein  intake increased to 100 g/day Fibroid uterus    -Left 12.5 x 9.7 x 13 cm 08/09/2022, previously posterior 10.6 x 8.0 x 9.0 cm 04/18/2022 HSV 1 & 2    -Continue valtrex for suppressive therapy Depression     -Continue Zoloft '50mg'$  daily Plan for IOL on 09/24/22 if vertex or primary C/S if breech. Pt desires permanent sterilization  MFM Recommendations   - I have counseled the patient extensively about her medical problems with all questions answered - Continue inpatient management - HELLP labs to be repeat daily or more frequent (q6-8h) if blood pressure becomes elevated - 2-3x daily NST  - S/p BMZ and NICU consult - Continue Labetalol with titration as needed, goal BP < 140/90 - Delivery at 34 weeks or sooner if indicated - If the patient has any of the following delivery should be considered: Nonreassuring fetal heart tracing not responsive to intrauterine resuscitation, severe persistent maternal headache despite antihypertensive and analgesic treatment with no other identifiable cause other than preeclampsia, difficult to control blood pressure despite maximum doses of 1-2 antihypertensive medications, maternal labs consistent with preeclampsia with severe features.

## 2022-09-21 LAB — CBC
HCT: 34.2 % — ABNORMAL LOW (ref 36.0–46.0)
Hemoglobin: 11.4 g/dL — ABNORMAL LOW (ref 12.0–15.0)
MCH: 29.7 pg (ref 26.0–34.0)
MCHC: 33.3 g/dL (ref 30.0–36.0)
MCV: 89.1 fL (ref 80.0–100.0)
Platelets: 249 10*3/uL (ref 150–400)
RBC: 3.84 MIL/uL — ABNORMAL LOW (ref 3.87–5.11)
RDW: 13 % (ref 11.5–15.5)
WBC: 5.8 10*3/uL (ref 4.0–10.5)
nRBC: 0 % (ref 0.0–0.2)

## 2022-09-21 LAB — COMPREHENSIVE METABOLIC PANEL
ALT: 24 U/L (ref 0–44)
AST: 24 U/L (ref 15–41)
Albumin: 3 g/dL — ABNORMAL LOW (ref 3.5–5.0)
Alkaline Phosphatase: 62 U/L (ref 38–126)
Anion gap: 11 (ref 5–15)
BUN: 12 mg/dL (ref 6–20)
CO2: 20 mmol/L — ABNORMAL LOW (ref 22–32)
Calcium: 9.4 mg/dL (ref 8.9–10.3)
Chloride: 101 mmol/L (ref 98–111)
Creatinine, Ser: 0.66 mg/dL (ref 0.44–1.00)
GFR, Estimated: >60 mL/min (ref >60)
Glucose, Bld: 92 mg/dL (ref 70–99)
Potassium: 3.8 mmol/L (ref 3.5–5.1)
Sodium: 132 mmol/L — ABNORMAL LOW (ref 135–145)
Total Bilirubin: 0.3 mg/dL (ref 0.3–1.2)
Total Protein: 6.6 g/dL (ref 6.5–8.1)

## 2022-09-21 LAB — GLUCOSE, CAPILLARY
Glucose-Capillary: 159 mg/dL — ABNORMAL HIGH (ref 70–99)
Glucose-Capillary: 109 mg/dL — ABNORMAL HIGH (ref 70–99)
Glucose-Capillary: 154 mg/dL — ABNORMAL HIGH (ref 70–99)

## 2022-09-21 LAB — LACTATE DEHYDROGENASE: LDH: 152 U/L (ref 98–192)

## 2022-09-21 MED ORDER — LACTATED RINGERS IV BOLUS
500.0000 mL | Freq: Once | INTRAVENOUS | Status: AC
  Administered 2022-09-21: 500 mL via INTRAVENOUS

## 2022-09-21 NOTE — Progress Notes (Addendum)
[name] is a 42 y.o. D7O2423 at 40w4dad[facility] with SF with severe range BPs.   Hospital Day No: Day 10 (readmit s/p AMA d/c) Day 17 (initial admission)  S: Reports a restful night with active FM throughout. Discussed need for BP medication as pt refused labetalol again last night. Pt states she does not want to feel faint again and does not plan of taking the medication.Lengthy discussion on risks of seizure/stroke related to uncontrolled hypertension. Pt verbalizes understanding of risks and denies HA, visual changes, and RUQ pain.   O:    09/21/2022    4:49 AM 09/20/2022   11:32 PM 09/20/2022    7:32 PM  Vitals with BMI  Weight 283 lbs 1 oz    BMI 553.61   Systolic [id number] Diastolic 82 76 97  Pulse 99 95 103   Phys Exam: Gen: resting in bed, no signs of distress Chest/Lungs: unlabord  Abdomen: soft, gravid, nontender GU: neg for loss of fluid and vaginal bleeding EXT: 1+ edema, neg for pain, tenderness and cords NST has not been performed for the morning at time of exam  A/P: 42y.o. [id number] hypertension with superimposed pre-eclampsia w/ SF due to severe range BP requiring IV antihypertensives     -daily CBC abd CMP, T&S Q 72 hrs    -Labetalol 200 mg PO BID (pt has refused last 2 doses despite mild to moderate range BP values)    -inpatient mngt until delivery @ 34 wks    -S/P BMZ 09/06/22, NICU, and MFM consult    -GBS neg Fetal well-being    -Imaging 09/19/22 vertex/anterior placenta/ AFI 33.4    -new dx of polyhydramnios    -continue daily NST. Reactive NST 09/20/22    -BPP every Tues and Thu Pre-existing T2DM    -Metformin 500 mg with breakfast and '1000mg'$  with dinner, Semglee 8u qd    -fasting 2hr PP CBGs, cover w/ SSI (s/p 4u last night)    -carb modified diet, protein intake increased to 100 g/day Fibroid uterus    -Left 12.5 x 9.7 x 13 cm 08/09/2022, previously posterior 10.6 x 8.0 x 9.0 cm 04/18/2022 HSV 1 & 2     -Continue valtrex for suppressive therapy Depression     -Continue Zoloft '50mg'$  daily Plan for IOL on 09/24/22 if vertex or primary C/S if breech. Pt desires permanent sterilization   MFM Recommendations   - I have counseled the patient extensively about her medical problems with all questions answered - Continue inpatient management - HELLP labs to be repeat daily or more frequent (q6-8h) if blood pressure becomes elevated - 2-3x daily NST  - S/p BMZ and NICU consult - Continue Labetalol with titration as needed, goal BP < 140/90 - Delivery at 34 weeks or sooner if indicated - If the patient has any of the following delivery should be considered: Nonreassuring fetal heart tracing not responsive to intrauterine resuscitation, severe persistent maternal headache despite antihypertensive and analgesic treatment with no other identifiable cause other than preeclampsia, difficult to control blood pressure despite maximum doses of 1-2 antihypertensive medications, maternal labs consistent with preeclampsia with severe features.

## 2022-09-21 NOTE — Progress Notes (Signed)
Pt without complaints.  No leakage of fluid or VB.  Good FM  BP 110/66 (BP Location: Left Arm)   Pulse (!) 121   Temp 97.9 F (36.6 C) (Oral)   Resp 18   Ht '5\' 3"'$  (1.6 m)   Wt 128.4 kg   LMP  (LMP Unknown) Comment: ? June  SpO2 98%   BMI 50.14 kg/m   FHTS  reactive  Toco occ contractions  Pt in NAD CV RRR Lungs CTAB abd  Gravid soft and NT GU no vb EXt no calf tenderness Results for orders placed or performed during the hospital encounter of 09/12/22 (from the past 72 hour(s))  Glucose, capillary     Status: Abnormal   Collection Time: 09/18/22  4:34 PM  Result Value Ref Range   Glucose-Capillary 148 (H) 70 - 99 mg/dL    Comment: Glucose reference range applies only to samples taken after fasting for at least 8 hours.  Glucose, capillary     Status: Abnormal   Collection Time: 09/18/22  9:02 PM  Result Value Ref Range   Glucose-Capillary 145 (H) 70 - 99 mg/dL    Comment: Glucose reference range applies only to samples taken after fasting for at least 8 hours.  Type and screen [place]     Status: None   Collection Time: 09/19/22  4:04 AM  Result Value Ref Range   ABO/RH(D) A POS    Antibody Screen NEG    Sample Expiration      09/22/2022,2359 Performed at [facility], [place] [address], [place], [place] [address]   Comprehensive metabolic panel     Status: Abnormal   Collection Time: 09/19/22  4:08 AM  Result Value Ref Range   Sodium 133 (L) 135 - 145 mmol/L   Potassium 4.1 3.5 - 5.1 mmol/L   Chloride 102 98 - 111 mmol/L   CO2 22 22 - 32 mmol/L   Glucose, Bld 95 70 - 99 mg/dL    Comment: Glucose reference range applies only to samples taken after fasting for at least 8 hours.   BUN 11 6 - 20 mg/dL   Creatinine, Ser 0.67 0.44 - 1.00 mg/dL   Calcium 9.0 8.9 - 10.3 mg/dL   Total Protein 6.7 6.5 - 8.1 g/dL   Albumin 3.1 (L) 3.5 - 5.0 g/dL   AST 22 15 - 41 U/L   ALT 17 0 - 44 U/L   Alkaline Phosphatase 60 38 - 126 U/L   Total Bilirubin  0.1 (L) 0.3 - 1.2 mg/dL   GFR, Estimated >60 >60 mL/min    Comment: (NOTE) Calculated using the CKD-EPI Creatinine Equation (2021)    Anion gap 9 5 - 15    Comment: Performed at [facility] [address], [place], [place] [address]  CBC     Status: Abnormal   Collection Time: 09/19/22  4:08 AM  Result Value Ref Range   WBC 5.9 4.0 - 10.5 K/uL   RBC 3.72 (L) 3.87 - 5.11 MIL/uL   Hemoglobin 11.0 (L) 12.0 - 15.0 g/dL   HCT 33.1 (L) 36.0 - 46.0 %   MCV 89.0 80.0 - 100.0 fL   MCH 29.6 26.0 - 34.0 pg   MCHC 33.2 30.0 - 36.0 g/dL   RDW 12.8 11.5 - 15.5 %   Platelets 232 150 - 400 K/uL   nRBC 0.0 0.0 - 0.2 %    Comment: Performed at [facility], [place]  [address], [place], [place] [address]  Lactate dehydrogenase     Status: None   Collection Time: 09/19/22  4:08 AM  Result Value Ref Range   LDH 159 98 - 192 U/L    Comment: Performed at [facility], [place] [address] [place], [place] [address]  Glucose, capillary     Status: None   Collection Time: 09/19/22  5:58 AM  Result Value Ref Range   Glucose-Capillary 91 70 - 99 mg/dL    Comment: Glucose reference range applies only to samples taken after fasting for at least 8 hours.  Glucose, capillary     Status: None   Collection Time: 09/19/22 12:33 PM  Result Value Ref Range   Glucose-Capillary 96 70 - 99 mg/dL    Comment: Glucose reference range applies only to samples taken after fasting for at least 8 hours.  Glucose, capillary     Status: Abnormal   Collection Time: 09/19/22  3:22 PM  Result Value Ref Range   Glucose-Capillary 148 (H) 70 - 99 mg/dL    Comment: Glucose reference range applies only to samples taken after fasting for at least 8 hours.  Glucose, capillary     Status: Abnormal   Collection Time: 09/19/22  9:05 PM  Result Value Ref Range   Glucose-Capillary 137 (H) 70 - 99 mg/dL    Comment: Glucose reference range applies only to samples taken after fasting for at least 8 hours.   Comment 1 Notify  RN   Glucose, capillary     Status: None   Collection Time: 09/20/22  6:15 AM  Result Value Ref Range   Glucose-Capillary 95 70 - 99 mg/dL    Comment: Glucose reference range applies only to samples taken after fasting for at least 8 hours.  Comprehensive metabolic panel     Status: Abnormal   Collection Time: 09/20/22  6:24 AM  Result Value Ref Range   Sodium 134 (L) 135 - 145 mmol/L   Potassium 3.9 3.5 - 5.1 mmol/L   Chloride 102 98 - 111 mmol/L   CO2 22 22 - 32 mmol/L   Glucose, Bld 93 70 - 99 mg/dL    Comment: Glucose reference range applies only to samples taken after fasting for at least 8 hours.   BUN 12 6 - 20 mg/dL   Creatinine, Ser 0.66 0.44 - 1.00 mg/dL   Calcium 9.6 8.9 - 10.3 mg/dL   Total Protein 6.5 6.5 - 8.1 g/dL   Albumin 3.0 (L) 3.5 - 5.0 g/dL   AST 20 15 - 41 U/L   ALT 20 0 - 44 U/L   Alkaline Phosphatase 62 38 - 126 U/L   Total Bilirubin 0.3 0.3 - 1.2 mg/dL   GFR, Estimated >60 >60 mL/min    Comment: (NOTE) Calculated using the CKD-EPI Creatinine Equation (2021)    Anion gap 10 5 - 15    Comment: Performed at [facility] [address], [place], [place] [address]  CBC     Status: Abnormal   Collection Time: 09/20/22  6:24 AM  Result Value Ref Range   WBC 6.7 4.0 - 10.5 K/uL   RBC 3.91 3.87 - 5.11 MIL/uL   Hemoglobin 11.7 (L) 12.0 - 15.0 g/dL   HCT 34.2 (L) 36.0 - 46.0 %   MCV 87.5 80.0 - 100.0 fL   MCH 29.9 26.0 - 34.0 pg   MCHC 34.2 30.0 - 36.0 g/dL   RDW 12.8 11.5 - 15.5 %  Platelets 230 150 - 400 K/uL   nRBC 0.0 0.0 - 0.2 %    Comment: Performed at [facility], [place] [address], [place], [place] [address]  Lactate dehydrogenase     Status: None   Collection Time: 09/20/22  6:24 AM  Result Value Ref Range   LDH 145 98 - 192 U/L    Comment: Performed at [facility], [place] [address], [place], [place] [address]  Glucose, capillary     Status: Abnormal   Collection Time: 09/20/22 12:16 PM  Result Value Ref Range    Glucose-Capillary 127 (H) 70 - 99 mg/dL    Comment: Glucose reference range applies only to samples taken after fasting for at least 8 hours.  Glucose, capillary     Status: Abnormal   Collection Time: 09/20/22  4:22 PM  Result Value Ref Range   Glucose-Capillary 153 (H) 70 - 99 mg/dL    Comment: Glucose reference range applies only to samples taken after fasting for at least 8 hours.  Glucose, capillary     Status: Abnormal   Collection Time: 09/20/22  6:57 PM  Result Value Ref Range   Glucose-Capillary 120 (H) 70 - 99 mg/dL    Comment: Glucose reference range applies only to samples taken after fasting for at least 8 hours.  Comprehensive metabolic panel     Status: Abnormal   Collection Time: 09/21/22  4:41 AM  Result Value Ref Range   Sodium 132 (L) 135 - 145 mmol/L   Potassium 3.8 3.5 - 5.1 mmol/L   Chloride 101 98 - 111 mmol/L   CO2 20 (L) 22 - 32 mmol/L   Glucose, Bld 92 70 - 99 mg/dL    Comment: Glucose reference range applies only to samples taken after fasting for at least 8 hours.   BUN 12 6 - 20 mg/dL   Creatinine, Ser 0.66 0.44 - 1.00 mg/dL   Calcium 9.4 8.9 - 10.3 mg/dL   Total Protein 6.6 6.5 - 8.1 g/dL   Albumin 3.0 (L) 3.5 - 5.0 g/dL   AST 24 15 - 41 U/L   ALT 24 0 - 44 U/L   Alkaline Phosphatase 62 38 - 126 U/L   Total Bilirubin 0.3 0.3 - 1.2 mg/dL   GFR, Estimated >60 >60 mL/min    Comment: (NOTE) Calculated using the CKD-EPI Creatinine Equation (2021)    Anion gap 11 5 - 15    Comment: Performed at [facility] [address] [place], [place] [address]  CBC     Status: Abnormal   Collection Time: 09/21/22  4:41 AM  Result Value Ref Range   WBC 5.8 4.0 - 10.5 K/uL   RBC 3.84 (L) 3.87 - 5.11 MIL/uL   Hemoglobin 11.4 (L) 12.0 - 15.0 g/dL   HCT 34.2 (L) 36.0 - 46.0 %   MCV 89.1 80.0 - 100.0 fL   MCH 29.7 26.0 - 34.0 pg   MCHC 33.3 30.0 - 36.0 g/dL   RDW 13.0 11.5 - 15.5 %   Platelets 249 150 - 400 K/uL   nRBC 0.0 0.0 - 0.2 %    Comment:  Performed at [facility], [place] [address] [place], [place] [address]  Lactate dehydrogenase     Status: None   Collection Time: 09/21/22  4:41 AM  Result Value Ref Range   LDH 152 98 - 192 U/L    Comment: Performed at [facility], [place]  [address], [place], [place] [address]  Glucose, capillary     Status: Abnormal   Collection Time: 09/21/22 12:15 PM  Result Value Ref Range   Glucose-Capillary 159 (H) 70 - 99 mg/dL    Comment: Glucose reference range applies only to samples taken after fasting for at least 8 hours.    Assessment and Plan 56w4dCHTN with superimposed preeclampsia.  Pt declines antihypertensives.  She is tachycardic will give a fluid bolus and encouraged pt to drink .  Urine output is adequate Pt has completed BMZ course NST reassuring BS elevated however pt is refusing meds.  R&B reviewed.   Plan IOL at 34 weeks  Patient ID: [name] female   DOB: [birth year] 43y.o.   MRN: [id number]

## 2022-09-21 NOTE — Progress Notes (Signed)
S:  Pt was sleeping when I entered the room, pt appears to have sleep apnea with large snoring and intermittent apnea periods for 5 seconds which could be contributing to her day time fatigue and labored breaths. Pt woke and denies any complaints today. Pt stable in NAD, VSS.  Denies HA, visual changes, and RUQ pain. Reports active FM and denies feeling ctx/cramping.   O:     09/22/2022    4:09 AM 09/22/2022   12:12 AM 09/21/2022    7:38 PM  Vitals with BMI  Systolic 672 094 709  Diastolic 94 67 83  Pulse 628 103 103   CBG (last 3)  Recent Labs    09/21/22 1600 09/21/22 2025 09/22/22 0600  GLUCAP 109* 154* 85    Intake/Output Summary (Last 24 hours) at 09/22/2022 0709 Last data filed at 09/22/2022 0405 Gross per 24 hour  Intake 1200 ml  Output 2400 ml  Net -1200 ml  Phys exam: Gen: A/O x3, no distress Chest/Lungs: unlabored, CTA Bi-Lat Heart/Pulse: RRR  Abdomen: soft, gravid, nontender Neuro: DTRs 1+, clonus absent EXT: negative for swelling, neg for pain, tenderness, and cords  NST - 160/ moderate/ accels present 10x10 and 15x15/ decels absent, last night at 2100 TOCO - none traced  A/P 42 y.o. [id number] 60w5dLOS#10 CHTN with S/I pre-e with sever features   -Required IV antihypertensives on 1/3   - PCR was 0.4 on admission, other labs been unremarkable, continue daily labs.    -Current BP 147/94, asymptomatic, pt declining medication r/t not feeling good on it, r/b/a reviewed of stroke with severe range Bps.    -Increased Labetalol to 400 mg PO TID from 300 on 1/7, d/c'ed on 1/15 r/t hypotensive/vasovagal s/sx, pt been leaving hospital for walks, ordered USDS to r/o differentials resulted neg, low on differential is PE will get CT scan if pt has SOB, CP, coughing.     -S/P mag sulfate (dc'd 09/05/22 @ 1806)   -Wheelchair privileges granted.    -Plan for PCS at 34 weeks with BTL if PCS  done. Currently fetus vertex.   -NST reactive.   Type 2 DM    -HGA1C 7.5    -fasting  and 2 hr PP CBG   -elevated CBG PP in 140s    -SSI pt is taking sliding scale with meal around 4 unit required.     -Semglee 10 units, increased on  SQ QD to 12 on 1/8, decreased to 8 on 1/12, 1/15  pt declined to take r/t pt endorses "makes me feel bad:.Pt is taking 1/21 8 units    -Metformin 500 mf PO in the am and '1000mg'$  at night  Fetal well-being    -NST Q shift    -reactive    -Last [place]/3 anterior fundal placenta, AFI WNL 20.6, 4.5lbs, 77%, BPP 8/8.    -Last BPP 8/8 on 1/18 AFI 33.4, 8/8, anterior placenta new onset polyhydramnios.     -Now vertex.     -BMZ 1/3-1/4  Anxiety depression    -Sertraline 50 mg PO daily  Suspected sleep apnea    -Will discuss with DR DCharlesetta Garibaldiabout sleep study  MFM Recommendations   - Continue inpatient management - HELLP labs to be repeat daily or more frequent (q6-8h) if blood pressure becomes elevated - 2-3x daily NST  - S/p BMZ and NICU consult - Continue Labetalol with titration as needed, goal BP < 140/90 - Delivery at 34 weeks or sooner if indicated - If the  patient has any of the following delivery should be considered: Nonreassuring fetal heart tracing not responsive to intrauterine resuscitation, severe persistent maternal headache despite antihypertensive and analgesic treatment with no other identifiable cause other than preeclampsia, difficult to control blood pressure despite maximum doses of 1-2 antihypertensive medications, maternal labs consistent with preeclampsia with severe features.   DR Charlesetta Garibaldi updated on pt and plan of care and verbalizes agreement.   [place] CNM 09/22/22 7:09 AM

## 2022-09-21 NOTE — Plan of Care (Signed)
  Problem: Clinical Measurements: Goal: Complications related to the disease process, condition or treatment will be avoided or minimized Outcome: Progressing   Problem: Fluid Volume: Goal: Peripheral tissue perfusion will improve Outcome: Progressing   Problem: Clinical Measurements: Goal: Complications related to disease process, condition or treatment will be avoided or minimized Outcome: Progressing   Problem: Health Behavior/Discharge Planning: Goal: Ability to manage health-related needs will improve Outcome: Progressing   Problem: Clinical Measurements: Goal: Ability to maintain clinical measurements within normal limits will improve Outcome: Progressing Goal: Will remain free from infection Outcome: Progressing Goal: Diagnostic test results will improve Outcome: Progressing Goal: Respiratory complications will improve Outcome: Progressing Goal: Cardiovascular complication will be avoided Outcome: Progressing   Problem: Pain Managment: Goal: General experience of comfort will improve Outcome: Progressing   Problem: Safety: Goal: Ability to remain free from injury will improve Outcome: Progressing   Problem: Skin Integrity: Goal: Risk for impaired skin integrity will decrease Outcome: Progressing   Problem: Education: Goal: Ability to describe self-care measures that may prevent or decrease complications (Diabetes Survival Skills Education) will improve Outcome: Progressing Goal: Individualized Educational Video(s) Outcome: Progressing   Problem: Coping: Goal: Ability to adjust to condition or change in health will improve Outcome: Progressing   Problem: Fluid Volume: Goal: Ability to maintain a balanced intake and output will improve Outcome: Progressing   Problem: Health Behavior/Discharge Planning: Goal: Ability to identify and utilize available resources and services will improve Outcome: Progressing Goal: Ability to manage health-related needs will  improve Outcome: Progressing   Problem: Metabolic: Goal: Ability to maintain appropriate glucose levels will improve Outcome: Progressing   Problem: Nutritional: Goal: Progress toward achieving an optimal weight will improve Outcome: Progressing   Problem: Skin Integrity: Goal: Risk for impaired skin integrity will decrease Outcome: Progressing   Problem: Tissue Perfusion: Goal: Adequacy of tissue perfusion will improve Outcome: Progressing

## 2022-09-22 DIAGNOSIS — O409XX0 Polyhydramnios, unspecified trimester, not applicable or unspecified: Secondary | ICD-10-CM | POA: Diagnosis not present

## 2022-09-22 LAB — GLUCOSE, CAPILLARY
Glucose-Capillary: 85 mg/dL (ref 70–99)
Glucose-Capillary: 123 mg/dL — ABNORMAL HIGH (ref 70–99)
Glucose-Capillary: 110 mg/dL — ABNORMAL HIGH (ref 70–99)
Glucose-Capillary: 131 mg/dL — ABNORMAL HIGH (ref 70–99)

## 2022-09-22 LAB — COMPREHENSIVE METABOLIC PANEL
ALT: 29 U/L (ref 0–44)
AST: 34 U/L (ref 15–41)
Albumin: 3 g/dL — ABNORMAL LOW (ref 3.5–5.0)
Alkaline Phosphatase: 61 U/L (ref 38–126)
Anion gap: 9 (ref 5–15)
BUN: 13 mg/dL (ref 6–20)
CO2: 22 mmol/L (ref 22–32)
Calcium: 9.4 mg/dL (ref 8.9–10.3)
Chloride: 103 mmol/L (ref 98–111)
Creatinine, Ser: 0.74 mg/dL (ref 0.44–1.00)
GFR, Estimated: >60 mL/min (ref >60)
Glucose, Bld: 89 mg/dL (ref 70–99)
Potassium: 4 mmol/L (ref 3.5–5.1)
Sodium: 134 mmol/L — ABNORMAL LOW (ref 135–145)
Total Bilirubin: 0.3 mg/dL (ref 0.3–1.2)
Total Protein: 6.6 g/dL (ref 6.5–8.1)

## 2022-09-22 LAB — CBC
HCT: 35.3 % — ABNORMAL LOW (ref 36.0–46.0)
Hemoglobin: 11.8 g/dL — ABNORMAL LOW (ref 12.0–15.0)
MCH: 29.7 pg (ref 26.0–34.0)
MCHC: 33.4 g/dL (ref 30.0–36.0)
MCV: 88.9 fL (ref 80.0–100.0)
Platelets: 251 10*3/uL (ref 150–400)
RBC: 3.97 MIL/uL (ref 3.87–5.11)
RDW: 12.9 % (ref 11.5–15.5)
WBC: 5.7 10*3/uL (ref 4.0–10.5)
nRBC: 0 % (ref 0.0–0.2)

## 2022-09-22 LAB — LACTATE DEHYDROGENASE: LDH: 223 U/L — ABNORMAL HIGH (ref 98–192)

## 2022-09-22 LAB — TYPE AND SCREEN
ABO/RH(D): A POS
Antibody Screen: NEGATIVE

## 2022-09-22 MED ORDER — LACTATED RINGERS IV BOLUS
500.0000 mL | Freq: Once | INTRAVENOUS | Status: AC
  Administered 2022-09-22: 500 mL via INTRAVENOUS

## 2022-09-22 NOTE — Progress Notes (Signed)
Patient refused to take her scheduled Labetalol.

## 2022-09-22 NOTE — Plan of Care (Signed)
  Problem: Clinical Measurements: Goal: Complications related to the disease process, condition or treatment will be avoided or minimized Outcome: Progressing   Problem: Fluid Volume: Goal: Peripheral tissue perfusion will improve Outcome: Progressing   Problem: Clinical Measurements: Goal: Complications related to disease process, condition or treatment will be avoided or minimized Outcome: Progressing   Problem: Health Behavior/Discharge Planning: Goal: Ability to manage health-related needs will improve Outcome: Progressing   Problem: Clinical Measurements: Goal: Ability to maintain clinical measurements within normal limits will improve Outcome: Progressing Goal: Will remain free from infection Outcome: Progressing Goal: Diagnostic test results will improve Outcome: Progressing Goal: Respiratory complications will improve Outcome: Progressing Goal: Cardiovascular complication will be avoided Outcome: Progressing   Problem: Pain Managment: Goal: General experience of comfort will improve Outcome: Progressing   Problem: Safety: Goal: Ability to remain free from injury will improve Outcome: Progressing   Problem: Skin Integrity: Goal: Risk for impaired skin integrity will decrease Outcome: Progressing   Problem: Education: Goal: Ability to describe self-care measures that may prevent or decrease complications (Diabetes Survival Skills Education) will improve Outcome: Progressing Goal: Individualized Educational Video(s) Outcome: Progressing   Problem: Coping: Goal: Ability to adjust to condition or change in health will improve Outcome: Progressing   Problem: Fluid Volume: Goal: Ability to maintain a balanced intake and output will improve Outcome: Progressing   Problem: Health Behavior/Discharge Planning: Goal: Ability to identify and utilize available resources and services will improve Outcome: Progressing Goal: Ability to manage health-related needs will  improve Outcome: Progressing   Problem: Metabolic: Goal: Ability to maintain appropriate glucose levels will improve Outcome: Progressing   Problem: Nutritional: Goal: Progress toward achieving an optimal weight will improve Outcome: Progressing   Problem: Skin Integrity: Goal: Risk for impaired skin integrity will decrease Outcome: Progressing   Problem: Tissue Perfusion: Goal: Adequacy of tissue perfusion will improve Outcome: Progressing

## 2022-09-22 NOTE — Progress Notes (Signed)
Pt seen and examined. Agree with the plan of care.  Tachycardia improved.  Will give one more bolus.  Urine output is adequate

## 2022-09-23 ENCOUNTER — Inpatient Hospital Stay (HOSPITAL_BASED_OUTPATIENT_CLINIC_OR_DEPARTMENT_OTHER): Payer: No Typology Code available for payment source

## 2022-09-23 ENCOUNTER — Encounter (HOSPITAL_COMMUNITY)
Admission: RE | Admit: 2022-09-23 | Discharge: 2022-09-23 | Disposition: A | Discharge status: Home / Self Care | Payer: No Typology Code available for payment source | Source: Ambulatory Visit | Attending: Obstetrics and Gynecology | Admitting: Obstetrics and Gynecology

## 2022-09-23 DIAGNOSIS — O09523 Supervision of elderly multigravida, third trimester: Secondary | ICD-10-CM

## 2022-09-23 DIAGNOSIS — O24113 Pre-existing diabetes mellitus, type 2, in pregnancy, third trimester: Secondary | ICD-10-CM

## 2022-09-23 DIAGNOSIS — O113 Pre-existing hypertension with pre-eclampsia, third trimester: Secondary | ICD-10-CM

## 2022-09-23 DIAGNOSIS — O403XX0 Polyhydramnios, third trimester, not applicable or unspecified: Secondary | ICD-10-CM

## 2022-09-23 DIAGNOSIS — Z7984 Long term (current) use of oral hypoglycemic drugs: Secondary | ICD-10-CM

## 2022-09-23 DIAGNOSIS — O99213 Obesity complicating pregnancy, third trimester: Secondary | ICD-10-CM

## 2022-09-23 DIAGNOSIS — E669 Obesity, unspecified: Secondary | ICD-10-CM

## 2022-09-23 DIAGNOSIS — Z3A33 33 weeks gestation of pregnancy: Secondary | ICD-10-CM

## 2022-09-23 DIAGNOSIS — E119 Type 2 diabetes mellitus without complications: Secondary | ICD-10-CM

## 2022-09-23 LAB — COMPREHENSIVE METABOLIC PANEL
ALT: 40 U/L (ref 0–44)
AST: 31 U/L (ref 15–41)
Albumin: 3 g/dL — ABNORMAL LOW (ref 3.5–5.0)
Alkaline Phosphatase: 65 U/L (ref 38–126)
Anion gap: 10 (ref 5–15)
BUN: 14 mg/dL (ref 6–20)
CO2: 22 mmol/L (ref 22–32)
Calcium: 9.3 mg/dL (ref 8.9–10.3)
Chloride: 103 mmol/L (ref 98–111)
Creatinine, Ser: 0.78 mg/dL (ref 0.44–1.00)
GFR, Estimated: >60 mL/min (ref >60)
Glucose, Bld: 93 mg/dL (ref 70–99)
Potassium: 4 mmol/L (ref 3.5–5.1)
Sodium: 135 mmol/L (ref 135–145)
Total Bilirubin: <0.1 mg/dL — ABNORMAL LOW (ref 0.3–1.2)
Total Protein: 7 g/dL (ref 6.5–8.1)

## 2022-09-23 LAB — CBC
HCT: 33.9 % — ABNORMAL LOW (ref 36.0–46.0)
Hemoglobin: 11.6 g/dL — ABNORMAL LOW (ref 12.0–15.0)
MCH: 30.2 pg (ref 26.0–34.0)
MCHC: 34.2 g/dL (ref 30.0–36.0)
MCV: 88.3 fL (ref 80.0–100.0)
Platelets: 231 10*3/uL (ref 150–400)
RBC: 3.84 MIL/uL — ABNORMAL LOW (ref 3.87–5.11)
RDW: 13 % (ref 11.5–15.5)
WBC: 5.4 10*3/uL (ref 4.0–10.5)
nRBC: 0 % (ref 0.0–0.2)

## 2022-09-23 LAB — GLUCOSE, CAPILLARY
Glucose-Capillary: 102 mg/dL — ABNORMAL HIGH (ref 70–99)
Glucose-Capillary: 102 mg/dL — ABNORMAL HIGH (ref 70–99)
Glucose-Capillary: 144 mg/dL — ABNORMAL HIGH (ref 70–99)
Glucose-Capillary: 76 mg/dL (ref 70–99)

## 2022-09-23 LAB — LACTATE DEHYDROGENASE: LDH: 154 U/L (ref 98–192)

## 2022-09-23 MED ORDER — ONDANSETRON HCL 4 MG/2ML IJ SOLN
4.0000 mg | Freq: Four times a day (QID) | INTRAMUSCULAR | Status: DC | PRN
  Filled 2022-09-23: qty 2

## 2022-09-23 MED ORDER — LACTATED RINGERS IV SOLN: INTRAVENOUS | Status: DC

## 2022-09-23 MED ORDER — PROMETHAZINE HCL 25 MG RE SUPP
25.0000 mg | Freq: Two times a day (BID) | RECTAL | Status: DC | PRN
  Administered 2022-09-23: 25 mg via RECTAL
  Filled 2022-09-23: qty 1

## 2022-09-23 NOTE — Progress Notes (Signed)
Patient ID: [name], female   DOB: 04-Dec-1980, 42 y.o.   MRN: [id number] [name] is a 42 y.o. [id number] at 8w6dadmitted [facility] Pre-Eclampsia with SF.  Hospital Day No:  Day 12 (readmit s/p AMA d/c) Day 19 (initial admission)    Subjective: No complaints.  Denies HA, visual changes or abdominal pain.  Many questions regarding induction process.  Objective: BP 139/75 (BP Location: Right Arm)   Pulse 87   Temp 98.5 F (36.9 C) (Oral)   Resp 17   Ht '5\' 3"'$  (1.6 m)   Wt 129.6 kg   LMP  (LMP Unknown) Comment: ? June  SpO2 98%   BMI 50.63 kg/m  I/O last 3 completed shifts: In: 3620 [P.O.:3120; IV Piggyback:500] Out: 4100 [Urine:4100] No intake/output data recorded. CBGs 102-144  Physical Exam:  Gen: alert and no distress, sitting in chair Chest/Lungs: cta bilaterally  Heart/Pulse: RRR  Abdomen: soft, gravid, nontender Uterine fundus: soft, nontender Skin & Color: warm and dry  Neurological: AOx3, DTRs 1+ EXT: negative calf tenderness, edema trace to 1+  FHT:  FHR: 150 bpm, variability: moderate,  accelerations:  Present,  decelerations:  Absent UC:   irritability SVE:    deferred  BPP 8/8, cephalic, AFI 32  Labs: Lab Results  Component Value Date   WBC 5.4 09/23/2022   HGB 11.6 (L) 09/23/2022   HCT 33.9 (L) 09/23/2022   MCV 88.3 09/23/2022   PLT 231 09/23/2022    Assessment and Plan: has Latex allergy; Allergy or intolerance to drug--flagyl; Depression / anxiety; BMI 50.0-59.9, adult ([place]; Diabetes ([place]; Chronic hypertension with superimposed pre-eclampsia; [redacted] weeks gestation of pregnancy; NST (non-stress test) reactive; Preeclampsia; AMA (advanced maternal age) multigravida 329+ Fibroid uterus; HSV-2 seropositive; and Polyhydramnios on their problem list.  42y.o. [id number] 360w6dd[facility] with SF with severe range BPs although BPs have been much better controlled recently requiring decrease in BP medication.  Pt has  actually been refusing medication d/t an episode of syncope while getting BPP performed on Monday 09/16/22.   1.Chronic hypertension with superimposed pre-eclampsia w/ SF due to severe range BP requiring IV antihypertensives Daily PIH labs, T&S q72hr Labetalol 400 mg TID was dropping BPs so BP medication d/c'd after syncopal episode.  BPs increased again and labetalol was restarted at '200mg'$  BID.  Pt refused meds today. BMZ course completed 1/5 GBS negative Inpatient management until delivery at 34 wks or sooner if worsening maternal or fetal status S/p NICU and MFM consult Nst q shift Repeat BPP twice weekly, Mon and Thurs.  BPP 8/8 today with poly (AFI 32) and cephalic. 2.Pre-existing MD type 2  Metformin 500 mg with breakfast and '1000mg'$  with dinner, Semglee 8u qd Continue to monitor fasting 2hr PP CBGs, cover w/ SSI (s/p 4u last night).  CBGs under decent control. 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 Patient desires sterilization with bilateral salpingectomy. FHT cat 1.  Now that fetus is cephalic, plan IOL tomorrow.  Many questions answered today.  Will sign out to Dr. KuAlesia Richardsn the morning to check presentation and move pt to L&D as availability is allowed for IOL. 7.DVT prophylaxis - SCDs when  recumbent and RN notified to help remind patient.   MFM Recommendations   - I have counseled the patient extensively about her medical problems with all questions answered - Continue inpatient management - HELLP labs to be repeat daily or more frequent (q6-8h) if blood pressure becomes elevated - 2-3x daily NST  - S/p BMZ and NICU consult - Continue Labetalol with titration as  needed, goal BP < 140/90 - Delivery at 34 weeks or sooner if indicated - If the patient has any of the following delivery should be considered: Nonreassuring fetal heart tracing not responsive to intrauterine resuscitation, severe persistent maternal headache despite antihypertensive and analgesic treatment with no other identifiable cause other than preeclampsia, difficult to control blood pressure despite maximum doses of 1-2 antihypertensive medications, maternal labs consistent with preeclampsia with severe features.    [name] 09/23/2022, 8:33 AM

## 2022-09-24 ENCOUNTER — Encounter (HOSPITAL_COMMUNITY): Payer: Self-pay | Admitting: Obstetrics and Gynecology

## 2022-09-24 ENCOUNTER — Inpatient Hospital Stay (HOSPITAL_COMMUNITY): Payer: No Typology Code available for payment source | Admitting: Certified Registered"

## 2022-09-24 ENCOUNTER — Encounter (HOSPITAL_COMMUNITY)
Admission: AD | Disposition: A | Discharge status: Home / Self Care | Payer: Self-pay | Source: Home / Self Care | Attending: Obstetrics and Gynecology

## 2022-09-24 ENCOUNTER — Encounter (HOSPITAL_COMMUNITY)
Admission: AD | Discharge status: Against Medical Advice | Payer: Self-pay | Source: Home / Self Care | Attending: Obstetrics and Gynecology

## 2022-09-24 DIAGNOSIS — O9952 Diseases of the respiratory system complicating childbirth: Secondary | ICD-10-CM | POA: Diagnosis not present

## 2022-09-24 DIAGNOSIS — O321XX0 Maternal care for breech presentation, not applicable or unspecified: Secondary | ICD-10-CM | POA: Diagnosis not present

## 2022-09-24 DIAGNOSIS — J45909 Unspecified asthma, uncomplicated: Secondary | ICD-10-CM | POA: Diagnosis not present

## 2022-09-24 DIAGNOSIS — O1414 Severe pre-eclampsia complicating childbirth: Secondary | ICD-10-CM | POA: Diagnosis not present

## 2022-09-24 DIAGNOSIS — O2412 Pre-existing diabetes mellitus, type 2, in childbirth: Secondary | ICD-10-CM | POA: Diagnosis not present

## 2022-09-24 DIAGNOSIS — Z302 Encounter for sterilization: Secondary | ICD-10-CM

## 2022-09-24 DIAGNOSIS — Z3A34 34 weeks gestation of pregnancy: Secondary | ICD-10-CM

## 2022-09-24 DIAGNOSIS — O164 Unspecified maternal hypertension, complicating childbirth: Secondary | ICD-10-CM

## 2022-09-24 DIAGNOSIS — O320XX1 Maternal care for unstable lie, fetus 1: Secondary | ICD-10-CM | POA: Diagnosis not present

## 2022-09-24 LAB — CBC
HCT: 32.7 % — ABNORMAL LOW (ref 36.0–46.0)
HCT: 35.8 % — ABNORMAL LOW (ref 36.0–46.0)
Hemoglobin: 11.1 g/dL — ABNORMAL LOW (ref 12.0–15.0)
Hemoglobin: 11.7 g/dL — ABNORMAL LOW (ref 12.0–15.0)
MCH: 29.8 pg (ref 26.0–34.0)
MCH: 29 pg (ref 26.0–34.0)
MCHC: 33.9 g/dL (ref 30.0–36.0)
MCHC: 32.7 g/dL (ref 30.0–36.0)
MCV: 87.7 fL (ref 80.0–100.0)
MCV: 88.8 fL (ref 80.0–100.0)
Platelets: 223 10*3/uL (ref 150–400)
Platelets: 263 10*3/uL (ref 150–400)
RBC: 3.73 MIL/uL — ABNORMAL LOW (ref 3.87–5.11)
RBC: 4.03 MIL/uL (ref 3.87–5.11)
RDW: 12.9 % (ref 11.5–15.5)
RDW: 12.9 % (ref 11.5–15.5)
WBC: 4.7 10*3/uL (ref 4.0–10.5)
WBC: 4.9 10*3/uL (ref 4.0–10.5)
nRBC: 0 % (ref 0.0–0.2)
nRBC: 0 % (ref 0.0–0.2)

## 2022-09-24 LAB — COMPREHENSIVE METABOLIC PANEL
ALT: 61 U/L — ABNORMAL HIGH (ref 0–44)
ALT: 86 U/L — ABNORMAL HIGH (ref 0–44)
AST: 42 U/L — ABNORMAL HIGH (ref 15–41)
AST: 54 U/L — ABNORMAL HIGH (ref 15–41)
Albumin: 3 g/dL — ABNORMAL LOW (ref 3.5–5.0)
Albumin: 3.3 g/dL — ABNORMAL LOW (ref 3.5–5.0)
Alkaline Phosphatase: 67 U/L (ref 38–126)
Alkaline Phosphatase: 71 U/L (ref 38–126)
Anion gap: 9 (ref 5–15)
Anion gap: 13 (ref 5–15)
BUN: 11 mg/dL (ref 6–20)
BUN: 9 mg/dL (ref 6–20)
CO2: 23 mmol/L (ref 22–32)
CO2: 20 mmol/L — ABNORMAL LOW (ref 22–32)
Calcium: 9.7 mg/dL (ref 8.9–10.3)
Calcium: 9.4 mg/dL (ref 8.9–10.3)
Chloride: 103 mmol/L (ref 98–111)
Chloride: 102 mmol/L (ref 98–111)
Creatinine, Ser: 0.76 mg/dL (ref 0.44–1.00)
Creatinine, Ser: 0.69 mg/dL (ref 0.44–1.00)
GFR, Estimated: >60 mL/min (ref >60)
GFR, Estimated: >60 mL/min (ref >60)
Glucose, Bld: 107 mg/dL — ABNORMAL HIGH (ref 70–99)
Glucose, Bld: 123 mg/dL — ABNORMAL HIGH (ref 70–99)
Potassium: 4.3 mmol/L (ref 3.5–5.1)
Potassium: 4 mmol/L (ref 3.5–5.1)
Sodium: 135 mmol/L (ref 135–145)
Sodium: 135 mmol/L (ref 135–145)
Total Bilirubin: <0.1 mg/dL — ABNORMAL LOW (ref 0.3–1.2)
Total Bilirubin: <0.1 mg/dL — ABNORMAL LOW (ref 0.3–1.2)
Total Protein: 6.8 g/dL (ref 6.5–8.1)
Total Protein: 6.8 g/dL (ref 6.5–8.1)

## 2022-09-24 LAB — GLUCOSE, CAPILLARY
Glucose-Capillary: 88 mg/dL (ref 70–99)
Glucose-Capillary: 84 mg/dL (ref 70–99)
Glucose-Capillary: 194 mg/dL — ABNORMAL HIGH (ref 70–99)
Glucose-Capillary: 72 mg/dL (ref 70–99)
Glucose-Capillary: 108 mg/dL — ABNORMAL HIGH (ref 70–99)
Glucose-Capillary: 82 mg/dL (ref 70–99)

## 2022-09-24 LAB — LACTATE DEHYDROGENASE: LDH: 154 U/L (ref 98–192)

## 2022-09-24 SURGERY — Surgical Case
Anesthesia: Spinal | Laterality: Bilateral

## 2022-09-24 SURGERY — Surgical Case
Anesthesia: Regional

## 2022-09-24 MED ORDER — FENTANYL CITRATE (PF) 100 MCG/2ML IJ SOLN
INTRAMUSCULAR | Status: AC
  Filled 2022-09-24: qty 2

## 2022-09-24 MED ORDER — DEXAMETHASONE SODIUM PHOSPHATE 10 MG/ML IJ SOLN
INTRAMUSCULAR | Status: DC | PRN
  Administered 2022-09-24: 4 mg via INTRAVENOUS

## 2022-09-24 MED ORDER — MORPHINE SULFATE (PF) 0.5 MG/ML IJ SOLN
INTRAMUSCULAR | Status: DC | PRN
  Administered 2022-09-24: 150 ug via INTRATHECAL

## 2022-09-24 MED ORDER — KETOROLAC TROMETHAMINE 30 MG/ML IJ SOLN
30.0000 mg | Freq: Once | INTRAMUSCULAR | Status: AC | PRN
  Administered 2022-09-25: 30 mg via INTRAVENOUS

## 2022-09-24 MED ORDER — MISOPROSTOL 25 MCG QUARTER TABLET
25.0000 ug | ORAL_TABLET | Freq: Once | ORAL | Status: AC
  Administered 2022-09-24: 25 ug via VAGINAL
  Filled 2022-09-24: qty 1

## 2022-09-24 MED ORDER — SODIUM CHLORIDE 0.9% FLUSH: 3.0000 mL | INTRAVENOUS | Status: DC | PRN

## 2022-09-24 MED ORDER — ONDANSETRON HCL 4 MG/2ML IJ SOLN
4.0000 mg | Freq: Three times a day (TID) | INTRAMUSCULAR | Status: DC | PRN
  Administered 2022-09-25: 4 mg via INTRAVENOUS

## 2022-09-24 MED ORDER — CEFAZOLIN IN SODIUM CHLORIDE 3-0.9 GM/100ML-% IV SOLN
3.0000 g | INTRAVENOUS | Status: AC
  Administered 2022-09-24: 3 g via INTRAVENOUS
  Filled 2022-09-24: qty 100

## 2022-09-24 MED ORDER — ONDANSETRON HCL 4 MG/2ML IJ SOLN
INTRAMUSCULAR | Status: DC | PRN
  Administered 2022-09-24: 4 mg via INTRAVENOUS

## 2022-09-24 MED ORDER — MEPERIDINE HCL 25 MG/ML IJ SOLN: 6.2500 mg | INTRAMUSCULAR | Status: DC | PRN

## 2022-09-24 MED ORDER — MISOPROSTOL 50MCG HALF TABLET
50.0000 ug | ORAL_TABLET | Freq: Once | ORAL | Status: AC
  Administered 2022-09-24: 50 ug via ORAL
  Filled 2022-09-24: qty 1

## 2022-09-24 MED ORDER — LACTATED RINGERS IV SOLN: INTRAVENOUS | Status: DC | PRN

## 2022-09-24 MED ORDER — SOD CITRATE-CITRIC ACID 500-334 MG/5ML PO SOLN
30.0000 mL | ORAL | Status: AC
  Administered 2022-09-24: 30 mL via ORAL
  Filled 2022-09-24: qty 30

## 2022-09-24 MED ORDER — SODIUM CHLORIDE 0.9 % IV SOLN: INTRAVENOUS | Status: DC | PRN

## 2022-09-24 MED ORDER — MISOPROSTOL 25 MCG QUARTER TABLET
25.0000 ug | ORAL_TABLET | Freq: Once | ORAL | Status: DC
  Filled 2022-09-24: qty 1

## 2022-09-24 MED ORDER — DIPHENHYDRAMINE HCL 50 MG/ML IJ SOLN
12.5000 mg | INTRAMUSCULAR | Status: DC | PRN
  Administered 2022-09-25: 12.5 mg via INTRAVENOUS

## 2022-09-24 MED ORDER — PHENYLEPHRINE HCL-NACL 20-0.9 MG/250ML-% IV SOLN
INTRAVENOUS | Status: DC | PRN
  Administered 2022-09-24: 60 ug/min via INTRAVENOUS

## 2022-09-24 MED ORDER — SODIUM CHLORIDE 0.9 % IR SOLN
Status: DC | PRN
  Administered 2022-09-24 – 2022-09-25 (×3): 1

## 2022-09-24 MED ORDER — OXYTOCIN-SODIUM CHLORIDE 30-0.9 UT/500ML-% IV SOLN
INTRAVENOUS | Status: DC | PRN
  Administered 2022-09-24: 30 [IU] via INTRAVENOUS

## 2022-09-24 MED ORDER — FENTANYL CITRATE (PF) 100 MCG/2ML IJ SOLN
INTRAMUSCULAR | Status: DC | PRN
  Administered 2022-09-24: 15 ug via INTRATHECAL

## 2022-09-24 MED ORDER — MAGNESIUM SULFATE BOLUS VIA INFUSION
4.0000 g | Freq: Once | INTRAVENOUS | Status: AC
  Administered 2022-09-24: 4 g via INTRAVENOUS
  Filled 2022-09-24: qty 1000

## 2022-09-24 MED ORDER — MAGNESIUM SULFATE 40 GM/1000ML IV SOLN
2.0000 g/h | INTRAVENOUS | Status: AC
  Administered 2022-09-25: 2 g/h via INTRAVENOUS
  Filled 2022-09-24 (×2): qty 1000

## 2022-09-24 MED ORDER — NALOXONE HCL 4 MG/10ML IJ SOLN: 1.0000 ug/kg/h | INTRAVENOUS | Status: DC | PRN

## 2022-09-24 MED ORDER — TERBUTALINE SULFATE 1 MG/ML IJ SOLN: 0.2500 mg | Freq: Once | INTRAMUSCULAR | Status: DC | PRN

## 2022-09-24 MED ORDER — MISOPROSTOL 50MCG HALF TABLET: 50.0000 ug | ORAL_TABLET | Freq: Once | ORAL | Status: DC

## 2022-09-24 MED ORDER — BUPIVACAINE IN DEXTROSE 0.75-8.25 % IT SOLN
INTRATHECAL | Status: DC | PRN
  Administered 2022-09-24: 1.6 mL via INTRATHECAL

## 2022-09-24 MED ORDER — SCOPOLAMINE 1 MG/3DAYS TD PT72: 1.0000 | MEDICATED_PATCH | Freq: Once | TRANSDERMAL | Status: DC

## 2022-09-24 MED ORDER — DIPHENHYDRAMINE HCL 25 MG PO CAPS: 25.0000 mg | ORAL_CAPSULE | ORAL | Status: DC | PRN

## 2022-09-24 MED ORDER — INSULIN ASPART 100 UNIT/ML IJ SOLN: 0.0000 [IU] | INTRAMUSCULAR | Status: DC

## 2022-09-24 MED ORDER — NALOXONE HCL 0.4 MG/ML IJ SOLN: 0.4000 mg | INTRAMUSCULAR | Status: DC | PRN

## 2022-09-24 MED ORDER — MORPHINE SULFATE (PF) 0.5 MG/ML IJ SOLN
INTRAMUSCULAR | Status: AC
  Filled 2022-09-24: qty 10

## 2022-09-24 MED ORDER — LACTATED RINGERS IV SOLN: INTRAVENOUS | Status: DC

## 2022-09-24 MED ORDER — PHENYLEPHRINE HCL (PRESSORS) 10 MG/ML IV SOLN
INTRAVENOUS | Status: DC | PRN
  Administered 2022-09-24 (×3): 80 ug via INTRAVENOUS

## 2022-09-24 MED ORDER — MISOPROSTOL 25 MCG QUARTER TABLET
25.0000 ug | ORAL_TABLET | ORAL | Status: DC | PRN
  Administered 2022-09-24: 25 ug via VAGINAL
  Filled 2022-09-24: qty 1

## 2022-09-24 MED ORDER — STERILE WATER FOR IRRIGATION IR SOLN
Status: DC | PRN
  Administered 2022-09-24: 1

## 2022-09-24 SURGICAL SUPPLY — 40 items
CHLORAPREP W/TINT 26 (MISCELLANEOUS) ×4 IMPLANT
CLAMP UMBILICAL CORD (MISCELLANEOUS) ×2 IMPLANT
CLOTH BEACON ORANGE TIMEOUT ST (SAFETY) ×2 IMPLANT
DERMABOND ADVANCED .7 DNX12 (GAUZE/BANDAGES/DRESSINGS) IMPLANT
DRAPE C SECTION CLR SCREEN (DRAPES) ×2 IMPLANT
DRESSING PREVENA PLUS CUSTOM (GAUZE/BANDAGES/DRESSINGS) ×1 IMPLANT
DRSG OPSITE POSTOP 4X10 (GAUZE/BANDAGES/DRESSINGS) ×2 IMPLANT
DRSG PREVENA PLUS CUSTOM (GAUZE/BANDAGES/DRESSINGS) ×2
ELECT REM PT RETURN 9FT ADLT (ELECTROSURGICAL) ×2
ELECTRODE REM PT RTRN 9FT ADLT (ELECTROSURGICAL) ×2 IMPLANT
EXTENDER TRAXI PANNICULUS (MISCELLANEOUS) ×1 IMPLANT
EXTRACTOR VACUUM KIWI (MISCELLANEOUS) ×2 IMPLANT
GLOVE BIOGEL PI IND STRL 7.0 (GLOVE) ×4 IMPLANT
GLOVE SURG SS PI 6.5 STRL IVOR (GLOVE) ×2 IMPLANT
GOWN STRL REUS W/TWL LRG LVL3 (GOWN DISPOSABLE) ×4 IMPLANT
KIT ABG SYR 3ML LUER SLIP (SYRINGE) IMPLANT
LIGASURE IMPACT 36 18CM CVD LR (INSTRUMENTS) ×1 IMPLANT
MAT PREVALON FULL STRYKER (MISCELLANEOUS) ×1 IMPLANT
NDL HYPO 25X5/8 SAFETYGLIDE (NEEDLE) IMPLANT
NDL KEITH (NEEDLE) ×1 IMPLANT
NEEDLE HYPO 25X5/8 SAFETYGLIDE (NEEDLE) IMPLANT
NEEDLE KEITH (NEEDLE) ×2 IMPLANT
NS IRRIG 1000ML POUR BTL (IV SOLUTION) ×2 IMPLANT
PACK C SECTION WH (CUSTOM PROCEDURE TRAY) ×2 IMPLANT
PAD OB MATERNITY 4.3X12.25 (PERSONAL CARE ITEMS) ×2 IMPLANT
RETRACTOR TRAXI PANNICULUS (MISCELLANEOUS) ×1 IMPLANT
RTRCTR C-SECT PINK 25CM LRG (MISCELLANEOUS) ×2 IMPLANT
SUT CHROMIC 1 CTX 36 (SUTURE) IMPLANT
SUT CHROMIC 2 0 CT 1 (SUTURE) ×2 IMPLANT
SUT PLAIN 1 NONE 54 (SUTURE) IMPLANT
SUT PLAIN 2 0 (SUTURE)
SUT PLAIN 2 0 XLH (SUTURE) ×1 IMPLANT
SUT PLAIN ABS 2-0 CT1 27XMFL (SUTURE) IMPLANT
SUT VIC AB 0 CTX 36 (SUTURE) ×4
SUT VIC AB 0 CTX36XBRD ANBCTRL (SUTURE) ×3 IMPLANT
SUT VIC AB 1 CTX 36 (SUTURE) ×4
SUT VIC AB 1 CTX36XBRD ANBCTRL (SUTURE) ×4 IMPLANT
TOWEL OR 17X24 6PK STRL BLUE (TOWEL DISPOSABLE) ×2 IMPLANT
TRAY FOLEY W/BAG SLVR 14FR LF (SET/KITS/TRAYS/PACK) ×2 IMPLANT
WATER STERILE IRR 1000ML POUR (IV SOLUTION) ×3 IMPLANT

## 2022-09-24 NOTE — Progress Notes (Signed)
Tasmine Hipwell is a 42 y.o. G5P4004 at 41w0dtransferred to L & D for induction of labor for  Preeclampsia with severe features  Subjective: Patient feels some contractions.  She has no other complaints.    Objective: BP (!) 157/96   Pulse 92   Temp 98.4 F (36.9 C) (Axillary)   Resp 18   Ht '5\' 3"'$  (1.6 m)   Wt 129.6 kg   LMP  (LMP Unknown) Comment: ? June  SpO2 98%   BMI 50.63 kg/m  I/O last 3 completed shifts: In: 714.4 [I.V.:714.4] Out: 1910 [Urine:1910] No intake/output data recorded.  FHT:  FHR: 150 bpm, variability: moderate,  accelerations:  Present,  decelerations:  Absent UC:   irregular, every 2 to 3 minutes SVE:   Dilation: Fingertip Effacement (%): Thick Station: Ballotable Exam by:: JTommye Standard RN  Labs: Lab Results  Component Value Date   WBC 4.9 09/24/2022   HGB 11.7 (L) 09/24/2022   HCT 35.8 (L) 09/24/2022   MCV 88.8 09/24/2022   PLT 263 09/24/2022    Assessment / Plan: 42y.o. [id number] 339w0dransferred to Labor and delivery for induction of labor for preeclampsia with severe features, patient also with with pre gestational DM on medication, depression and on medication, BMI 50.    Labor: S/p 1 set buccal and vaginal cytotec, then 1 vaginal cytotec, for cervical reassessment at about 8 pm.      Preeclampsia:  no signs or symptoms of toxicity. Defer magnesium sulfate per patient's request.  F/u repeat preeclampsia labs this evening.  Fetal Wellbeing:  Category I Pain Control:  Labor support without medications as per patient's preference.  I/D:   GBS negative.  Anticipated MOD:  NSVD. EmArchie EndoMD 09/24/2022.

## 2022-09-24 NOTE — Progress Notes (Signed)
IV team at bedside to get IV access.

## 2022-09-24 NOTE — Progress Notes (Signed)
Patient ID: Gayleen Sholtz, female   DOB: 06-17-1981, 42 y.o.   MRN: [id number]  Reviewed recent blood tests and patient's blood pressures:     09/24/2022    7:34 PM 09/24/2022    6:26 PM 09/24/2022    5:30 PM  Vitals with BMI  Systolic 737 106 269  Diastolic 96 485 86  Pulse 92 95 91       Latest Ref Rng & Units 09/24/2022    6:01 PM 09/24/2022    5:36 AM 09/23/2022    4:58 AM  CBC  WBC 4.0 - 10.5 K/uL 4.9  4.7  5.4   Hemoglobin 12.0 - 15.0 g/dL 11.7  11.1  11.6   Hematocrit 36.0 - 46.0 % 35.8  32.7  33.9   Platelets 150 - 400 K/uL 263  223  231        Latest Ref Rng & Units 09/24/2022    6:01 PM 09/24/2022    5:36 AM 09/23/2022    4:58 AM  CMP  Glucose 70 - 99 mg/dL 123  107  93   BUN 6 - 20 mg/dL '9  11  14   '$ Creatinine 0.44 - 1.00 mg/dL 0.69  0.76  0.78   Sodium 135 - 145 mmol/L 135  135  135   Potassium 3.5 - 5.1 mmol/L 4.0  4.3  4.0   Chloride 98 - 111 mmol/L 102  103  103   CO2 22 - 32 mmol/L '20  23  22   '$ Calcium 8.9 - 10.3 mg/dL 9.4  9.7  9.3   Total Protein 6.5 - 8.1 g/dL 6.8  6.8  7.0   Total Bilirubin 0.3 - 1.2 mg/dL <0.1  <0.1  <0.1   Alkaline Phos 38 - 126 U/L 71  67  65   AST 15 - 41 U/L 54  42  31   ALT 0 - 44 U/L 86  61  40     I called patient and recommended start of  magnesium sulfate for seizure prophylaxis and patient agrees to proceed with magnesium sulfate at this time. Will order 4g IV load then 2 grams/hr.  Dr. Waymon Amato 09/24/2022.

## 2022-09-24 NOTE — Progress Notes (Signed)
[name] is a 42 y.o. Q6P6195 at 77w0dadmitted for induction of labor for preeclampsia with severe features  Subjective:  Patient had some nausea and vomiting last night but none this morning. She denies vision changes/chest pain/shortness of breath/upper abdominal pain.    Objective: BP (!) 153/108   Pulse 95   Temp 98.4 F (36.9 C) (Axillary)   Resp 18   Ht '5\' 3"'$  (1.6 m)   Wt 129.6 kg   LMP  (LMP Unknown) Comment: ? June  SpO2 98%   BMI 50.63 kg/m  I/O last 3 completed shifts: In: 714.4 [I.V.:714.4] Out: 1910 [Urine:1910] No intake/output data recorded.  FHT:  FHR: 150 bpm, variability: moderate,  accelerations:  Present,  decelerations:  Absent UC:   irregular, every 3 to 4 minutes SVE:   Closed/thick/posterior. Bedside ultrasound: cephalic presentation.   Labs: Lab Results  Component Value Date   WBC 4.9 09/24/2022   HGB 11.7 (L) 09/24/2022   HCT 35.8 (L) 09/24/2022   MCV 88.8 09/24/2022   PLT 263 09/24/2022   Assessment / Plan: 42y.o. [id number] 364w0dransferred to Labor and delivery for induction of labor for preeclampsia with severe features, patient also with with pre gestational DM on medication, depression and on medication, BMI 50.   Labor: Will start buccal and vaginal cytotec for induction of labor.  I discussed with patient risks, benefits and alternatives of labor induction including risks of cesarean delivery.  We discussed risks of induction agents including effects on fetal heart beat, contraction pattern and need for close monitoring.  Patient expressed understanding of all this and desired to proceed with the induction.    Preeclampsia:  no signs or symptoms of toxicity. Defer magnesium sulfate per patient's request.  Will repeat preeclampsia labs this evening.  Fetal Wellbeing:  Category I Pain Control:  Labor support without medications as per patient's preference.  I/D:   GBS negative.  Anticipated MOD:  NSVD  EmArchie EndoMD 09/24/2022.

## 2022-09-24 NOTE — Progress Notes (Addendum)
[name] is a 42 y.o. G5P4004 at 60w0dadmitted for induction of labor for preeclampsia with severe features,  Subjective:  Patient feels contractions. She desires a cesarean delivery for breech presentation and bilateral tubal ligation for permanent sterilization.    Objective: BP (!) 152/90   Pulse 86   Temp 99 F (37.2 C) (Axillary)   Resp 19   Ht '5\' 3"'$  (1.6 m)   Wt 129.6 kg   LMP  (LMP Unknown) Comment: ? June  SpO2 98%   BMI 50.63 kg/m  I/O last 3 completed shifts: In: 714.4 [I.V.:714.4] Out: 1910 [Urine:1910] No intake/output data recorded.  FHT:  FHR: 140 bpm, variability: moderate,  accelerations:  Present,  decelerations:  Absent UC:   irregular, every 10 minutes SVE:   Dilation: 2 Effacement (%): Thick Station: Ballotable Exam by:: AConsuela Mimes RN  Labs: Lab Results  Component Value Date   WBC 4.9 09/24/2022   HGB 11.7 (L) 09/24/2022   HCT 35.8 (L) 09/24/2022   MCV 88.8 09/24/2022   PLT 263 09/24/2022    Assessment / Plan: 42y.o. G5P4004 at 328w0ddmitted for induction of labor for preeclampsia with severe features, with unstable lie, breech presentation,management options discussed and patient desires to proceed with a cesarean delivery and bilateral tubal ligation via bilateral complete salpingectomy. She declines external cephalic version as an option.    Labor:  Will proceed with cesarean delivery.  Discussed with patient risks, benefits and alternatives of cesarean section including risks of heavy bleeding, infection, damage to other organs, possible need for additional surgery or procedures. In case of need for a blood transfusion patient would be agreeable to it. Patient plans to use bilateral tubal ligation via complete bilateral salpingectomy for sterilization.  We discussed risks, benefits and alternatives of postpartum tubal ligation including but not limited to risks of bleeding, infection, damage to organs.  She also understood the risk of  tubal regret but she states she is 100% sure she did not want any more children.  We discussed that complete removal of both tubes will ensure complete sterilization but as with every procedure it is not 100% guaranteed.  She understood there were other kinds of birth control such as pills, patches, IUDs, vaginal rings and depo provera which were temporary but she did not desire them.  She understood there was also an option of female sterilization but she did not desire that option either.  She did not desire partial salpingectomy.  All her questions were answered and she was consented for the procedure.   Preeclampsia:  With severe features. Magnesium sulfate for seizure prophylaxis. S/p IV labetalol x 2 doses.  Fetal Wellbeing:  Category I Pain Control:   Spinal anesthesia for cesarean delivery.  I/D:   GBS Negative.  Anticipated MOD:   Cesarean delivery.   EmArchie EndoMD 09/24/2022, 10:18 PM

## 2022-09-24 NOTE — Anesthesia Procedure Notes (Signed)
Spinal  Patient location during procedure: OR Start time: 09/24/2022 11:01 PM End time: 09/24/2022 11:03 PM Staffing Performed: anesthesiologist  Anesthesiologist: Darral Dash, DO Performed by: Darral Dash, DO Authorized by: Darral Dash, DO   Preanesthetic Checklist Completed: patient identified, IV checked, site marked, risks and benefits discussed, surgical consent, monitors and equipment checked, pre-op evaluation and timeout performed Spinal Block Patient position: sitting Prep: DuraPrep Patient monitoring: heart rate, cardiac monitor, continuous pulse ox and blood pressure Approach: midline Location: L3-4 Injection technique: single-shot Needle Needle type: Pencan  Needle gauge: 24 G Needle length: 10 cm Assessment Events: CSF return Additional Notes Patient identified. Risks/Benefits/Options discussed with patient including but not limited to bleeding, infection, nerve damage, paralysis, failed block, incomplete pain control, headache, blood pressure changes, nausea, vomiting, reactions to medications, itching and postpartum back pain. Confirmed with bedside nurse the patient's most recent platelet count. Confirmed with patient that they are not currently taking any anticoagulation, have any bleeding history or any family history of bleeding disorders. Patient expressed understanding and wished to proceed. All questions were answered. Sterile technique was used throughout the entire procedure. Please see nursing notes for vital signs. Warning signs of high block given to the patient including shortness of breath, tingling/numbness in hands, complete motor block, or any concerning symptoms with instructions to call for help. Patient was given instructions on fall risk and not to get out of bed. All questions and concerns addressed with instructions to call with any issues or inadequate analgesia.

## 2022-09-24 NOTE — Progress Notes (Signed)
Offered to give patient scheduled PO labetalol since IV access was lost and waiting on IV team for IV access. Educated patient on importance of medication and lowering BP. Provider at bedside. Patient declined at this time.

## 2022-09-24 NOTE — Op Note (Incomplete)
Patient: [name] DOB: 03-Sep-1980 MRN:  [id number]  DATE OF SURGERY:  09/24/2022  PREOP DIAGNOSIS:  1. 34 week 0 day EGA IUP. 2.  Induction of labor for Preeclampsia with severe features.  3. Unstable lie with now breech presentation.  4. Multiparous patient desiring permanent sterilization.  5. BMI 50  POSTOP DIAGNOSIS: Same as above.    PROCEDURE:  1. Primary low uterine segment transverse cesarean section via Pfannenstiel incision.  2. Postpartum bilateral tubal ligation via bilateral complete salpingectomy.   SURGEON: Dr.  Waymon Amato.   ASSISTANT:Dr. Gifford Shave, OB fellow.    ANESTHESIA: Spinal, Dr. Gloris Manchester, Belenda Cruise.  COMPLICATIONS: None.   FINDINGS: Viable female infant in breech presentation, * position, weight * lbs * oz, Apgar scores of  * and *. Normal uterus, normal bilateral fallopian tubes, normal bilateral ovaries.      EBL:   * cc  IV FLUID:  * cc LR   URINE OUTPUT: * cc clear urine  INDICATIONS:  42 y/o G5P4004 who was found to have unstable lie with breech presentation while indergoing induction of labor at 34 weeks for preeclampsia with severe features. She desires a cesarean delivery and postpartum bilateral tubal ligation.   PROCEDURE:   Informed consent was obtained from the patient to undergo the procedures. She was taken to the operating room where her spinal anesthesia was found to be adequate. A traxi abdominal pannus retractor was applied.  She was prepped and draped in the usual sterile fashion and a Foley catheter was placed. She received 3 grams IV ancef preoperatively. A Pfannenstiel incision was made with the scalpel and the incision was extended through the subcutaneous layer and also the fascia with the bovie. Small perforators in the subcutaneous layer were contained with the Bovie. The fascia was nicked in the midline and then was further separated from the rectus muscles bilaterally using Mayo scissors. Kochers were placed inferiorly  and then superiorly to allow further separation of fascia from the rectus muscles.  The peritoneal cavity was entered sharply with scissors.  The Alexis retractor was placed in. The vesicouterine fold dissection was created using Metzenbaum scissors.   The uterus was incised with a scalpel and the incision extended bluntly bilaterally and with bandage scissors.   Membranes were ruptured and moderate clear amniotic fluid was noted. The motions to deliver the breech presenting fetus were performed atraumatically: The sacrum was brought to the incision and delivered. The feet and legs were then delivered.  Gentle traction was applied on the hips to deliver the trunk.  Baby was rotated to its left side and right arm was delivered.  Baby was then rotated to its right side and the left arm was delivered.  Maxillary pressure was applied and abdominal pressure applied to deliver the head.  She delivered a viable female infant, apgar scores 9, 9.  The cord was clamped and cut after 1 minute. Cord blood was collected.    The uterus was not exteriorized.  The edges of the uterus was grasped with Allis clamps. The placenta was delivered with gentle traction on the umbilical cord. The uterus was cleared of clots and debris with a lap.  The uterine incision was closed with #1 Vicryl in a running locked stitch. An imbricating layer of the same stitch was placed over the initial closure.  A small area that bled on the left side was contained with figure of 8 stitch.  Irrigation was applied and suctioned out. Excellent hemostasis was noted  over the incision.  The left  fallopian tube was then identified, brought to the incision and followed up to the fimbria end.    The hand held Ligasure impact was used to completely transect the fallopian tube from the mesosalpinx and uterine cornua.  Excellent hemostasis was noted on the remaining pedicles.  A similar procedure was done to remove the right fallopian tube.  Both uterine cornua  and remaining mesosalpinx were hemostatic.  Irrigation was applied and suctioned out.  The uterine incision was noted to be hemostatic.  The muscles and peritoneum were then reapproximated using chromic suture.  Fascia was closed using 0 Vicryl in a running stitch. The subcutaneous layer was irrigated and suctioned out. Small perforators were contained with the bovie.  The subcutaneous layer was closed using 1-0 plain in interrupted stitches. The skin was closed using 4-0 Vicryl on the Movico needle.  Benzocaine and steri strips were applied.  Prevena dressing was then applied. The patient was then cleaned and she was taken to the recovery room with her baby in stable conditions.   SPECIMEN: Placenta, left an right fallopian tubes to pathology, umbilical cord blood to lab.   DISPOSITION: TO PACU, STABLE.   Dr. Waymon Amato.  Date:09/24/2022.  Time:11:07 PM.

## 2022-09-24 NOTE — Progress Notes (Signed)
Patient ID: [name], female   DOB: 03-15-1981, 42 y.o.   MRN: [id number]  Called by RN to do ultrasound to check fetal position as doppler for fetal heart rate had to be placed high on maternal abdomen. Bedside ultrasound done: Cephalic presentation. Dr. Waymon Amato.  09/23/2022.

## 2022-09-24 NOTE — Anesthesia Preprocedure Evaluation (Addendum)
Anesthesia Evaluation  Patient identified by MRN, date of birth, ID band Patient awake    Reviewed: Allergy & Precautions, NPO status , Patient's Chart, lab work & pertinent test results  Airway Mallampati: III  TM Distance: >3 FB Neck ROM: Full    Dental no notable dental hx.    Pulmonary asthma    Pulmonary exam normal        Cardiovascular hypertension, Pt. on medications  Rhythm:Regular Rate:Normal  Pre-E with severe   Neuro/Psych    Depression    negative neurological ROS     GI/Hepatic negative GI ROS, Neg liver ROS,,,  Endo/Other  diabetes, Type 2, Oral Hypoglycemic Agents    Renal/GU negative Renal ROS  negative genitourinary   Musculoskeletal negative musculoskeletal ROS (+)    Abdominal Normal abdominal exam  (+)   Peds  Hematology  (+) Blood dyscrasia, anemia Lab Results      Component                Value               Date                      WBC                      4.9                 09/24/2022                HGB                      11.7 (L)            09/24/2022                HCT                      35.8 (L)            09/24/2022                MCV                      88.8                09/24/2022                PLT                      263                 09/24/2022             Lab Results      Component                Value               Date                      NA                       135                 09/24/2022                K  4.0                 09/24/2022                CO2                      20 (L)              09/24/2022                GLUCOSE                  123 (H)             09/24/2022                BUN                      9                   09/24/2022                CREATININE               0.69                09/24/2022                CALCIUM                  9.4                 09/24/2022                GFRNONAA                 >60                  09/24/2022              Anesthesia Other Findings   Reproductive/Obstetrics                             Anesthesia Physical Anesthesia Plan  ASA: 3  Anesthesia Plan: Spinal   Post-op Pain Management:    Induction:   PONV Risk Score and Plan: 2 and Ondansetron and Treatment may vary due to age or medical condition  Airway Management Planned: Simple Face Mask, Natural Airway and Nasal Cannula  Additional Equipment: None  Intra-op Plan:   Post-operative Plan:   Informed Consent: I have reviewed the patients History and Physical, chart, labs and discussed the procedure including the risks, benefits and alternatives for the proposed anesthesia with the patient or authorized representative who has indicated his/her understanding and acceptance.     Dental advisory given  Plan Discussed with: CRNA  Anesthesia Plan Comments:        Anesthesia Quick Evaluation

## 2022-09-25 ENCOUNTER — Encounter (HOSPITAL_COMMUNITY): Payer: Self-pay | Admitting: Obstetrics and Gynecology

## 2022-09-25 ENCOUNTER — Other Ambulatory Visit: Payer: Self-pay

## 2022-09-25 LAB — GLUCOSE, CAPILLARY
Glucose-Capillary: 110 mg/dL — ABNORMAL HIGH (ref 70–99)
Glucose-Capillary: 169 mg/dL — ABNORMAL HIGH (ref 70–99)
Glucose-Capillary: 159 mg/dL — ABNORMAL HIGH (ref 70–99)
Glucose-Capillary: 122 mg/dL — ABNORMAL HIGH (ref 70–99)

## 2022-09-25 LAB — MAGNESIUM: Magnesium: 5.3 mg/dL — ABNORMAL HIGH (ref 1.7–2.4)

## 2022-09-25 LAB — LACTATE DEHYDROGENASE: LDH: 250 U/L — ABNORMAL HIGH (ref 98–192)

## 2022-09-25 MED ORDER — ONDANSETRON HCL 4 MG/2ML IJ SOLN
INTRAMUSCULAR | Status: AC
  Filled 2022-09-25: qty 2

## 2022-09-25 MED ORDER — DIPHENHYDRAMINE HCL 25 MG PO CAPS: 25.0000 mg | ORAL_CAPSULE | Freq: Four times a day (QID) | ORAL | Status: DC | PRN

## 2022-09-25 MED ORDER — ENOXAPARIN SODIUM 80 MG/0.8ML IJ SOSY
65.0000 mg | PREFILLED_SYRINGE | INTRAMUSCULAR | Status: DC
  Administered 2022-09-25 – 2022-09-28 (×4): 65 mg via SUBCUTANEOUS
  Filled 2022-09-25 (×4): qty 0.8

## 2022-09-25 MED ORDER — MAGNESIUM SULFATE 40 GM/1000ML IV SOLN
INTRAVENOUS | Status: AC
  Filled 2022-09-25: qty 1000

## 2022-09-25 MED ORDER — DIBUCAINE (PERIANAL) 1 % EX OINT: 1.0000 | TOPICAL_OINTMENT | CUTANEOUS | Status: DC | PRN

## 2022-09-25 MED ORDER — IBUPROFEN 600 MG PO TABS
600.0000 mg | ORAL_TABLET | Freq: Four times a day (QID) | ORAL | Status: DC
  Administered 2022-09-26 – 2022-09-29 (×12): 600 mg via ORAL
  Filled 2022-09-25 (×12): qty 1

## 2022-09-25 MED ORDER — KETOROLAC TROMETHAMINE 30 MG/ML IJ SOLN
30.0000 mg | Freq: Four times a day (QID) | INTRAMUSCULAR | Status: AC
  Administered 2022-09-25 (×4): 30 mg via INTRAVENOUS
  Filled 2022-09-25 (×4): qty 1

## 2022-09-25 MED ORDER — SIMETHICONE 80 MG PO CHEW
80.0000 mg | CHEWABLE_TABLET | Freq: Three times a day (TID) | ORAL | Status: DC
  Administered 2022-09-25 – 2022-09-29 (×13): 80 mg via ORAL
  Filled 2022-09-25 (×13): qty 1

## 2022-09-25 MED ORDER — DIPHENHYDRAMINE HCL 50 MG/ML IJ SOLN
INTRAMUSCULAR | Status: AC
  Filled 2022-09-25: qty 1

## 2022-09-25 MED ORDER — ACETAMINOPHEN 500 MG PO TABS
1000.0000 mg | ORAL_TABLET | Freq: Four times a day (QID) | ORAL | Status: DC
  Administered 2022-09-25 – 2022-09-29 (×16): 1000 mg via ORAL
  Filled 2022-09-25 (×17): qty 2

## 2022-09-25 MED ORDER — SCOPOLAMINE 1 MG/3DAYS TD PT72
MEDICATED_PATCH | TRANSDERMAL | Status: AC
  Filled 2022-09-25: qty 1

## 2022-09-25 MED ORDER — HYDROMORPHONE HCL 1 MG/ML IJ SOLN
0.2000 mg | INTRAMUSCULAR | Status: DC | PRN
  Filled 2022-09-25: qty 1

## 2022-09-25 MED ORDER — MENTHOL 3 MG MT LOZG: 1.0000 | LOZENGE | OROMUCOSAL | Status: DC | PRN

## 2022-09-25 MED ORDER — MAGNESIUM SULFATE BOLUS VIA INFUSION
4.0000 g | Freq: Once | INTRAVENOUS | Status: AC
  Administered 2022-09-25: 4 g via INTRAVENOUS
  Filled 2022-09-25: qty 1000

## 2022-09-25 MED ORDER — MAGNESIUM SULFATE 40 GM/1000ML IV SOLN: 2.0000 g/h | INTRAVENOUS | Status: DC

## 2022-09-25 MED ORDER — LACTATED RINGERS IV SOLN: INTRAVENOUS | Status: DC

## 2022-09-25 MED ORDER — WITCH HAZEL-GLYCERIN EX PADS: 1.0000 | MEDICATED_PAD | CUTANEOUS | Status: DC | PRN

## 2022-09-25 MED ORDER — OXYCODONE HCL 5 MG PO TABS
5.0000 mg | ORAL_TABLET | ORAL | Status: DC | PRN
  Administered 2022-09-26 (×2): 5 mg via ORAL
  Filled 2022-09-25 (×2): qty 1

## 2022-09-25 MED ORDER — LABETALOL HCL 100 MG PO TABS: 100.0000 mg | ORAL_TABLET | Freq: Two times a day (BID) | ORAL | Status: DC

## 2022-09-25 MED ORDER — PRENATAL MULTIVITAMIN CH
1.0000 | ORAL_TABLET | Freq: Every day | ORAL | Status: DC
  Administered 2022-09-25 – 2022-09-28 (×4): 1 via ORAL
  Filled 2022-09-25 (×5): qty 1

## 2022-09-25 MED ORDER — SIMETHICONE 80 MG PO CHEW
80.0000 mg | CHEWABLE_TABLET | ORAL | Status: DC | PRN
  Administered 2022-09-26: 80 mg via ORAL
  Filled 2022-09-25: qty 1

## 2022-09-25 MED ORDER — HYDROXYZINE HCL 50 MG PO TABS: 25.0000 mg | ORAL_TABLET | Freq: Four times a day (QID) | ORAL | Status: DC | PRN

## 2022-09-25 MED ORDER — MAGNESIUM SULFATE BOLUS VIA INFUSION: 4.0000 g | Freq: Once | INTRAVENOUS | Status: DC

## 2022-09-25 MED ORDER — ZOLPIDEM TARTRATE 5 MG PO TABS: 5.0000 mg | ORAL_TABLET | Freq: Every evening | ORAL | Status: DC | PRN

## 2022-09-25 MED ORDER — COCONUT OIL OIL: 1.0000 | TOPICAL_OIL | Status: DC | PRN

## 2022-09-25 MED ORDER — KETOROLAC TROMETHAMINE 30 MG/ML IJ SOLN
INTRAMUSCULAR | Status: AC
  Filled 2022-09-25: qty 1

## 2022-09-25 MED ORDER — SENNOSIDES-DOCUSATE SODIUM 8.6-50 MG PO TABS
2.0000 | ORAL_TABLET | Freq: Every day | ORAL | Status: DC
  Administered 2022-09-26 – 2022-09-29 (×4): 2 via ORAL
  Filled 2022-09-25 (×4): qty 2

## 2022-09-25 MED ORDER — OXYTOCIN-SODIUM CHLORIDE 30-0.9 UT/500ML-% IV SOLN: 2.5000 [IU]/h | INTRAVENOUS | Status: AC

## 2022-09-25 NOTE — Transfer of Care (Signed)
Immediate Anesthesia Transfer of Care Note  Patient: Rogene Meth  Procedure(s) Performed: CESAREAN SECTION WITH BILATERAL TUBAL LIGATION (Bilateral)  Patient Location: PACU  Anesthesia Type:Spinal  Level of Consciousness: awake, alert , and oriented  Airway & Oxygen Therapy: Patient Spontanous Breathing  Post-op Assessment: Report given to RN and Post -op Vital signs reviewed and stable  Post vital signs: Reviewed and stable  Last Vitals:  Vitals Value Taken Time  BP 150/96 09/25/22 0111  Temp    Pulse 85   Resp 17 09/25/22 0114  SpO2    Vitals shown include unvalidated device data.  Last Pain:  Vitals:   09/24/22 2040  TempSrc: Axillary  PainSc:       Patients Stated Pain Goal: 3 (85/02/77 4128)  Complications: No notable events documented.

## 2022-09-25 NOTE — Progress Notes (Signed)
Post Partum Day1 Subjective: Pain is well controlled, voiding and tolerating PO and breast feeding.  No headache, RUQ pain or leg pain Pt tearful and anxious not being with her baby.    Objective: Afebrile VSS BP 121/71 (BP Location: Left Arm)   Pulse 76   Temp 97.7 F (36.5 C) (Oral)   Resp 19   Ht '5\' 3"'$  (1.6 m)   Wt 129.6 kg   LMP  (LMP Unknown) Comment: ? June  SpO2 96%   Breastfeeding Unknown   BMI 50.63 kg/m    Physical Exam:  General: alert Lochia: appropriate Uterine Fundus: firm CV RRR LUNGS CTAB   DVT Evaluation: No evidence of DVT seen on physical exam.  Recent Labs    09/24/22 0536 09/24/22 1801  HGB 11.1* 11.7*  HCT 32.7* 35.8*    Assessment/Plan: PPD POD1 CS BTL PT STABLE. WITH GOOD UO AND BP CONTROL LABETALOL DOSE DECREASED WILL CHECK LABS PT IS ANXIOUS.  CONTINUE ZOLOFT AND ATARAX AS NEEDED DAILY WEIGHTS BS MONITORED.  CONTINUE METFORMIN PtBOTTLE  feeding CONTINUE CARE   LOS: 13 days   Daaron Dimarco A Dayzee Trower 09/25/2022, 3:13 PM

## 2022-09-25 NOTE — Anesthesia Postprocedure Evaluation (Signed)
Anesthesia Post Note  Patient: [name]  Procedure(s) Performed: CESAREAN SECTION WITH BILATERAL TUBAL LIGATION (Bilateral)     Patient location during evaluation: Mother Baby Anesthesia Type: Spinal Level of consciousness: oriented and awake and alert Pain management: pain level controlled Vital Signs Assessment: post-procedure vital signs reviewed and stable Respiratory status: spontaneous breathing and respiratory function stable Cardiovascular status: blood pressure returned to baseline and stable Postop Assessment: no headache, no backache, no apparent nausea or vomiting and able to ambulate Anesthetic complications: no   No notable events documented.  Last Vitals:  Vitals:   09/25/22 0200 09/25/22 0236  BP: (!) 140/97 137/83  Pulse: 80   Resp: (!) 23 16  Temp:  36.5 C  SpO2: 95% 98%    Last Pain:  Vitals:   09/25/22 0236  TempSrc: Oral  PainSc: 0-No pain                 [name]

## 2022-09-25 NOTE — Progress Notes (Signed)
Patient screened out for psychosocial assessment since none of the following apply: Psychosocial stressors documented in mother or baby's chart Gestation less than 32 weeks Code at delivery  Infant with anomalies  MOB was referred for history of depression/anxiety. * Referral screened out by Clinical Social Worker because none of the following criteria appear to apply: ~ History of anxiety/depression during this pregnancy, or of post-partum depression following prior delivery. ~ Diagnosis of anxiety and/or depression within last 3 years OR * MOB's symptoms currently being treated with medication and/or therapy. MOB has active Rx for Prozac. No concerns noted in OB records.  Please contact the Clinical Social Worker if needs arise, by [facility] request, or if MOB scores greater than 9/yes to question 10 on Edinburgh Postpartum Depression Screen.   Laurey Arrow, MSW, LCSW Clinical Social Work [phone]

## 2022-09-26 LAB — CBC
HCT: 29.4 % — ABNORMAL LOW (ref 36.0–46.0)
Hemoglobin: 9.4 g/dL — ABNORMAL LOW (ref 12.0–15.0)
MCH: 29.7 pg (ref 26.0–34.0)
MCHC: 32 g/dL (ref 30.0–36.0)
MCV: 92.7 fL (ref 80.0–100.0)
Platelets: 208 10*3/uL (ref 150–400)
RBC: 3.17 MIL/uL — ABNORMAL LOW (ref 3.87–5.11)
RDW: 13.2 % (ref 11.5–15.5)
WBC: 7.1 10*3/uL (ref 4.0–10.5)
nRBC: 0 % (ref 0.0–0.2)

## 2022-09-26 LAB — PROTEIN / CREATININE RATIO, URINE
Creatinine, Urine: 101 mg/dL
Protein Creatinine Ratio: 0.57 mg/mg{Cre} — ABNORMAL HIGH (ref 0.00–0.15)
Total Protein, Urine: 58 mg/dL

## 2022-09-26 LAB — COMPREHENSIVE METABOLIC PANEL
ALT: 68 U/L — ABNORMAL HIGH (ref 0–44)
AST: 43 U/L — ABNORMAL HIGH (ref 15–41)
Albumin: 2.7 g/dL — ABNORMAL LOW (ref 3.5–5.0)
Alkaline Phosphatase: 52 U/L (ref 38–126)
Anion gap: 12 (ref 5–15)
BUN: 8 mg/dL (ref 6–20)
CO2: 21 mmol/L — ABNORMAL LOW (ref 22–32)
Calcium: 7.9 mg/dL — ABNORMAL LOW (ref 8.9–10.3)
Chloride: 102 mmol/L (ref 98–111)
Creatinine, Ser: 0.83 mg/dL (ref 0.44–1.00)
GFR, Estimated: >60 mL/min (ref >60)
Glucose, Bld: 129 mg/dL — ABNORMAL HIGH (ref 70–99)
Potassium: 3.7 mmol/L (ref 3.5–5.1)
Sodium: 135 mmol/L (ref 135–145)
Total Bilirubin: 0.2 mg/dL — ABNORMAL LOW (ref 0.3–1.2)
Total Protein: 5.6 g/dL — ABNORMAL LOW (ref 6.5–8.1)

## 2022-09-26 LAB — URINALYSIS, ROUTINE W REFLEX MICROSCOPIC
Bacteria, UA: NONE SEEN
Bilirubin Urine: NEGATIVE
Glucose, UA: NEGATIVE mg/dL
Ketones, ur: NEGATIVE mg/dL
Leukocytes,Ua: NEGATIVE
Nitrite: NEGATIVE
Protein, ur: NEGATIVE mg/dL
Specific Gravity, Urine: 1.012 (ref 1.005–1.030)
pH: 5 (ref 5.0–8.0)

## 2022-09-26 LAB — GLUCOSE, CAPILLARY
Glucose-Capillary: 135 mg/dL — ABNORMAL HIGH (ref 70–99)
Glucose-Capillary: 97 mg/dL (ref 70–99)
Glucose-Capillary: 116 mg/dL — ABNORMAL HIGH (ref 70–99)

## 2022-09-26 LAB — LACTATE DEHYDROGENASE: LDH: 231 U/L — ABNORMAL HIGH (ref 98–192)

## 2022-09-26 LAB — URINE CULTURE: Culture: NO GROWTH

## 2022-09-26 NOTE — Lactation Note (Signed)
Lactation Consultation Note  Patient Name: [name] [id number]'E Date: 09/26/2022   Age:42 y.o. Mom's choice of feeding is formula. Maternal Data    Feeding    LATCH Score                    Lactation Tools Discussed/Used    Interventions    Discharge    Consult Status Consult Status: Complete    [name] G 09/26/2022, 7:12 PM

## 2022-09-26 NOTE — Progress Notes (Addendum)
Subjective: Postpartum Day 2: Cesarean Delivery Patient reports abdominal pain, feels like she has a lot of gas that needs to be released. She is tolerating a regular diet.  She denies nausea/vomiting. She is ambulating without difficulty.    Objective: Vital signs in last 24 hours: Temp:  [97.7 F (36.5 C)-98.6 F (37 C)] 98.6 F (37 C) (01/25 1115) Pulse Rate:  [82-101] 95 (01/25 1115) Resp:  [17-19] 18 (01/25 1115) BP: (121-136)/(58-91) 136/83 (01/25 1115) SpO2:  [97 %-100 %] 97 % (01/25 1115) Weight:  [129.1 kg] 129.1 kg (01/25 0621) I/O last 3 completed shifts: In: 6160.1 [P.O.:2080; I.V.:4080.1] Out: 4502 [Urine:4025; Blood:477]  Physical Exam:  General: alert, cooperative, and no distress Lochia: appropriate Uterine Fundus: firm Incision: With Prevena Dressing, Clean, dry and intact.  DVT Evaluation: Calf/Ankle edema is present 1+ bilaterally.   Recent Labs    09/24/22 1801 09/26/22 0509  HGB 11.7* 9.4*  HCT 35.8* 29.4*   Ref Range & Units 13:20 (09/26/22) 07:51 (09/26/22) 1 d ago (09/25/22) 1 d ago (09/25/22) 1 d ago (09/25/22) 1 d ago (09/25/22) 2 d ago (09/24/22)   Glucose-Capillary 70 - 99 mg/dL 97 135 High  CM 122 High  CM 159 High  CM 169 High  CM 110 High  CM 82 CM   Current Facility-Administered Medications:    acetaminophen (TYLENOL) tablet 1,000 mg, 1,000 mg, Oral, Q6H, Alesia Richards, Ether Goebel, MD, 1,000 mg at 09/26/22 1309   acetaminophen (TYLENOL) tablet 650 mg, 650 mg, Oral, Q4H PRN, Everett Graff, MD   butalbital-acetaminophen-caffeine (FIORICET) [phone] MG per tablet 1 tablet, 1 tablet, Oral, Q6H PRN, Everett Graff, MD   calcium carbonate (TUMS - dosed in mg elemental calcium) chewable tablet 400 mg of elemental calcium, 2 tablet, Oral, Q4H PRN, Everett Graff, MD, 400 mg of elemental calcium at 09/23/22 1701   coconut oil, 1 Application, Topical, PRN, Waymon Amato, MD   witch hazel-glycerin (TUCKS) pad 1 Application, 1 Application, Topical, PRN **AND**  dibucaine (NUPERCAINAL) 1 % rectal ointment 1 Application, 1 Application, Rectal, PRN, Alesia Richards, Zayonna Ayuso, MD   diphenhydrAMINE (BENADRYL) capsule 25 mg, 25 mg, Oral, Q6H PRN, Alesia Richards, Lagena Strand, MD   docusate sodium (COLACE) capsule 100 mg, 100 mg, Oral, Daily, Everett Graff, MD, 100 mg at 09/26/22 1041   enoxaparin (LOVENOX) injection 65 mg, 65 mg, Subcutaneous, Q24H, Alesia Richards, Gredmarie Delange, MD, 65 mg at 09/25/22 2341   fluticasone (FLONASE) 50 MCG/ACT nasal spray 1 spray, 1 spray, Each Nare, Daily PRN, Everett Graff, MD, 1 spray at 09/13/22 1558   labetalol (NORMODYNE) injection 20 mg, 20 mg, Intravenous, PRN, 20 mg at 09/24/22 2132 **AND** labetalol (NORMODYNE) injection 40 mg, 40 mg, Intravenous, PRN **AND** labetalol (NORMODYNE) injection 80 mg, 80 mg, Intravenous, PRN **AND** hydrALAZINE (APRESOLINE) injection 10 mg, 10 mg, Intravenous, PRN **AND** Measure blood pressure, , , Once, Everett Graff, MD   HYDROmorphone (DILAUDID) injection 0.2-0.6 mg, 0.2-0.6 mg, Intravenous, Q2H PRN, Waymon Amato, MD   hydrOXYzine (ATARAX) tablet 25 mg, 25 mg, Oral, Q6H PRN, Dillard, Naima, MD   [COMPLETED] ketorolac (TORADOL) 30 MG/ML injection 30 mg, 30 mg, Intravenous, Q6H, 30 mg at 09/25/22 2338 **FOLLOWED BY** ibuprofen (ADVIL) tablet 600 mg, 600 mg, Oral, Q6H, Saadia Dewitt, MD, 600 mg at 09/26/22 1308   labetalol (NORMODYNE) tablet 100 mg, 100 mg, Oral, BID, Dillard, Naima, MD   lactated ringers infusion, , Intravenous, Continuous, Dillard, Naima, MD, Last Rate: 125 mL/hr at 09/26/22 1647, New Bag at 09/26/22 1647   loratadine (CLARITIN) tablet 10 mg,  10 mg, Oral, Daily, Everett Graff, MD, 10 mg at 09/26/22 1041   menthol-cetylpyridinium (CEPACOL) lozenge 3 mg, 1 lozenge, Oral, Q2H PRN, Waymon Amato, MD   metFORMIN (GLUCOPHAGE) tablet 1,000 mg, 1,000 mg, Oral, Q supper, Everett Graff, MD, 1,000 mg at 09/26/22 1644   metFORMIN (GLUCOPHAGE) tablet 500 mg, 500 mg, Oral, Q breakfast, Everett Graff, MD, 500 mg at 09/26/22 0855    naloxone [facility]) injection 0.4 mg, 0.4 mg, Intravenous, PRN **AND** sodium chloride flush (NS) 0.9 % injection 3 mL, 3 mL, Intravenous, PRN, Stoltzfus, Gregory P, DO   naloxone HCl (NARCAN) 2 mg in dextrose 5 % 250 mL infusion, 1-4 mcg/kg/hr, Intravenous, Continuous PRN, Stoltzfus, Gregory P, DO   ondansetron (ZOFRAN) injection 4 mg, 4 mg, Intravenous, Q6H PRN, Everett Graff, MD   Oral care mouth rinse, 15 mL, Mouth Rinse, PRN, Everett Graff, MD   oxyCODONE (Oxy IR/ROXICODONE) immediate release tablet 5-10 mg, 5-10 mg, Oral, Q4H PRN, Alesia Richards, Luke Falero, MD, 5 mg at 09/26/22 1050   prenatal multivitamin tablet 1 tablet, 1 tablet, Oral, Q1200, Waymon Amato, MD, 1 tablet at 09/26/22 1309   promethazine (PHENERGAN) suppository 25 mg, 25 mg, Rectal, Daily, Everett Graff, MD, 25 mg at 09/24/22 7893   promethazine (PHENERGAN) suppository 25 mg, 25 mg, Rectal, BID PRN, Everett Graff, MD, 25 mg at 09/23/22 2003   scopolamine (TRANSDERM-SCOP) 1 MG/3DAYS 1.5 mg, 1 patch, Transdermal, Once, Stoltzfus, Gregory P, DO   senna-docusate (Senokot-S) tablet 2 tablet, 2 tablet, Oral, Daily, Waymon Amato, MD, 2 tablet at 09/26/22 1041   sertraline (ZOLOFT) tablet 50 mg, 50 mg, Oral, Daily, Everett Graff, MD, 50 mg at 09/26/22 1041   simethicone (MYLICON) chewable tablet 80 mg, 80 mg, Oral, TID PC, Alesia Richards, Rieley Hausman, MD, 80 mg at 09/26/22 1309   simethicone (MYLICON) chewable tablet 80 mg, 80 mg, Oral, PRN, Waymon Amato, MD, 80 mg at 09/26/22 8101   zolpidem (AMBIEN) tablet 5 mg, 5 mg, Oral, QHS PRN, Waymon Amato, MD   Assessment/Plan: 42 y/o Para 5 POD # 2 after a primary Cesarean section and bilateral tubal ligation for breech presentation, Chronic HTN with superimposed preeclampsia, post op course significant for s/p 24 hours of postpartum magnesium sulfate, urine retention and with foley catheter,  - Mylicon use and ambulation encouraged.  - Pain medication as needed.  -Will remove foley catheter once patient's pain is better  controlled.  - Lovenox for DVT prophylaxis.  - Labetalol for BP control.   -Sertraline for anxiety/depression.  Archie Endo, MD.

## 2022-09-26 NOTE — Progress Notes (Signed)
CSW met with MOB at bedside to complete psychosocial assessment; however, MOB reported that she was in pain and declined to meet with CSW today. CSW will attempt to meet with MOB at a later time.   Abundio Miu, [place] Worker [facility] Cell#: [phone]

## 2022-09-27 ENCOUNTER — Ambulatory Visit: Payer: No Typology Code available for payment source

## 2022-09-27 ENCOUNTER — Other Ambulatory Visit (HOSPITAL_COMMUNITY): Payer: Self-pay

## 2022-09-27 ENCOUNTER — Other Ambulatory Visit: Payer: No Typology Code available for payment source

## 2022-09-27 LAB — COMPREHENSIVE METABOLIC PANEL
ALT: 51 U/L — ABNORMAL HIGH (ref 0–44)
AST: 33 U/L (ref 15–41)
Albumin: 2.6 g/dL — ABNORMAL LOW (ref 3.5–5.0)
Alkaline Phosphatase: 51 U/L (ref 38–126)
Anion gap: 7 (ref 5–15)
BUN: 8 mg/dL (ref 6–20)
CO2: 23 mmol/L (ref 22–32)
Calcium: 8.6 mg/dL — ABNORMAL LOW (ref 8.9–10.3)
Chloride: 106 mmol/L (ref 98–111)
Creatinine, Ser: 0.75 mg/dL (ref 0.44–1.00)
GFR, Estimated: >60 mL/min (ref >60)
Glucose, Bld: 86 mg/dL (ref 70–99)
Potassium: 3.7 mmol/L (ref 3.5–5.1)
Sodium: 136 mmol/L (ref 135–145)
Total Bilirubin: 0.3 mg/dL (ref 0.3–1.2)
Total Protein: 5.6 g/dL — ABNORMAL LOW (ref 6.5–8.1)

## 2022-09-27 LAB — CBC
HCT: 26.1 % — ABNORMAL LOW (ref 36.0–46.0)
Hemoglobin: 8.9 g/dL — ABNORMAL LOW (ref 12.0–15.0)
MCH: 30.2 pg (ref 26.0–34.0)
MCHC: 34.1 g/dL (ref 30.0–36.0)
MCV: 88.5 fL (ref 80.0–100.0)
Platelets: 240 10*3/uL (ref 150–400)
RBC: 2.95 MIL/uL — ABNORMAL LOW (ref 3.87–5.11)
RDW: 13.3 % (ref 11.5–15.5)
WBC: 6.7 10*3/uL (ref 4.0–10.5)
nRBC: 0 % (ref 0.0–0.2)

## 2022-09-27 LAB — GLUCOSE, CAPILLARY
Glucose-Capillary: 82 mg/dL (ref 70–99)
Glucose-Capillary: 100 mg/dL — ABNORMAL HIGH (ref 70–99)
Glucose-Capillary: 134 mg/dL — ABNORMAL HIGH (ref 70–99)
Glucose-Capillary: 127 mg/dL — ABNORMAL HIGH (ref 70–99)

## 2022-09-27 LAB — LACTATE DEHYDROGENASE: LDH: 191 U/L (ref 98–192)

## 2022-09-27 MED ORDER — METFORMIN HCL 1000 MG PO TABS
1000.0000 mg | ORAL_TABLET | Freq: Every day | ORAL | 0 refills | Status: DC
  Filled 2022-09-27: qty 60, 60d supply, fill #0

## 2022-09-27 MED ORDER — IBUPROFEN 600 MG PO TABS
600.0000 mg | ORAL_TABLET | Freq: Four times a day (QID) | ORAL | 0 refills | Status: DC
  Filled 2022-09-27: qty 30, 8d supply, fill #0

## 2022-09-27 MED ORDER — LABETALOL HCL 200 MG PO TABS
200.0000 mg | ORAL_TABLET | Freq: Once | ORAL | Status: AC
  Administered 2022-09-27: 200 mg via ORAL
  Filled 2022-09-27: qty 1

## 2022-09-27 MED ORDER — LABETALOL HCL 100 MG PO TABS
100.0000 mg | ORAL_TABLET | Freq: Two times a day (BID) | ORAL | Status: DC
  Administered 2022-09-27: 100 mg via ORAL
  Filled 2022-09-27: qty 1

## 2022-09-27 MED ORDER — LABETALOL HCL 200 MG PO TABS: 200.0000 mg | ORAL_TABLET | Freq: Two times a day (BID) | ORAL | Status: DC

## 2022-09-27 MED ORDER — METFORMIN HCL 500 MG PO TABS
500.0000 mg | ORAL_TABLET | Freq: Every day | ORAL | 0 refills | Status: DC
  Filled 2022-09-27: qty 60, 60d supply, fill #0

## 2022-09-27 MED ORDER — FLUOXETINE HCL 20 MG PO CAPS
20.0000 mg | ORAL_CAPSULE | Freq: Every morning | ORAL | 0 refills | Status: AC
  Filled 2022-09-27 (×2): qty 60, 60d supply, fill #0

## 2022-09-27 MED ORDER — POLYSACCHARIDE IRON COMPLEX 150 MG PO CAPS
150.0000 mg | ORAL_CAPSULE | Freq: Every day | ORAL | 0 refills | Status: DC
  Filled 2022-09-27: qty 60, 60d supply, fill #0

## 2022-09-27 MED ORDER — LABETALOL HCL 200 MG PO TABS
200.0000 mg | ORAL_TABLET | Freq: Two times a day (BID) | ORAL | Status: DC
  Filled 2022-09-27: qty 1

## 2022-09-27 MED ORDER — POLYSACCHARIDE IRON COMPLEX 150 MG PO CAPS
150.0000 mg | ORAL_CAPSULE | Freq: Every day | ORAL | Status: DC
  Administered 2022-09-27 – 2022-09-29 (×3): 150 mg via ORAL
  Filled 2022-09-27 (×3): qty 1

## 2022-09-27 MED ORDER — LABETALOL HCL 200 MG PO TABS
200.0000 mg | ORAL_TABLET | Freq: Two times a day (BID) | ORAL | 0 refills | Status: DC
  Filled 2022-09-27: qty 120, 60d supply, fill #0

## 2022-09-27 MED ORDER — DOXYLAMINE SUCCINATE (SLEEP) 25 MG PO TABS
25.0000 mg | ORAL_TABLET | Freq: Every evening | ORAL | Status: DC | PRN
  Administered 2022-09-27 – 2022-09-29 (×2): 25 mg via ORAL
  Filled 2022-09-27 (×2): qty 1

## 2022-09-27 MED ORDER — OXYCODONE HCL 5 MG PO TABS
5.0000 mg | ORAL_TABLET | ORAL | 0 refills | Status: DC | PRN
  Filled 2022-09-27: qty 30, 3d supply, fill #0

## 2022-09-27 NOTE — Progress Notes (Signed)
Patient declining labetalol due to fear of severe drop in blood pressure.  Current BP is 154/107.  Foley catheter removed 9:30 pm last night and patient has voided nearly 3000 ml.  IVF had been running at 125 ml/hr, but IV infiltrated and was removed this morning.  Dr. Alesia Richards on floor and above information discussed with her.  She went to talk to patient in NICU where patient was visiting her baby.  Dr. Alesia Richards called this RN to say that patient has agreed to take 100 mg labetalol BID and will take first dose when she comes back to her room.  See verbal order for new dosage of labetalol.

## 2022-09-27 NOTE — Progress Notes (Signed)
Subjective: Postpartum Day 3: Cesarean Delivery Patient reports  feeling better. She is tolerating a regular diet and passing flatus. Her abdominal pain is well controlled. She is ambulating and voiding without difficulty.   Objective: Vital signs in last 24 hours: Temp:  [97.9 F (36.6 C)-98.9 F (37.2 C)] 98.3 F (36.8 C) (01/26 0820) Pulse Rate:  [92-110] 103 (01/26 0820) Resp:  [18-20] 18 (01/26 0820) BP: (136-154)/(68-107) 154/107 (01/26 0820) SpO2:  [97 %-100 %] 97 % (01/26 0436) Weight:  [130.5 kg] 130.5 kg (01/26 0436)    09/27/2022    8:20 AM 09/27/2022    4:36 AM 09/26/2022   11:40 PM  Vitals with BMI  Weight  287 lbs 13 oz   BMI  74.25   Systolic 956 387 564  Diastolic 332 98 68  Pulse 103 96 94    I/O last 3 completed shifts: In: 6532.5 [P.O.:2940; I.V.:3592.5] Out: 4650 [Urine:4650]    Intake/Output Summary (Last 24 hours) at 09/27/2022 1003 Last data filed at 09/27/2022 0825 Gross per 24 hour  Intake 5934.53 ml  Output 3525 ml  Net 2409.53 ml     Physical Exam:  General: alert, cooperative, and no distress Lochia: appropriate Uterine Fundus: firm Incision: Clean, dry, intact. With [place].  DVT Evaluation: No evidence of DVT seen on physical exam. Calf/Ankle edema is present, 3+ bilaterally.   Recent Labs    09/26/22 0509 09/27/22 0622  HGB 9.4* 8.9*  HCT 29.4* 26.1*   CMP     Component Value Date/Time   NA 136 09/27/2022 0622   K 3.7 09/27/2022 0622   CL 106 09/27/2022 0622   CO2 23 09/27/2022 0622   GLUCOSE 86 09/27/2022 0622   BUN 8 09/27/2022 0622   CREATININE 0.75 09/27/2022 0622   CALCIUM 8.6 (L) 09/27/2022 0622   PROT 5.6 (L) 09/27/2022 0622   ALBUMIN 2.6 (L) 09/27/2022 0622   AST 33 09/27/2022 0622   ALT 51 (H) 09/27/2022 0622   ALKPHOS 51 09/27/2022 0622   BILITOT 0.3 09/27/2022 0622   GFRNONAA >60 09/27/2022 0622   GFRAA >60 12/22/2019 0555      Latest Ref Rng & Units 09/27/2022    6:22 AM 09/26/2022    5:09 AM  09/24/2022    6:01 PM  CMP  Glucose 70 - 99 mg/dL 86  129  123   BUN 6 - 20 mg/dL '8  8  9   '$ Creatinine 0.44 - 1.00 mg/dL 0.75  0.83  0.69   Sodium 135 - 145 mmol/L 136  135  135   Potassium 3.5 - 5.1 mmol/L 3.7  3.7  4.0   Chloride 98 - 111 mmol/L 106  102  102   CO2 22 - 32 mmol/L '23  21  20   '$ Calcium 8.9 - 10.3 mg/dL 8.6  7.9  9.4   Total Protein 6.5 - 8.1 g/dL 5.6  5.6  6.8   Total Bilirubin 0.3 - 1.2 mg/dL 0.3  0.2  <0.1   Alkaline Phos 38 - 126 U/L 51  52  71   AST 15 - 41 U/L 33  43  54   ALT 0 - 44 U/L 51  68  86        Ref Range & Units 10:18 (09/27/22) 07:40 (09/27/22) 1 d ago (09/26/22) 1 d ago (09/26/22) 1 d ago (09/26/22) 2 d ago (09/25/22) 2 d ago (09/25/22)   Glucose-Capillary 70 - 99 mg/dL 100 High  82 CM 116  High  CM 97 CM 135 High  CM 122 High  CM 159 High  CM   Current Facility-Administered Medications:    acetaminophen (TYLENOL) tablet 1,000 mg, 1,000 mg, Oral, Q6H, Alesia Richards, Bisma Klett, MD, 1,000 mg at 09/27/22 1407   acetaminophen (TYLENOL) tablet 650 mg, 650 mg, Oral, Q4H PRN, Everett Graff, MD   butalbital-acetaminophen-caffeine (FIORICET) [phone] MG per tablet 1 tablet, 1 tablet, Oral, Q6H PRN, Everett Graff, MD   calcium carbonate (TUMS - dosed in mg elemental calcium) chewable tablet 400 mg of elemental calcium, 2 tablet, Oral, Q4H PRN, Everett Graff, MD, 400 mg of elemental calcium at 09/23/22 1701   coconut oil, 1 Application, Topical, PRN, Waymon Amato, MD   witch hazel-glycerin (TUCKS) pad 1 Application, 1 Application, Topical, PRN **AND** dibucaine (NUPERCAINAL) 1 % rectal ointment 1 Application, 1 Application, Rectal, PRN, Alesia Richards, Ladarren Steiner, MD   diphenhydrAMINE (BENADRYL) capsule 25 mg, 25 mg, Oral, Q6H PRN, Alesia Richards, Seryna Marek, MD   docusate sodium (COLACE) capsule 100 mg, 100 mg, Oral, Daily, Everett Graff, MD, 100 mg at 09/27/22 0755   enoxaparin (LOVENOX) injection 65 mg, 65 mg, Subcutaneous, Q24H, Alesia Richards, Ronda Rajkumar, MD, 65 mg at 09/26/22 2236   fluticasone (FLONASE) 50  MCG/ACT nasal spray 1 spray, 1 spray, Each Nare, Daily PRN, Everett Graff, MD, 1 spray at 09/13/22 1558   labetalol (NORMODYNE) injection 20 mg, 20 mg, Intravenous, PRN, 20 mg at 09/24/22 2132 **AND** labetalol (NORMODYNE) injection 40 mg, 40 mg, Intravenous, PRN **AND** labetalol (NORMODYNE) injection 80 mg, 80 mg, Intravenous, PRN **AND** hydrALAZINE (APRESOLINE) injection 10 mg, 10 mg, Intravenous, PRN **AND** Measure blood pressure, , , Once, Everett Graff, MD   HYDROmorphone (DILAUDID) injection 0.2-0.6 mg, 0.2-0.6 mg, Intravenous, Q2H PRN, Waymon Amato, MD   hydrOXYzine (ATARAX) tablet 25 mg, 25 mg, Oral, Q6H PRN, Dillard, Naima, MD   [COMPLETED] ketorolac (TORADOL) 30 MG/ML injection 30 mg, 30 mg, Intravenous, Q6H, 30 mg at 09/25/22 2338 **FOLLOWED BY** ibuprofen (ADVIL) tablet 600 mg, 600 mg, Oral, Q6H, Alesia Richards, Braniya Farrugia, MD, 600 mg at 09/27/22 1407   iron polysaccharides (NIFEREX) capsule 150 mg, 150 mg, Oral, Daily, Waymon Amato, MD   [START ON 09/28/2022] labetalol (NORMODYNE) tablet 200 mg, 200 mg, Oral, BID, Ivin Poot, Eboni D, MD   lactated ringers infusion, , Intravenous, Continuous, Dillard, Naima, MD, Stopped at 09/27/22 0825   loratadine (CLARITIN) tablet 10 mg, 10 mg, Oral, Daily, Everett Graff, MD, 10 mg at 09/27/22 0754   menthol-cetylpyridinium (CEPACOL) lozenge 3 mg, 1 lozenge, Oral, Q2H PRN, Waymon Amato, MD   metFORMIN (GLUCOPHAGE) tablet 1,000 mg, 1,000 mg, Oral, Q supper, Everett Graff, MD, 1,000 mg at 09/26/22 1644   metFORMIN (GLUCOPHAGE) tablet 500 mg, 500 mg, Oral, Q breakfast, Everett Graff, MD, 500 mg at 09/27/22 0754   naloxone (NARCAN) injection 0.4 mg, 0.4 mg, Intravenous, PRN **AND** sodium chloride flush (NS) 0.9 % injection 3 mL, 3 mL, Intravenous, PRN, Stoltzfus, Gregory P, DO   naloxone HCl (NARCAN) 2 mg in dextrose 5 % 250 mL infusion, 1-4 mcg/kg/hr, Intravenous, Continuous PRN, Stoltzfus, Gregory P, DO   ondansetron (ZOFRAN) injection 4 mg, 4 mg, Intravenous, Q6H  PRN, Everett Graff, MD   Oral care mouth rinse, 15 mL, Mouth Rinse, PRN, Everett Graff, MD   oxyCODONE (Oxy IR/ROXICODONE) immediate release tablet 5-10 mg, 5-10 mg, Oral, Q4H PRN, Alesia Richards, Toy Eisemann, MD, 5 mg at 09/26/22 1050   prenatal multivitamin tablet 1 tablet, 1 tablet, Oral, Q1200, Waymon Amato, MD, 1 tablet at 09/27/22 1407   promethazine (  PHENERGAN) suppository 25 mg, 25 mg, Rectal, Daily, Everett Graff, MD, 25 mg at 09/24/22 7902   promethazine (PHENERGAN) suppository 25 mg, 25 mg, Rectal, BID PRN, Everett Graff, MD, 25 mg at 09/23/22 2003   scopolamine (TRANSDERM-SCOP) 1 MG/3DAYS 1.5 mg, 1 patch, Transdermal, Once, Stoltzfus, March Rummage, DO   senna-docusate (Senokot-S) tablet 2 tablet, 2 tablet, Oral, Daily, Waymon Amato, MD, 2 tablet at 09/27/22 0755   sertraline (ZOLOFT) tablet 50 mg, 50 mg, Oral, Daily, Everett Graff, MD, 50 mg at 09/27/22 4097   simethicone (MYLICON) chewable tablet 80 mg, 80 mg, Oral, TID PC, Waymon Amato, MD, 80 mg at 09/27/22 1407   simethicone (MYLICON) chewable tablet 80 mg, 80 mg, Oral, PRN, Waymon Amato, MD, 80 mg at 09/26/22 3532   zolpidem (AMBIEN) tablet 5 mg, 5 mg, Oral, QHS PRN, Waymon Amato, MD   Assessment/Plan: 42 y/o Para 5 POD # 3 after a primary Cesarean section and bilateral tubal ligation for breech presentation, Chronic HTN with superimposed preeclampsia, post op course significant for s/p 24 hours of postpartum magnesium sulfate, resolved urine retention, elevated blood pressures,     - Patient had been declining her blood pressure medication for the past few days.  Discussed importance of blood pressure control in postpartum period.  Patient agrees to restart oral labetalol 100 mg PO BID, will monitor BP closely.   - Lovenox for DVT prophylaxis.   -Sertraline for anxiety/depression. - Metformin for type 2 DM, pregestational.  Archie Endo, MD 09/27/2022, 10:02 AM

## 2022-09-27 NOTE — Plan of Care (Signed)
  Problem: Coping: Goal: Ability to adjust to condition or change in health will improve Outcome: Completed/Met   Problem: Nutritional: Goal: Progress toward achieving an optimal weight will improve Outcome: Completed/Met   Problem: Education: Goal: Knowledge of the prescribed therapeutic regimen will improve Outcome: Completed/Met   Problem: Education: Goal: Knowledge of condition will improve Outcome: Completed/Met   Problem: Activity: Goal: Will verbalize the importance of balancing activity with adequate rest periods Outcome: Completed/Met Goal: Ability to tolerate increased activity will improve Outcome: Completed/Met   Problem: Coping: Goal: Ability to identify and utilize available resources and services will improve Outcome: Completed/Met   Problem: Role Relationship: Goal: Ability to demonstrate positive interaction with newborn will improve Outcome: Completed/Met

## 2022-09-27 NOTE — Clinical Social Work Maternal (Signed)
CLINICAL SOCIAL WORK MATERNAL/CHILD NOTE  Patient Details  Name: [name] MRN: [id number] Date of Birth: Mar 17, 1981  Date:  09/27/2022  Clinical Social Worker Initiating Note:  Laurey Arrow Date/Time: Initiated:  09/27/22/      Child's Name:  [facility]   Biological Parents:  Mother, Father   Need for Interpreter:  None   Reason for Referral:  Behavioral Health Concerns (hx of depression)   Address:  [address] [place] [address]    Phone number:  [phone] (home)     Additional phone number: FOB's number is [phone]  Household Members/Support Persons (HM/SP):   Household Member/Support Person 1, Household Member/Support Person 2, Household Member/Support Person 3, Household Member/Support Person 4, Household Member/Support Person 5   HM/SP Name Relationship DOB or Age  HM/SP -[place] Sr FOB 03/30/1982  HM/SP -2 [place] Son 05/02/05  HM/SP -[place] son 08/02/2011  HM/SP -[place] son 04/23/2014  HM/SP -5 [place] daughter 12/21/19  HM/SP -6        HM/SP -7        HM/SP -8          Natural Supports (not living in the home):  Extended Family, Parent, Immediate Family   Professional Supports: Transport planner (Per MOB, she receives therapy at [facility].)   Employment: Part-time   Type of Work: [employer]   Education:  Nurse, adult   Homebound arranged:    Museum/gallery curator Resources:  [employer]   Other Resources:  [employer] (Per MOB, her household do not qualify for [employer].)   Cultural/Religious Considerations Which May Impact Care:  None reported  Strengths:  Ability to meet basic needs  , Compliance with medical plan  , Home prepared for child  , Understanding of illness, Psychotropic Medications, Pediatrician chosen   Psychotropic Medications:  Zoloft      Pediatrician:     ([facility] in [place], [place])  Pediatrician List:   [place]    [place]      Pediatrician Fax Number:    Risk Factors/Current Problems:  Mental Health Concerns     Cognitive State:  Able to Concentrate  , Alert  , Goal Oriented  , Insightful  , Linear Thinking     Mood/Affect:  Comfortable  , Interested  , Calm  , Happy  , Relaxed     CSW Assessment: CSW met with MOB at infant's bedside in room 321 to complete an assessment for MH hx and Edinburgh Score of 10. When CSW arrived, MOB was observing MGM and infant's interactions. CSW explained CSW's role and MOB gave CSW permission for CSW to complete assessment while MGM was present.  MGM did not engage with CSW. MOB was polite, easy to engage, and receptive to meeting with CSW.   CSW inquired about MOB's MH hx and MOB acknowledged a hx of anxiety and depression and reported that she was dx  around 2012. 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  MOB also that she attends counseling regularly and finds her family as a source of support. CSW reviewed MOB's Edinburgh Score and educated MOB about PMADs. CSW informed MOB of possible supports and interventions to decrease PPD.  CSW also encouraged MOB to  seek medical attention if needed for increased signs and symptoms of PPD.  CSW also offered MOB resources for outpatient behavioral health services and MOB declined. CSW encouraged MOB to evaluate her mental health throughout the postpartum period with the use of the New Mom Checklist developed by Postpartum Progress and notify a medical professional if symptoms arise; MOB agreed. MOB presented with insight and awareness and denied SI, HI, and DV when assessed for safety. MOB reported having a good support team that will be willing to help if needed. CSW reviewed safe sleep and SIDS. MOB and was knowledgeable and asked appropriate  questions. MOB communicated that MOB has everything she needs for the baby and is prepared to meet her infant's needs.  MOB did not have any further questions, concerns, or needs currently. CSW thanked MOB for allowing CSW to meet with her. CSW will continue to offer resources and supports to family while infant remains in NICU.   There are no barriers to discharge.  CSW Plan/Description:  Psychosocial Support and Ongoing Assessment of Needs, Sudden Infant Death Syndrome (SIDS) Education, Perinatal Mood and Anxiety Disorder (PMADs) Education, Other Patient/Family Education, Other Information/Referral to [employer], MSW, [employer] Clinical Social Work [phone]  Dimple Nanas, LCSW 09/27/2022, 12:25 PM

## 2022-09-28 LAB — GLUCOSE, CAPILLARY
Glucose-Capillary: 81 mg/dL (ref 70–99)
Glucose-Capillary: 93 mg/dL (ref 70–99)
Glucose-Capillary: 120 mg/dL — ABNORMAL HIGH (ref 70–99)
Glucose-Capillary: 105 mg/dL — ABNORMAL HIGH (ref 70–99)

## 2022-09-28 LAB — CULTURE, OB URINE: Culture: NO GROWTH

## 2022-09-28 MED ORDER — NIFEDIPINE ER OSMOTIC RELEASE 30 MG PO TB24
30.0000 mg | ORAL_TABLET | Freq: Every day | ORAL | Status: DC
  Administered 2022-09-28 – 2022-09-29 (×2): 30 mg via ORAL
  Filled 2022-09-28 (×2): qty 1

## 2022-09-28 NOTE — Progress Notes (Signed)
Subjective: Postpartum Day 4: Cesarean Delivery Patient reports incisional pain, tolerating PO, + flatus, and no problems voiding.  Pt is afraid to take the labetalol due to previous syncopal episode .   Objective: Vital signs in last 24 hours: Temp:  [97.8 F (36.6 C)-98.9 F (37.2 C)] 98.9 F (37.2 C) (01/27 0810) Pulse Rate:  [80-98] 80 (01/27 0810) Resp:  [18-20] 20 (01/27 0810) BP: (144-158)/(84-99) 144/87 (01/27 0810) SpO2:  [96 %-99 %] 96 % (01/27 0810) Weight:  [130.5 kg] 130.5 kg (01/27 0410)  Physical Exam:  General: alert, cooperative, and no distress Lochia: appropriate Uterine Fundus: firm Incision: prevena dressing in place  DVT Evaluation: No evidence of DVT seen on physical exam.  Recent Labs    09/26/22 0509 09/27/22 0622  HGB 9.4* 8.9*  HCT 29.4* 26.1*    Assessment/Plan: Status post Cesarean section. Doing well postoperatively.  Chronic hypertension with superimposed preeclampsia.Marland Kitchen d/c labetalol start procardia xl 30 mg daily .  Encourage ambulation.  Type 2 dm continue metformin.  Discharge home later today or tomorrow depending on blood pressure control .  Christophe Louis, MD 09/28/2022, 9:00 AM

## 2022-09-29 LAB — GLUCOSE, CAPILLARY
Glucose-Capillary: 90 mg/dL (ref 70–99)
Glucose-Capillary: 111 mg/dL — ABNORMAL HIGH (ref 70–99)

## 2022-09-29 LAB — SURGICAL PATHOLOGY

## 2022-09-29 MED ORDER — OXYCODONE HCL 5 MG PO TABS: 5.0000 mg | ORAL_TABLET | Freq: Four times a day (QID) | ORAL | 0 refills | Status: DC | PRN

## 2022-09-29 MED ORDER — NIFEDIPINE ER 30 MG PO TB24: 30.0000 mg | ORAL_TABLET | Freq: Every day | ORAL | 1 refills | Status: AC

## 2022-09-29 MED ORDER — IBUPROFEN 600 MG PO TABS: 600.0000 mg | ORAL_TABLET | Freq: Four times a day (QID) | ORAL | 0 refills | Status: AC | PRN

## 2022-09-29 MED ORDER — POLYSACCHARIDE IRON COMPLEX 150 MG PO CAPS: 150.0000 mg | ORAL_CAPSULE | Freq: Every day | ORAL | 0 refills | Status: DC

## 2022-09-29 MED ORDER — METFORMIN HCL 500 MG PO TABS: 500.0000 mg | ORAL_TABLET | Freq: Every day | ORAL | 0 refills | Status: DC

## 2022-09-29 MED ORDER — POLYSACCHARIDE IRON COMPLEX 150 MG PO CAPS: 150.0000 mg | ORAL_CAPSULE | Freq: Every day | ORAL | 0 refills | Status: AC

## 2022-09-29 MED ORDER — OXYCODONE HCL 5 MG PO TABS: 5.0000 mg | ORAL_TABLET | Freq: Four times a day (QID) | ORAL | 0 refills | Status: AC | PRN

## 2022-09-29 MED ORDER — METFORMIN HCL 1000 MG PO TABS: 1000.0000 mg | ORAL_TABLET | Freq: Every day | ORAL | 0 refills | Status: AC

## 2022-09-29 MED ORDER — NIFEDIPINE ER 30 MG PO TB24: 30.0000 mg | ORAL_TABLET | Freq: Every day | ORAL | 1 refills | Status: DC

## 2022-09-29 MED ORDER — IBUPROFEN 600 MG PO TABS: 600.0000 mg | ORAL_TABLET | Freq: Four times a day (QID) | ORAL | 0 refills | Status: DC | PRN

## 2022-09-29 MED ORDER — METFORMIN HCL 500 MG PO TABS: 500.0000 mg | ORAL_TABLET | Freq: Every day | ORAL | 0 refills | Status: AC

## 2022-09-29 MED ORDER — METFORMIN HCL 1000 MG PO TABS: 1000.0000 mg | ORAL_TABLET | Freq: Every day | ORAL | 0 refills | Status: DC

## 2022-09-29 NOTE — Progress Notes (Signed)
Teaching complete pt d/c with [facility] wound vac  and pt instructed on care and booklet given to pt also pt to make follow up appointment for removal

## 2022-09-29 NOTE — Discharge Summary (Signed)
Postpartum Discharge Summary  Date of Service updated 09/29/2022     Patient Name: [name] DOB: 1981-03-04 MRN: [id number]  Date of admission: 09/12/2022 Delivery date:09/24/2022  Delivering provider: Waymon Amato  Date of discharge: 09/29/2022  Admitting diagnosis: Preeclampsia [O14.90] Hypertension in pregnancy, pre-eclampsia [O14.90] Intrauterine pregnancy: [redacted]w[redacted]d    Secondary diagnosis:  Principal Problem:   Chronic hypertension with superimposed pre-eclampsia Active Problems:   Latex allergy   Depression / anxiety   BMI 50.0-59.9, adult (HCC)   Diabetes (HEtna   [redacted] weeks gestation of pregnancy   NST (non-stress test) reactive   Preeclampsia   AMA (advanced maternal age) multigravida 35+   Fibroid uterus   HSV-2 seropositive   Polyhydramnios  Additional problems: unstable lie of fetus/     Discharge diagnosis: Preterm Pregnancy Delivered, CHTN with superimposed preeclampsia, and Type 2 DM                                              Post partum procedures: Magnesium sulfate  Augmentation: Cytotec Complications: None  Hospital course: Induction of Labor With Cesarean Section   42y.o. yo [id number] admitted to the hospital 09/12/2022 for induction of labor. Patient had a labor course significant for induction with cytotec. . The patient went for cesarean section due to Malpresentation. Delivery details are as follows: Membrane Rupture Time/Date: 11:41 PM ,09/24/2022   Delivery Method:C-Section, Low Transverse  Details of operation can be found in separate operative Note.  Patient had a postpartum course complicated by elevated blood pressures. She received magnesium sulfate for seizure prophylaxis for 24 hours post delivery. She is ambulating, tolerating a regular diet, passing flatus, and urinating well.  Patient is discharged home in stable condition on 09/29/22.      Newborn Data: Birth date:09/24/2022  Birth time:11:41 PM  Gender:Female  Living  status:Living  Apgars:4 ,7  Weight:2650 g                                Magnesium Sulfate received: Yes: Seizure prophylaxis BMZ received: Yes Rhophylac:N/A MMR:N/A T-DaP: unknown Flu: N/A Transfusion:No  Physical exam  Vitals:   09/28/22 2049 09/29/22 0127 09/29/22 0534 09/29/22 0803  BP:  (!) 145/82 (!) 150/88 134/75  Pulse:  96 88 84  Resp:  20 (!) 22 20  Temp:  98.8 F (37.1 C) 98.2 F (36.8 C) 98.5 F (36.9 C)  TempSrc:  Oral Oral Oral  SpO2: 98% 100% 100% 97%  Weight:   129.3 kg   Height:       General: alert, cooperative, and no distress Lochia: appropriate Uterine Fundus: firm Incision: Prevena dressing in place and functioning  DVT Evaluation: No evidence of DVT seen on physical exam. Labs: Lab Results  Component Value Date   WBC 6.7 09/27/2022   HGB 8.9 (L) 09/27/2022   HCT 26.1 (L) 09/27/2022   MCV 88.5 09/27/2022   PLT 240 09/27/2022      Latest Ref Rng & Units 09/27/2022    6:22 AM  CMP  Glucose 70 - 99 mg/dL 86   BUN 6 - 20 mg/dL 8   Creatinine 0.44 - 1.00 mg/dL 0.75   Sodium 135 - 145 mmol/L 136   Potassium 3.5 - 5.1 mmol/L 3.7   Chloride 98 -  111 mmol/L 106   CO2 22 - 32 mmol/L 23   Calcium 8.9 - 10.3 mg/dL 8.6   Total Protein 6.5 - 8.1 g/dL 5.6   Total Bilirubin 0.3 - 1.2 mg/dL 0.3   Alkaline Phos 38 - 126 U/L 51   AST 15 - 41 U/L 33   ALT 0 - 44 U/L 51    Edinburgh Score:    09/25/2022    9:00 AM  Edinburgh Postnatal Depression Scale Screening Tool  I have been able to laugh and see the funny side of things. 0  I have looked forward with enjoyment to things. 0  I have blamed myself unnecessarily when things went wrong. 2  I have been anxious or worried for no good reason. 0  I have felt scared or panicky for no good reason. 2  Things have been getting on top of me. 2  I have been so unhappy that I have had difficulty sleeping. 2  I have felt sad or miserable. 1  I have been so unhappy that I have been crying. 1  The thought of  harming myself has occurred to me. 0  Edinburgh Postnatal Depression Scale Total 10      After visit meds:  Allergies as of 09/29/2022       Reactions   Flagyl [metronidazole Hcl] Swelling   Latex Hives   Other Other (See Comments)   Pt has a reaction to plastic tape. She breaks out with a rash and her skin peels.        Medication List     STOP taking these medications    acetaminophen 325 MG tablet Commonly known as: TYLENOL   Doxylamine-Pyridoxine 10-10 MG Tbec   loratadine-pseudoephedrine 5-120 MG tablet Commonly known as: CLARITIN-D 12-hour   metFORMIN 500 MG 24 hr tablet Commonly known as: GLUCOPHAGE-XR Replaced by: metFORMIN 500 MG tablet   metoCLOPramide 10 MG tablet Commonly known as: REGLAN   ondansetron 4 MG disintegrating tablet Commonly known as: ZOFRAN-ODT   promethazine 12.5 MG tablet Commonly known as: PHENERGAN   promethazine 25 MG suppository Commonly known as: PHENERGAN       TAKE these medications    FLUoxetine 20 MG capsule Commonly known as: PROZAC Take 1 capsule (20 mg total) by mouth every morning.   ibuprofen 600 MG tablet Commonly known as: ADVIL Take 1 tablet (600 mg total) by mouth every 6 (six) hours as needed.   iron polysaccharides 150 MG capsule Commonly known as: NIFEREX Take 1 capsule (150 mg total) by mouth daily.   metFORMIN 500 MG tablet Commonly known as: GLUCOPHAGE Take 1 tablet (500 mg total) by mouth daily with breakfast. Replaces: metFORMIN 500 MG 24 hr tablet   metFORMIN 1000 MG tablet Commonly known as: GLUCOPHAGE Take 1 tablet (1,000 mg total) by mouth daily with supper.   NIFEdipine 30 MG 24 hr tablet Commonly known as: ADALAT CC Take 1 tablet (30 mg total) by mouth daily. Start taking on: September 30, 2022 What changed:  medication strength how much to take   oxyCODONE 5 MG immediate release tablet Commonly known as: Oxy IR/ROXICODONE Take 1-2 tablets (5-10 mg total) by mouth every 6 (six)  hours as needed for moderate pain.   prenatal multivitamin Tabs tablet Take 1 tablet by mouth daily.         Discharge home in stable condition Infant Feeding: Bottle and Breast Infant Disposition:NICU Discharge instruction: per After Visit Summary and Postpartum booklet. Activity: Advance as tolerated.  Pelvic rest for 6 weeks.  Diet: carb modified diet Anticipated Birth Control:  salpingectomy at time of cesarean section  Postpartum Appointment:1 week Additional Postpartum F/U: Postpartum Depression checkup, Incision check 1 week, and BP check 1 week Future Appointments:No future appointments. Follow up Visit:  Follow-up Information     Waymon Amato, MD. Schedule an appointment as soon as possible for a visit in 6 week(s).   Specialty: Obstetrics and Gynecology Why: Postpartum check. Contact information: [address] [place] [address] [phone]         [facility]. Schedule an appointment as soon as possible for a visit in 1 week(s).   Why: Blood pressure check and incision check. Contact information: [address] [place] [address] [phone]                    09/29/2022 Christophe Louis, MD

## 2022-10-02 ENCOUNTER — Telehealth (HOSPITAL_COMMUNITY): Payer: Self-pay | Admitting: *Deleted

## 2022-10-02 NOTE — Telephone Encounter (Signed)
Left phone voicemail message.  Odis Hollingshead, RN 10-02-2022 at 4:35pm

## 2022-10-02 NOTE — Telephone Encounter (Signed)
Mom reports feeling good. Incision healing well per mom. No concerns regarding herself at this time. Driving to hospital to see baby in NICU now, but would like a call back in 30 minutes for EPDS (hospital score=10)  Odis Hollingshead, RN 10-02-2022 at 4:14pm

## 2022-10-04 ENCOUNTER — Ambulatory Visit: Payer: No Typology Code available for payment source
